# Patient Record
Sex: Female | Born: 1955
Health system: Southern US, Community
[De-identification: ages and names within clinical notes are randomized; demographics above are authoritative.]

## PROBLEM LIST (undated history)

## (undated) DIAGNOSIS — I10 Essential (primary) hypertension: Secondary | ICD-10-CM

## (undated) DIAGNOSIS — E7800 Pure hypercholesterolemia, unspecified: Secondary | ICD-10-CM

## (undated) DIAGNOSIS — K5792 Diverticulitis of intestine, part unspecified, without perforation or abscess without bleeding: Secondary | ICD-10-CM

## (undated) DIAGNOSIS — K219 Gastro-esophageal reflux disease without esophagitis: Secondary | ICD-10-CM

## (undated) DIAGNOSIS — K922 Gastrointestinal hemorrhage, unspecified: Secondary | ICD-10-CM

## (undated) HISTORY — PX: COLOSTOMY REVERSAL: SHX5782

## (undated) HISTORY — DX: Pure hypercholesterolemia, unspecified: E78.00

## (undated) HISTORY — PX: COLOSTOMY: SHX63

## (undated) HISTORY — PX: APPENDECTOMY: SHX54

## (undated) HISTORY — PX: OTHER SURGICAL HISTORY: SHX169

## (undated) HISTORY — PX: CHOLECYSTECTOMY: SHX55

## (undated) HISTORY — DX: Diverticulitis of intestine, part unspecified, without perforation or abscess without bleeding: K57.92

---

## 1999-12-20 ENCOUNTER — Encounter: Payer: Self-pay | Admitting: Emergency Medicine

## 1999-12-20 ENCOUNTER — Emergency Department (HOSPITAL_COMMUNITY): Admission: EM | Admit: 1999-12-20 | Discharge: 1999-12-20 | Payer: Self-pay

## 2002-12-14 ENCOUNTER — Other Ambulatory Visit: Admission: RE | Admit: 2002-12-14 | Discharge: 2002-12-14 | Payer: Self-pay | Admitting: Family Medicine

## 2002-12-27 ENCOUNTER — Encounter: Admission: RE | Admit: 2002-12-27 | Discharge: 2002-12-27 | Payer: Self-pay | Admitting: Family Medicine

## 2003-01-02 ENCOUNTER — Encounter
Admission: RE | Admit: 2003-01-02 | Discharge: 2003-04-02 | Payer: Self-pay | Admitting: Physical Medicine and Rehabilitation

## 2003-04-03 ENCOUNTER — Encounter
Admission: RE | Admit: 2003-04-03 | Discharge: 2003-07-02 | Payer: Self-pay | Admitting: Physical Medicine and Rehabilitation

## 2003-07-03 ENCOUNTER — Encounter
Admission: RE | Admit: 2003-07-03 | Discharge: 2003-10-01 | Payer: Self-pay | Admitting: Physical Medicine and Rehabilitation

## 2003-10-08 ENCOUNTER — Encounter
Admission: RE | Admit: 2003-10-08 | Discharge: 2003-12-02 | Payer: Self-pay | Admitting: Physical Medicine and Rehabilitation

## 2003-10-09 ENCOUNTER — Ambulatory Visit: Payer: Self-pay | Admitting: Physical Medicine and Rehabilitation

## 2003-12-02 ENCOUNTER — Encounter
Admission: RE | Admit: 2003-12-02 | Discharge: 2004-03-01 | Payer: Self-pay | Admitting: Physical Medicine and Rehabilitation

## 2003-12-04 ENCOUNTER — Ambulatory Visit: Payer: Self-pay | Admitting: Physical Medicine and Rehabilitation

## 2004-02-07 ENCOUNTER — Ambulatory Visit: Payer: Self-pay | Admitting: Physical Medicine and Rehabilitation

## 2004-03-27 ENCOUNTER — Ambulatory Visit: Payer: Self-pay | Admitting: Internal Medicine

## 2004-03-29 ENCOUNTER — Inpatient Hospital Stay (HOSPITAL_COMMUNITY): Admission: EM | Admit: 2004-03-29 | Discharge: 2004-04-01 | Payer: Self-pay | Admitting: Emergency Medicine

## 2004-03-31 ENCOUNTER — Encounter (INDEPENDENT_AMBULATORY_CARE_PROVIDER_SITE_OTHER): Payer: Self-pay | Admitting: Specialist

## 2005-09-23 ENCOUNTER — Encounter: Admission: RE | Admit: 2005-09-23 | Discharge: 2005-09-23 | Payer: Self-pay | Admitting: Family Medicine

## 2005-12-08 ENCOUNTER — Ambulatory Visit: Payer: Self-pay

## 2007-11-15 ENCOUNTER — Encounter: Admission: RE | Admit: 2007-11-15 | Discharge: 2007-11-15 | Payer: Self-pay | Admitting: Family Medicine

## 2008-04-25 ENCOUNTER — Encounter: Admission: RE | Admit: 2008-04-25 | Discharge: 2008-04-25 | Payer: Self-pay | Admitting: Family Medicine

## 2010-06-05 NOTE — Assessment & Plan Note (Signed)
[name] is a 55 year old, married, white female who is being followed in  our pain and rehabilitative clinic today for chronic leg pain, wrist pain,  and intermittent back pain.  She has been quite stable taking 5 mg of  methadone every day as well as Neurontin.  She stays very functional.  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  She is sometimes on  her feet for five hours at a time.  She is independent with all of her ADLs.  She is independent with her mobility.  She is able to drive.  Overall, her  sleep is good.  Denies any suicidal ideation.   Only recent change in her medical history is increased cholesterol which her  primary care physician is watching and may start her on some medication.   She was given some Elavil at the last visit.  She has had it filled but has  not taken it, has some concerns regarding it, has some questions.  I  answered them today in clinic.   PHYSICAL EXAMINATION:  VITAL SIGNS:  She has a blood pressure of 110/57,  pulse is 64, respirations 14, 99% saturated on room air.  AFFECT:  Alert, oriented.  She was dressed well.  She is cooperative.  MUSCULOSKELETAL:  She is able to get out of her chair without difficulty.  Her gait in the room is unchanged from the last two visits.  She has just a  slight foot drop on the left when she walks.  Seated, her reflexes are 2+ at  the knees and ankles.  Toes are downgoing.  No clonus is noted.  She has  diminished sensation in the posterior aspect of her right leg as well as the  left leg, however, the right leg is worse than the left.  She has diminished  dorsiflexion on the left as well.  She is able to get it just almost to  neutral, not quite with dorsiflexion.  The rest of her motor strength is 5/5  with the exception of both right and left EHL is about 4- to 4 over 5.   IMPRESSION:  Status post fall, December 2000, with multiple orthopedic  injuries and neurologic sequelae including  neuropathic lower extremity  symptoms bilaterally, mild left foot drop, no assistive device is needed,  decreased right wrist mobility and shoulder function.   PLAN:  1.  We will refill her methadone 5 mg one p.o. every day.  2.  She is almost out of her Neurontin.  We will go ahead and refill that      for her 300 mg one p.o. b.i.d., #60.  3.  She does not need any refill on the Elavil today.  She has not taken it.      She had some concerns about constipation and I asked her to consider      Dulcolax two tablets at night a couple times a week.  She is going to      give this a try apparently.  4.  We will see her back then in one month.       DMK/MedQ  D:  12/04/2003 12:20:15  T:  12/04/2003 14:55:33  Job #:  [id number]

## 2010-06-05 NOTE — Assessment & Plan Note (Signed)
MEDICAL RECORD NUMBER:  [id number]   DATE OF BIRTH:  1955/11/24   DATE OF SERVICE:  August 07, 2003   Brandy Haley is a 55 year old married white female who is being seen in our  Pain and Rehabilitation Clinic for pain management of multiple orthopedic  injuries sustained after a 35 foot fall back in December of 2001.  She was  last seen July 05, 2003.   She is here for a refill of her methadone.   She has no new pain complaints.  She reports that her average pain is about  a 4 on a scale of 10 and this has been well controlled with methadone.   Her functional status is quite good.  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  Overall stays quite active.  She is currently happy with her  current pain management program of methadone 5 mg 1 p.o. b.i.d.   PHYSICAL EXAMINATION:  GENERAL:  The patient is alert and oriented, pleasant  and cooperative, and in no apparent distress.  VITAL SIGNS:  Her blood pressure is 115/70, pulse of 85, respirations 14,  99% saturated on room air.   She is able to get off the exam table easily.  She walks in the room, has a  mild left foot drop which is unchanged from several previous exams.  No new  sensory or motor deficits.  Neurologic exam is stable.   IMPRESSION:  1. Status post fall December, 2001 with multiple orthopedic injuries and     neurologic sequelae.  2. Decreased right wrist mobility and decreased right shoulder function.  3. Lower extremity neuropathic symptoms.  4. Left foot drop, mild.   MEDICATIONS:  Continue Neurontin 600 mg q.h.s. and 300 q.a.m.  Methadone 5  mg 1 p.o. daily.  Lidoderm p.r.n.   PLAN:  Prescription for methadone 5 mg 1 p.o. daily #30 written for her  today.  Will see her back in 1 month.  This patient is currently stable on  this medication.      Brantley Stage, M.D.   DMK/MedQ  D:  08/07/2003 17:16:31  T:   08/07/2003 19:08:17  Job #:  [id number]

## 2010-06-05 NOTE — H&P (Signed)
Brandy Haley, Brandy Haley                ACCOUNT NO.:  [id number]   MEDICAL RECORD NO.:  [id number]          PATIENT TYPE:  INP   LOCATION:  5504                         FACILITY:  [facility]   PHYSICIAN:  Kela Millin, M.D.DATE OF BIRTH:  06/20/1955   DATE OF ADMISSION:  03/28/2004  DATE OF DISCHARGE:                                HISTORY & PHYSICAL   PRIMARY CARE PHYSICIAN:  Unassigned.   CHIEF COMPLAINT:  Epigastric/upper abdominal pain.   HISTORY OF PRESENT ILLNESS:  The patient is a 55 year old white female with  a history of hypertension and chronic pain who presents with the above  complaints. The patient states that the pain felt like pressure in her  epigastric area going up to her ribs. She tried taking Gas-X and Phazyme  without any relief. She states that the pain lasted from about 10 a.m. to  about 7 p.m. and then subsided on its own. She admits to nausea and vomiting  times four to five episodes and also about four episodes of watery  stool/diarrhea. She denies melena, hematochezia. The patient also denies  cough, fever, or dysuria.   The patient was seen in the ER and abdominal ultrasound showed  cholelithiasis and gallbladder wall thickening suspicious for acute  cholecystitis, no biliary dilatation. The ER physician consulted surgery as  well as gastroenterology and wanted the patient to be admitted to the [facility] service for further evaluation and management. In the ER the  patient declined any narcotic medication stating that she had just gotten  off methadone and did not want to be given narcotics at this time. When I  saw her in the ER she appeared comfortable and again stated that she did not  want any narcotic medications at this time. She is admitted to the [facility] service for further evaluation and management.   PAST MEDICAL HISTORY:  As stated above.   MEDICATIONS:  1.  Toprol ? 25 mg p.o. daily.  2.  Lotrel 40 mg p.o. daily.  3.   Neurontin 300 mg p.o. b.i.d.--patient also states that she was weaning      herself off the Neurontin.   ALLERGIES:  NKDA.   SOCIAL HISTORY:  She denies alcohol and also denies tobacco.   FAMILY HISTORY:  Her mother had cancer, heart disease, and diabetes  mellitus, now deceased.   REVIEW OF SYSTEMS:  As per HPI. Other comprehensive review of systems  negative.   PHYSICAL EXAMINATION:  GENERAL: The patient is a middle-aged white female,  sleepy, easily aroused, in no acute distress.  Appears comfortable.  VITAL SIGNS: Temperature 99.2, blood pressure 113/69, pulse 101, respiratory  rate 20, O2 saturation 98%.  HEENT: PERRL. EOMI. Slightly dry mucous membranes.  NECK: Supple. No adenopathy. No thyromegaly.  LUNGS: Clear to auscultation bilaterally. No crackles or wheezes.  CARDIOVASCULAR: Regular rate and rhythm. Normal S1 and S2.  ABDOMEN: Soft, right upper quadrant tenderness. No rebound tenderness. Bowel  sounds present, nondistended, and no masses palpable.  EXTREMITIES: No cyanosis and no edema.  NEUROLOGIC: Sleepy, but easily aroused, oriented, times three,  nonfocal  exam.   LABORATORY DATA:  Abdominal ultrasound--cholelithiasis and gallbladder wall  thickening suspicious for acute cholecystitis, no biliary dilatation. Her  AST is 488, ALT 1076. Creatinine 0.6, BUN 11, glucose 129, CO2 25, chloride  105, potassium 3.9, sodium 137, total protein 7.4, albumin 3.9, and alkaline  phosphatase 82, total bilirubin 2.1. Serum amylase 105, lipase 34.  Urinalysis reveals specific gravity  1.016, pH 6.5, small leukocyte  esterase, nitrites negative, urine wbc's 3-6, many bacteria.   ASSESSMENT/PLAN:  1.  Cholelithiasis/cholecystitis--a 55 year old white female presenting with      epigastric pain, physical exam with right upper quadrant tenderness,      patient also with associated nausea and vomiting, abdominal ultrasound      as stated above. Patient seen already by surgery. GI  states will see      patient in the a.m. Agree with antibiotics--Zosyn. Will start Protonix,      antiemetics, and keep the patient NPO. Will also recheck CMET in the      a.m. Patient comfortable at this time and as discussed above does not      want narcotic medications at this time as she states she just got off      narcotics about two weeks ago.  2.  Transaminitis/elevated liver function tests--agree secondary to #1,      recheck CMET in a.m.  3.  Diarrhea--check stool studies and follow.  4.  Hypertension--Vasotec IV p.r.n. while patient NPO. Follow and resume all      meds except Lopid.  5.  Chronic pain--as discussed above, patient does not want narcotics at      this time. She was on Neurontin as well. Will follow and restart when      patient able to take p.o.      ACV/MEDQ  D:  03/29/2004  T:  03/29/2004  Job:  [id number]

## 2010-06-05 NOTE — Op Note (Signed)
NAMECRISS, Brandy Haley                ACCOUNT NO.:  [id number]   MEDICAL RECORD NO.:  [id number]          PATIENT TYPE:  INP   LOCATION:  5502                         FACILITY:  [facility]   PHYSICIAN:  Velora Heckler, MD      DATE OF BIRTH:  Sep 04, 1955   DATE OF PROCEDURE:  03/31/2004  DATE OF DISCHARGE:                                 OPERATIVE REPORT   PREOPERATIVE DIAGNOSIS:  Cholelithiasis, chronic cholecystitis, rule out  choledocholithiasis.   POSTOPERATIVE DIAGNOSIS:  Cholelithiasis, chronic cholecystitis.   PROCEDURE:  Laparoscopic cholecystectomy with intraoperative  cholangiography.   SURGEON:  Velora Heckler, MD   ANESTHESIA:  General.   ESTIMATED BLOOD LOSS:  Minimal.   PREPARATION:  Betadine.   COMPLICATIONS:  None.   INDICATIONS:  The patient is a 55 year old white female admitted on the  medical service March12 with abdominal pain, nausea and vomiting. Ultrasound  demonstrated gallstones with thickened gallbladder wall. There was a dilated  common bile duct. Laboratory studies showed elevated liver function tests.  The patient was admitted on the medical service and seen in consultation by  surgery. She was seen by gastroenterology. Over the ensuing 48 hours, her  liver function test improved. It was felt that she had likely passed a  common bile duct stone. Therefore, she is now prepared and brought to the  operating room for cholecystectomy.   BODY OF REPORT.:  The procedure was done in operating room #17 at the [facility].  The patient was brought to the operating room,  placed in supine position on the operating room table. Following  administration of general anesthesia, the patient was prepped and draped in  usual strict aseptic fashion. After ascertaining that an adequate level of  anesthesia been obtained, an infraumbilical incision was reopened with a #15  blade. Dissection was carried down to the fascia. Fascia was incised in the  midline. The peritoneal cavity was entered cautiously.  0 Vicryl pursestring  suture was placed in the fascia.  A Hasson cannula was introduced and  secured with a pursestring suture. Abdomen was insufflated with carbon  dioxide. Laparoscope was introduced and the abdomen explored. Operative  ports were placed along the right costal margin, the midline, midclavicular  line, the anterior axillary line. Fundus of the gallbladder was grasped and  retracted cephalad. There are omental adhesions to the undersurface of the  gallbladder. These were gently taken down with blunt dissection and  hemostasis obtained with electrocautery. Dissection was carried down to the  neck of the gallbladder. Cystic duct was dissected out along its length. A  clip was placed at the neck of the gallbladder. Cystic duct was incised. A  yellow mucoid material was expressed from the cystic duct. A Cook  cholangiography catheter was introduced through a stab wound in the right  upper quadrant. It was inserted into the cystic duct and secured with a  ligature clip. Using C-arm fluoroscopy, real time cholangiography was  performed. There is rapid filling of a mildly dilated common bile duct.  There does not appear to be  any filling defects. There was free flow of  contrast distally into the duodenum. There was reflux of contrast into the  right and left hepatic ductal systems. Clip was removed and the [facility]  catheter was withdrawn from the peritoneal cavity. Cystic duct was clipped  with four Ligaclips and divided. Cystic artery was dissected out, doubly  clipped, and divided. Gallbladder was then excised from the gallbladder bed  using the hook electrocautery for hemostasis. Gallbladder was extracted  through the umbilical port without difficulty. On palpation it contains tiny  gallstones measuring only a few millimeters. Good hemostasis was noted in  the right upper quadrant. Abdomen was irrigated with warm saline which  was  evacuated. Ports were removed under direct vision.  0 Vicryl pursestring  suture was tied at the umbilicus. Pneumoperitoneum was released. Port sites  were anesthetized with local anesthetic. All wounds were closed with  interrupted 4-0 Vicryl subcuticular sutures. Wounds washed and dried.  Benzoin, Steri-Strips were applied. Sterile dressings were applied. The  patient was awakened from anesthesia and brought to the recovery room in  stable condition. The patient tolerated the procedure well.      TMG/MEDQ  D:  03/31/2004  T:  03/31/2004  Job:  [id number]

## 2010-06-05 NOTE — Assessment & Plan Note (Signed)
REASON FOR EVALUATION:  This is a recheck.  She is a 55 year old woman who  has a history of a 65- to 35-foot fall dating to December 2001.  She had  multiple orthopedic injuries.  She was seen at the last visit on February 06, 2003 for the following:  #1 - She has a mild foot drop on the left; #2 - she  has decreased right wrist mobility; #3 - mild right neurogenic bowel  dysfunction; #4 - decreased left shoulder function; #5 - right lower  extremity radicular symptoms; #6 - achy foot pain, left greater than right.   Overall, Ms. Groner says she has been doing fairly well.  Her right lower  extremity radicular symptoms do not really bother her unless she has been  standing for more than 4 hours and then after that, she sits down and she  feels more comfortable again.  Her right wrist has not bothered her that  much.  She is set up to see Dr. Wendall Papa at [facility] for further  surgery, should she desire to improve its function and decrease pain,  however, she is currently not interested in pursuing any further surgical  interventions at this time.  She had a prescription for Naprosyn at the last  visit; she did not fill it; she did not feel that her pain warranted her  taking any more medication than she currently is.  She is taking the  Neurontin, currently 2 in the morning and 2 in the evening, 300 mg, and she  is tolerating this well.  She has noticed it made her somewhat more tired,  but she feels that it is helping her pain.  She still takes methadone 5 mg  daily and feels that this dose is adequate and feels that she is getting  adequate pain management from it.   PHYSICAL EXAMINATION:  VITAL SIGNS:  On exam, she has a blood pressure of  99/61, pulse of 74, respirations 16, 100% saturated on room air.  GENERAL:  She is pleasant and alert and well-groomed.  She is cooperative  and appropriate.  NEUROMUSCULAR:  She is able to get off the exam table and walk in the room.  Her gait is essentially normal with the exception of a mild left foot drop.  Seated, she has intact reflexes in the upper and lower extremities, 2+  throughout.  Toes are downgoing.  No clonus is noted.  She has about 90  degrees of shoulder abduction, 110 degrees of forward flexion, 90 degrees of  external rotation and 0 degrees of internal rotation, and this is the left  shoulder.  Her left wrist has approximately 5 degrees of extension and  between 5 and 10 degrees of flexion.  She appears to have full pronation and  supination.   Motor strength was evaluated; she has 3+ bilateral EHL, 4- dorsiflexors on  the left; the rest of her extremity exam including hip flexors, knee  extensors, dorsiflexors on the right and plantarflexors are all 5/5.  Sensory exam is unchanged from last exam.   IMPRESSION:  1. Status post 26- to 35-foot fall, December 19, 1999, with multiple     orthopedic injuries:     a. Mild left foot drop.     b. Decreased right wrist mobility.     c. Mild neurogenic bowel.  She reports no further problems with her        bowels.  She has full function and currently not  at all constipated        and feels that she has had a return of normal bowel function.     d. Decreased left shoulder function.  The patient has essentially no        internal rotation and is limited in abduction.     e. Right lower extremity radicular symptoms overall have improved and        pain is being adequately treated with Neurontin and methadone        currently.     f. Achy feeling in left and right foot, worse on left than right.  The        patient will be given some Lidoderm patches and explained their use.   PLAN:  The following prescriptions were written:  1. Methadone 5 mg 1 p.o. daily.  2. Lidoderm __________ units -- apply up to 3 patches to affected areas, 12     hours on, 12 hours off, #__________ units.  3. Neurontin 300 mg 2 p.o. b.i.d., #124 units.   We will see her back in 1  month.      Brantley Stage, M.D.   DMK/MedQ  D:  03/08/2003 17:32:38  T:  03/09/2003 04:57:27  Job #:  [id number]

## 2010-06-05 NOTE — Assessment & Plan Note (Signed)
Audio too short to transcribe (less than 5 seconds)      Brantley Stage, M.D.   DMK/MedQ  D:  03/08/2003 17:24:53  T:  03/08/2003 17:25:55  Job #:  [id number]

## 2010-06-05 NOTE — Assessment & Plan Note (Signed)
MEDICAL RECORD NUMBER:  [id number].   REFERRING PHYSICIAN:  Dr. Nathanial Rancher.   Brandy Haley is a 55 year old, married, white female who is being seen in our  pain and rehabilitative clinic today for a refill of her medications.   She has a history of multiple orthopedic injuries sustained after a 35-foot  fall back in December of 2001.  She was last seen in our clinic on August 06, 2003.  She is here for a refill of her methadone.   She reports no new complaints today.  She denies any new numbness, tingling,  weakness or bowel or bladder problems.  She is independent with all of her  self-care and mobility.  She is able to walk between 30 minutes and an hour  a day.  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  She  reports her pain varying from a 4 to a 7 on a scale of 10.   Overall sleep has been good.   Denies any suicidal ideation or depressive symptoms.   Methadone is currently being tolerated well.  Denies constipation.  Denies  oversedation.   On exam, the blood pressure is 100/66, pulse 70, respirations 18 and 99%  saturated on room air.  She is alert, bright, oriented x 3 and cooperative.   She is able to get out of the chair easily.  Gait in the room again  remarkable for foot drop on the left, which is mild.  She does not need an  AFO or any assistive device.  Seated reflexes are 2+ at the toes and ankles.  Toes are downgoing.  No clonus is noted.  Motor strength is unchanged from  last visit, essentially 5/5 throughout her lower extremities with the  exception of 4/5 at EHL and dorsiflexors on the left.   IMPRESSION:  1.  Status post fall in December of 2000 with multiple orthopedic injuries      and neurologic sequelae.  2.  Decreased right wrist mobility and decreased right shoulder function.  3.  Lower extremity neuropathic symptoms bilaterally.  4.  Left foot drop, which is mild.  No assistive device needed.   PLAN:  1.  Refill methadone 5 mg one p.o.  daily, #31.  2.  Will see her back in one month for recheck and refill of her      medications.  3.  She will call us if she has any problems in the interim.       DMK/MedQ  D:  10/09/2003 17:28:29  T:  10/10/2003 14:36:36  Job #:  [id number]
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
## 2010-06-05 NOTE — Discharge Summary (Signed)
NAMEMIRELLA, GUEYE                ACCOUNT NO.:  [id number]   MEDICAL RECORD NO.:  [id number]          PATIENT TYPE:  INP   LOCATION:  5502                         FACILITY:  [facility]   PHYSICIAN:  Hollice Espy, M.D.DATE OF BIRTH:  May 15, 1955   DATE OF ADMISSION:  03/29/2004  DATE OF DISCHARGE:  04/01/2004                                 DISCHARGE SUMMARY   CONSULTING PHYSICIANS:  1.  Velora Heckler, MD, [facility].  2.  Wilhemina Bonito. Marina Goodell, M.D.   DISCHARGE DIAGNOSES:  1.  Cholecystitis.  2.  Elevated transaminases secondary to choledocholithiasis.  3.  Hypertension.  4.  History of pelvic fracture.  5.  History of vertebral fracture.  6.  Chronic pain.   DISCHARGE MEDICATIONS:  The patient will resume all of her previous  medications:  1.  Toprol.  2.  Lotrel.  3.  Neurontin.  4.  Ultracet.  5.  Aspirin.  6.  She is also receiving a prescription for Vicodin (total #20) one to two      p.o. q.4h. p.r.n. pain.   HOSPITAL COURSE:  This is a 55 year old white female who presented with  epigastric pain, nausea, vomiting, and diarrhea x4-5 episodes prior to  coming in.  An abdominal ultrasound showed cholelithiasis, gallbladder wall  thickening and cholecystitis.  Surgery and GI were consulted.  The patient,  on admission, was known to have markedly elevated AST and ALT and 1488 and  1076, respectively, with a bilirubin of 2.1.  After evaluation by surgery  and GI, it was felt that patient likely had passed a stone through the  common bile duct and continued to show signs of improvement following  passing of the stone.  She was started on IV antibiotics.  She did well,  with pain well controlled.  By 03/30/04, her LFTs had trended down to 269 and  579, respectively, and she was planned for a laparoscopic cholecystectomy,  which was done on March 31, 2004.  The patient tolerated it well.  The  patient had the procedure done by Dr. Gerrit Friends, of [facility],  on March 31, 2004.  She had no complication.  Status post, she was doing  well.  She was tolerating p.o.  By April 01, 2004, she was had no pain,  tolerating normal solid foods.  Her repeat CMET had shown an even further  decrease in her transaminases with an AST of 79 and an ALT of 307, and  normal alkaline phosphatase.  At this point, it was felt the patient was  medically stable for  discharge.  The plan will be for the patient to be discharged to home.  Her  overall disposition is improved.  Activity will be as tolerated.  Her diet  is a regular, low-sodium diet.  She will follow up with Dr. Gerrit Friends, in his  office.  A number has been made for her.  She will also follow up with her  PCP in the next two to four weeks.      SKK/MEDQ  D:  04/01/2004  T:  04/01/2004  Job:  [id number]   cc:   Velora Heckler, MD  [address]  [place]  [place] [address]   Wilhemina Bonito. Marina Goodell, M.D. [facility]

## 2010-06-05 NOTE — Assessment & Plan Note (Signed)
REASON FOR EVALUATION:  She is back in for recheck today.  She is a 55-year-  old who fell 30 to 35 feet in December of 2001 and sustained multiple  orthopedic injuries.  Functional deficits include for her:  #1 - Mild left  foot drop with pain with prolonged ambulation, #2 - diminished right wrist  mobility, #3 - mild neurogenic bowel function, #4 - limited left shoulder  function.   The patient had recently stopped her Celebrex and has noted an increased  pain in the posterior aspect of her right leg with the discontinuation of  the Celebrex.  Otherwise, overall she has been doing well.  She continues to  tolerate the methadone and only takes 5 mg per day.  At this point, with her  new pain, she is reluctant to want to reduce her doses of the methadone at  this time.  She is also on Neurontin, which she takes only 300 mg q.p.m.  Other medications that she is currently taking include Bayer Aspirin 81 mg  daily,  Lotrel 5 to 10 mg daily, Toprol 25 mg daily as well as a  multivitamin.   She reports her average pain is about a 4 on a scale of 10; the worst it  gets is about an 8 and currently in the office it is about a 4 today.  Her  pain does not typically interfere significantly with any of her household  activities; she is able to all of her activities of daily living; she cooks  and cleans and attends church and helps out in the family.  She stays quite  busy.   PHYSICAL EXAMINATION:  VITAL SIGNS:  On exam today, her blood pressure is  122/73, pulse 72, 100% saturation on room air.  GENERAL:  She appears comfortable and is appropriate and oriented x3 as she  talks to me today.  NEUROMUSCULAR:  She is able to get off the exam table easily.  Her gait in  the room is essentially normal with the exception of a mild foot drop on her  left side and seated, her reflexes are symmetric and intact, upper and lower  extremities.  Toes are downgoing.  No clonus is noted.  She has no sensory  deficits.  Motor strength is in the 5/5 range with the exception of the left  dorsiflexors and EHL.   IMPRESSION:  Overall, Ms. Ell is doing quite well.  She does have some  mild radicular-type symptoms into the right leg today.  No obvious changes  on exam are noted.  She noticed the pain after the Celebrex was  discontinued.  We will start her on Naprosyn 500 mg p.o. b.i.d.  We will  increase her Neurontin to 300 mg t.i.d.  We are considering decreasing her  methadone, but at this point, we will just leave it at 5 mg daily.  Her  methadone was not refilled; she has about 3 weeks left.  She will call us  several days before she runs out of her methadone and we will write a  prescription and she can come pick it up then here at the clinic.  She was  written a prescription for Naprosyn 500 mg p.o. b.i.d., #60, today.  We will  see her back in approximately 1 month and reevaluate her at that time.  If  her radicular pain worsens or is becoming a problem for her, may consider  doing further evaluations of her lumbar area, x-rays and either  CT or MRI,  depending on what the x-rays show.      Brantley Stage, M.D.   DMK/MedQ  D:  02/06/2003 17:56:23  T:  02/07/2003 05:12:51  Job #:  [id number]

## 2010-06-05 NOTE — Consult Note (Signed)
Brandy Haley, Brandy Haley                ACCOUNT NO.:  [id number]   MEDICAL RECORD NO.:  [id number]          PATIENT TYPE:  INP   LOCATION:  5502                         FACILITY:  [facility]   PHYSICIAN:  Ollen Gross. Vernell Morgans, M.D. DATE OF BIRTH:  September 05, 1955   DATE OF CONSULTATION:  DATE OF DISCHARGE:                                   CONSULTATION   Brandy Haley is a 55 year old white female who presents to the emergency  department tonight with epigastric pain that has been going on for the last  two weeks.  This has been accompanied with nausea and vomiting.  She has  felt like she has run some low grade fevers at home.  She denies any chest  pain, shortness of breath, diarrhea, dysuria.  Rest of her review of systems  is unremarkable.   PAST MEDICAL HISTORY:  1.  Hypertension.  2.  Previous fall from 30 feet.   PAST SURGICAL HISTORY:  Abdominal exploration secondary to her fall.   MEDICATIONS:  1.  Toprol.  2.  Lotrel.   ALLERGIES:  No known drug allergies.   SOCIAL HISTORY:  She has no use of alcohol and tobacco products.   FAMILY HISTORY:  Noncontributory.   PHYSICAL EXAMINATION:  VITAL SIGNS:  Temperature 99.2, blood pressure  113/69, pulse 100.  GENERAL:  She is a well-developed, well-nourished white female in no acute  distress.  SKIN:  Warm and dry with no real jaundice.  HEENT:  Eyes:  Extraocular movements are intact.  Pupils are equal, round,  and reactive to light.  Sclerae nonicteric.  LUNGS:  Clear bilaterally with no use of accessory respiratory muscles.  HEART:  Regular rate and rhythm with no pulse in the left chest.  ABDOMEN:  Soft and nontender with no palpable mass or hepatosplenomegaly.  EXTREMITIES:  No clubbing, cyanosis, edema.  PSYCHOLOGIC:  Alert and oriented x3 with no state of anxiety or depression.   On review of her laboratory work her white count was noted to be normal at  6.2.  Her liver functions were elevated.  Total bilirubin was 2.1, alkaline  phosphatase 82, SGOT 1488, SGPT 1076.  Her ultrasound was reviewed with the  radiologist and showed some thickening of the gallbladder wall, gallstones,  and a dilated common bile duct.   ASSESSMENT/PLAN:  This is a 55 year old white female with probable  cholecystitis or cholelithiasis and a possible obstructing common duct stone  with elevated liver functions.  She may require ERCP to clear her common  bile duct once her liver function tests are improved then she may benefit  from cholecystectomy during this admission.  Given her previous surgery, she  may or may not be a candidate for laparoscopic cholecystectomy but we  will follow closely with you.  We would agree with covering her with broad-  spectrum antibiotics for the cholecystitis and urinary tract infection and  again we will follow closely with you.  Appreciate the opportunity to help  in care for this patient.      PST/MEDQ  D:  03/29/2004  T:  03/30/2004  Job:  [id number]

## 2010-06-05 NOTE — Assessment & Plan Note (Signed)
MEDICAL RECORD NUMBER:  [id number].   Brandy Haley  is a 55 year old white female who is being seen in our pain and  rehabilitative clinic today primarily for bilateral lower extremity  neuropathic pain as well as right rib pain. She is status post a fall  December 2000. Has had a history of multiple orthopedic injuries and  neurologic sequelae including mild left foot drop and right wrist deformity.   Her average pain is about a 4 on a scale of 10. Currently is about a 2.  Sleep is overall good. Her relief from medications is fair. Pain is worse  with walking and standing and sitting. Improves with rest and medications.  Pain is described as sharp, burning, dull, stabbing, aching variably.   The patient is up on her feet most of the day. She is able to climb stairs  to drive. 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 She has had  some recent problems with diarrhea. She had decreased her methadone. She has  skipped one of her doses and developed some overt diarrhea and a runny nose.  It sounds like she had some mild withdraw symptoms. After taking her  methadone again, she had improvement of her symptoms.   She has been plagued intermittently by constipation. Has tried Dulcolax. Two  tablets might be too strong for her. She has tried adding more fiber into  her diet and at this point is not really requesting any sort of advice  regarding her bowel program and will let me know next month how she is  doing.   PHYSICAL EXAMINATION:  On exam today, blood pressure 97/60, pulse 76,  respirations 20, 99% saturated on room air. She is alert and oriented, well-  dressed, cooperative female oriented x3. Affect is overall bright and alert.  Gait:  Does have diminished dorsi flexion on the left with her gait. No  problem with overt toe catching. She has reasonable range of motion in her  lumbar spine without pain today. Cervical range of motion is certainly quite  functional as well. She  has full shoulder range of motion. Seated reflexes  are 2+ at the knees and ankles. Toes are downgoing. No abnormal tone or  clonus is noted. Again, decreased strength of the left dorsi flexor. Right  wrist also shows obvious deformity and diminished motion. She is able to get  to about neutral with extension, not beyond that though.   IMPRESSION:  Status post fall December 2000 with multiple orthopedic  injuries. Neurologic sequelae including neuropathic lower extremity symptoms  bilaterally, mild left foot drop, decreased right wrist mobility.   PLAN:  Will refill methadone 5 mg _________________ . She may try to halve  some of these and take 2.5 if she can tolerate it. Does not need any other  refills today. We will see her back in one month. She has been quite stable  on this dose for several months now.       DMK/MedQ  D:  02/07/2004 12:55:59  T:  02/07/2004 14:21:47  Job #:  [id number]

## 2010-06-05 NOTE — Assessment & Plan Note (Signed)
BRIEF HISTORY:  This is a recheck.  She is a 55 year old woman with a  history of 66- to 35-foot fall dating to December 2001 and history of  multiple orthopedic injuries.  She was last seen March 08, 2003.  She has  the following orthopedic problems:  (1) Mild right foot drop on the left,  (2) decreased right wrist mobility, (3) history of mild neurogenic bowel  dysfunction, (4) decreased left shoulder function, (5) lower extremity  radicular symptoms, (6) left achy foot pain greater on left than right.   Ms. Burdett tells me her function has been good.  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  She  denies any new problems with bowel or bladder.  She denies any new injuries  or falls.  She denies any new numbness, tingling, or weakness in her lower  extremities.  She has been taking methadone 5 mg once daily, this has been  helpful in keeping her functional.  She has learned to do some pacing and  does take breaks throughout the day when her feet begin to hurt.  She has  been taking Neurontin 300 mg two tablets p.o. b.i.d. and denies any problems  with balance but does report some increased sleepiness but it has not gotten  in the way of her function significantly.  I wrote her a prescription for  Naprosyn, she reports she did not fill it, she has not felt the need to.   PHYSICAL EXAMINATION:  On exam today she is a well-dressed cooperative  optimistic woman.  She does not appear in any distress during our interview.  She is able to get out of her chair easily.  She walks in the room and she  has decreased dorsiflexion on left and has a very mild left foot drop.  Her  sensory exam is unchanged.  Her motor strength is unchanged from the last  exam.   IMPRESSION:  1. Mild left foot drop.  2. Decreased right wrist mobility.  3. Decreased left shoulder function.  4. Right lower extremity radicular symptoms.   PLAN:  We will  continue methadone 5 mg one p.o. once daily.  We will  continue the Lidoderm patch and she does not need anymore at this time.  She  will also continue the Neurontin which is 600 mg twice a day.  Being on the  Neurontin may have her family doctor check her CBC as one of the side  effects which is rare would be leukopenia, may consider this at the next  visit if she has not had one done recently.  We also talked about having her  split the methadone 2.5 in the morning and 2.5 in the evening to give her a  little more coverage during the evening, she will try this, if this does not  work well for her may continue her on the 5 mg in the morning and possibly  add some Ultram for the evening.  We will see her back then in 1 month.      Brantley Stage, M.D.   DMK/MedQ  D:  04/05/2003 18:36:56  T:  04/06/2003 15:03:28  Job #:  [id number]   cc:   Sharman Crate L. Hamrick, M.D.  [facility]

## 2010-06-05 NOTE — Assessment & Plan Note (Signed)
Brandy Haley is a 55 year old woman with a history of a 30-35-foot fall in  December of 2001, who has a history of multiple orthopedic injuries.  She is  back in our pain and rehabilitative clinic for a recheck today.  She  continues to function at a very high level.  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  Her function is  limited mainly by her right shoulder, which is only able to abduct to  approximately 90 degrees and her right wrist, which has approximately 10  degrees of dorsiflexion and palmar flexion.   She has recently attempted to decrease her methadone.  Over the last two  weeks, she has taken it every other day.  She has taken the Ultracet in the  evening to help with pain.  She finds it to be beneficial.  Her average pain  is about a 4/5 on a scale of 10.   MEDICATIONS:  Medications at this time include:  1. Neurontin 600 mg twice a day.  2. Lotrel daily.  3. Toprol XL 25 mg daily.  4. An aspirin a day.  5. One vitamin daily.  6. Ultracet two tablets p.o. q.h.s.  7. Methadone 5 mg daily.   PHYSICAL EXAMINATION:  VITAL SIGNS:  Her blood pressure is 120/69, pulse 80,  respirations 16, 99% saturated on room air.  GENERAL APPEARANCE:  She is alert.  She is oriented.  She is cooperative,  well dressed, and appropriate.  HEART:  In regular rhythm.  LUNGS:  Clear.  ABDOMEN:  Benign.  NEUROLOGIC:  Gait reveals a mild left foot drop, not as prominent as at last  visit.  She reports she is not as tired.  She has 90 degrees of abduction in  the right shoulder and 100 degrees of forward flexion.  She has limited  wrist flexion and extension, approximately 10 degrees of flexion and 10  degrees of extension.  Reflexes 0 at the right brachial radial, 2 at the  biceps on the right, and 2 at the triceps on the right.  The left biceps,  triceps, and brachial  radialis are all 2.  Patellar tendons are 2  bilaterally.  The Achilles' tendon is 2 bilaterally.  The toes are  downgoing.  No clonus is noted.  Motor strength is 5/5 in the lower  extremities with the exception of the left dorsiflexion.   IMPRESSION:  1. Status post 30-35-foot fall in December of 2001.  2. Decreased right wrist mobility.  3. Decreased right shoulder function.  4. Lower extremity neuropathic symptoms.  5. Mild left foot drop.   PLAN:  1. Continue methadone 5 mg p.o. every other day.  2. Ultracet two p.o. t.i.d. p.r.n.  3. Lidoderm p.r.n.  4. Neurontin 600 mg p.o. b.i.d.   Will her follow up with orthopedist for right wrist at her discretion.   Will consider decreasing Neurontin possibly in the early fall.  Will see her  back in one month.  She will call us if she has any problems.      Brantley Stage, M.D.   DMK/MedQ  D:  06/14/2003 13:05:06  T:  06/14/2003 13:39:31  Job #:  [id number]   cc:   Donny Pique  [facility]

## 2010-06-05 NOTE — Group Therapy Note (Signed)
REASON FOR CONSULTATION:  Pain control.   CHIEF COMPLAINT:  Patient reports pain in right wrist and pain in feet,  especially left foot.   HISTORY OF PRESENT ILLNESS:  Brandy Haley is a 55 year old very pleasant  white female who was referred to our clinic for pain management.  She had an  accident in December 2001 when she fell 30 to 35 feet from her home as she  was putting up Christmas decorations.  She sustained two pelvic fractures,  three lumbar disks were involved, also one in her neck, she had a fractured  sacrum.  She had a right shoulder fracture and is status post surgery and  pin placement.  She had a broken right wrist as well as three ribs.  After  hospitalization she was also noted to have a missed proximal humeral  fracture.  She had reconstructive surgery using bone marrow from her hip as  well as a bone stimulator.  Apparently the right humeral fracture was  finally healed as of June 2004.  She has had chronic problems with her right  wrist as well with decreased range of motion as well as pain.  Dr. Franklyn Lor is  her orthopedist who has told her he plans to do some operative procedure, I  am not sure if he is going to fuse it or do another type of procedure to  help her with the mobility and the pain.   Brandy Haley main complaints of pain include the right wrist which she  says bothers her, especially after activities such as peeling potatoes, it  can go up to a 5-6 on a scale of 10, typically it is around a 4 otherwise,  and anytime she uses the wrist quite a bit it starts to bother her a lot.   Her biggest complaint however, is her bilateral leg pain especially into the  left foot which she describes as a shooting or sharp-type pain which occurs  especially after prolonged activity such as walking and sometimes cold  weather.  She also continually gets an achiness into the feet as well.   She denies any new numbness or tingling.  She does have areas of numbness  along the posterior aspect of both legs.  She has not had any new weakness  over the last year, she does notice that when she has been walking for a  while her left toe tends to get caught on the ground and she can trip but  this is only after she has been up walking for a while.   Otherwise no new gait problems.  Again exacerbating factors typically are  increased activity, alleviating factors are rest and she also takes  methadone which she thinks is helping somewhat as well.  She denies having  any recent studies of her spine since the initial injuries.  She is  independent.   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  Her mobility is not significantly restricted.  She  can walk at least 2 hours when she is shopping.  Again the main problem is  her left dorsiflexors get weak after she has been up for a long time, denies  any problems with sleeping, notes that she does have a little bit of problem  with neurogenic bowels since the pelvic injury, mood is good, coping skills  appear to be quite adequate, and she is able to self-manage her pain from  this interview adequately.   ALLERGIES:  She denies any drug allergies.   MEDICATIONS:  Her current medications include Neurontin 300 mg twice a day,  Celebrex 200 mg once a day, methadone 5 mg once a day, Bayer Aspirin one  p.o. once daily, Lotrel 40 mg one once daily, Toprol once a day and on a  woman's vitamin once a day.   SURGERIES:  As noted in history.   REVIEW OF SYSTEMS:  Negative for any problems with fevers, chills, diabetes,  ulcers, cancer, kidney problems, bleeding disorders, allergies, asthma,  thyroid problems.  She does report she has high cholesterol.  She denies  harm to self or others, denies depression or anxiety, or use of illicit  drugs.   FAMILY HISTORY:  Patient reports heart disease, cancer, and diabetes in the  family.   SOCIAL HISTORY:  Patient is  married, has 28, 54, 52, and 39 year-old  children, the older three are in college, she has a 57 year old who lives at  home.  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  She  denies alcohol use, denies smoking use, denies illicit drug use.  She does  drink about a cup of caffeine a day.   PHYSICAL EXAMINATION:  Blood pressure was 110/66, pulse 69, O2 saturation  98% on room air.  She was a well-developed, well-nourished woman in no  apparent distress during our interview.  She was well groomed, appropriate,  cooperative, and cheerful.   Skin was remarkable for multiple well-healed surgical scars over her right  shoulder and right upper extremity.   Her gait in the room revealed a short stride length with no heel strike on  the left.  Her gait was steady, however, and she was able to walk on her  toes.  She was not able to rock back on her left heel.   She had forward flexion to about 70 degrees, extension was approximately 20  degrees in the lumbar spine and now lateral flexion was approximately 20  degrees bilaterally in the lumbar spine.  She had mild limitations in  cervical range of motion with rotation right and left.   Shoulder range of motion was limited to 90 degrees of abduction on the  right.  She also had limited internal rotation on the right.  The left upper  extremity was within normal limits.  Right wrist range of motion was also  quite limited.  Strength in the upper extremities was in the 5/5 range.  Wrist extensors were not tested due to limitations in motion.  Grip strength  was slightly decreased on the right compared to the left as well.  Motor  strength in the lower extremities was in the 5/5 range with the exception of  the right EHL which was about 3/5 and the left dorsiflexors were 4 to 4+/5.  Sensation was diminished over the left L5 dermatome otherwise intact  throughout both upper and lower extremities.  Peroneal sensation was not  tested at this visit.   Reflexes were tested in the upper extremity and were 2 to 3+ bilaterally at  the biceps, brachioradialis and triceps and they were about 2 to 3+ in the  lower extremities and symmetric at the patella tendons and Achilles tendons  bilaterally.  Toes are downgoing.  No clonus was appreciated today.   IMPRESSION:  This is a 55 year old who sustained a significant amount of  trauma approximately 2 years ago  after a fall which included pelvic  fractures, lumbar and cervical injuries as well as a right shoulder fracture  and right wrist involvement.   Functional deficits are in the areas of ambulation.  (1) She has a mild left  foot drop and pain with prolonged ambulation.  (2) Diminished right wrist  mobility.  (3) What appears to be a mildly neurogenic bowel function.  (4)  Limited left shoulder function.  Overall the patient remains functional in  her home and overall is doing what she wants to be doing.  Her pain control  is currently adequate with methadone 5 mg once daily.  She is wondering if  she can decrease that at some point in the future.  She is a little  uncertain whether over the holidays is the time to diminish her pain  medication.  I agree.  We will keep her on the 5 mg of methadone one p.o.  once daily (#31) units were written for.   PLAN:  We will have patient follow up in 4 weeks.  Methadone 5 mg one p.o.  once daily (#31) units were written with no refills.  We will consider  decreasing her methadone next month after assessing her pain control.     Brantley Stage, M.D.   DMK/MedQ  D:  01/04/2003 14:36:09  T:  01/04/2003 16:25:41  Job #:  [id number]   cc:   Dr. Susie Cassette, [place]   Dr. Franklyn Lor, [place]

## 2010-06-05 NOTE — Assessment & Plan Note (Signed)
REFERRAL SOURCE:  Mara Hamrick.   Brandy Haley is a 55 year old woman with a history of a 30-35 foot fall in  December of 2001 and a history of multiple orthopedic injuries.  She  presents to our pain and rehabilitative clinic for a recheck today.  She has  had the following orthopedic problems:  1.  Mild foot drop on the left.  She  reports that she does fairly well throughout the day, especially when she is  wearing a high heel.  Towards the end of the day, she notices that there is  a heaviness in the left foot and she does tend to have more a foot drop  towards the end of the day as she fatigues.  2.  Decreased right wrist  mobility.  She has not been real anxious to have this taken care of  surgically.  She recently has felt that she had seen enough doctors for a  while.  However, today she verbalizes that since she has been trying to be  more active, she had been doing some digging, she may consider having it  taken care of.  Will bring this up again in the next few visits as well.  3.  She has a history of mild neurogenic bowel dysfunction as she is not having  any major problems with this at this point.  She has an issue with her left  foot.  She gets a deep achiness in it.  She has some neuropathic pain in  both lower extremities, worse on the left than on the right.  The Neurontin  seems to be helping with this so far.   She continues to take methadone 5 mg q.a.m.  She did attempt to break them  in half over the last month without much success.  She is wondering if there  is something she can add in the evening to help her with her pain  management.  She does note that the pain that she has experienced in the  lower extremities has not been as sharp and shooting as it has been in the  last year.   She continues to use the Lidoderm.  This seems to be helpful.   MEDICATIONS:  Medications at this time include:  1. Neurontin 600 mg twice a day.  2. Lotrel.  3. Toprol.  4.  Aspirin.  5. A vitamin  6. Methadone 5 mg q.a.m.   PHYSICAL EXAMINATION:  On exam, she is a well-developed, well-nourished  woman in no apparent distress.  She is appropriate, cooperative, and  optimistic.   Her blood pressure is 107/59, pulse of 68, respirations 14, and 98%  saturated on room air.  She is able to get out of the chair easily.  She  walks in the room with a stable gait.  Her left foot does still have a  slight foot drop during her gait cycle.  She has good forward flexion,  extension, and lateral flexion with her lumbar spine.  Her right wrist is  limited in its motion.  She has about 10 degrees of flexion and 10 degrees  of extension.  She has otherwise normal tone and no tremors noted in any of  her extremities.  No mottling or color changes in either foot or her hands.  No edema is noted.  Mild tenderness in the right wrist is noted.  No joint  instability.  At this point, she has intact reflexes, 2+ at the biceps,  triceps, and brachial  radialis bilaterally, and 2+ at the knees and ankles  bilaterally.  Toes are downgoing.  No clonus is noted.  Sensory exam is  unchanged.  Motor strength is 5/5 in the upper and lower extremities with  slightly weaker 4/5 dorsiflexors on the left foot.   IMPRESSION:  1. Status post 30-35 foot fall in December of 2001 with persistent mild left     foot drop.  2. Decreased right wrist mobility.  3. Decreased left shoulder function.  4. Lower extremity neuropathic pain symptoms.   PLAN:  Will continue methadone 5 mg one p.o. daily.  Will start Ultracet two  p.o. q.h.s.  May consider increasing the Ultracet if she feels either  beneficial and discontinue the methadone at the next visit.  Will continue  the Lidoderm patch as needed.  Also discussed the Neurontin.  Possibly at  some point in the future may start to decrease her dose and see how she does  on it as we decrease the dose.  At this point, would not make too many  changes  in her medications.  Will also bring up possibly having her  reevaluated by the orthopedist for her right wrist.  Will see her back in  one month.      Brantley Stage, M.D.   DMK/MedQ  D:  05/08/2003 17:06:55  T:  05/08/2003 18:37:30  Job #:  [id number]   cc:   Mara Hamrick

## 2011-03-17 DIAGNOSIS — G8921 Chronic pain due to trauma: Secondary | ICD-10-CM | POA: Diagnosis not present

## 2011-03-17 DIAGNOSIS — I10 Essential (primary) hypertension: Secondary | ICD-10-CM | POA: Diagnosis not present

## 2011-03-17 DIAGNOSIS — E7800 Pure hypercholesterolemia, unspecified: Secondary | ICD-10-CM | POA: Diagnosis not present

## 2011-03-17 DIAGNOSIS — Z79899 Other long term (current) drug therapy: Secondary | ICD-10-CM | POA: Diagnosis not present

## 2011-09-15 DIAGNOSIS — E7800 Pure hypercholesterolemia, unspecified: Secondary | ICD-10-CM | POA: Diagnosis not present

## 2011-09-15 DIAGNOSIS — I10 Essential (primary) hypertension: Secondary | ICD-10-CM | POA: Diagnosis not present

## 2011-09-15 DIAGNOSIS — G8921 Chronic pain due to trauma: Secondary | ICD-10-CM | POA: Diagnosis not present

## 2011-10-05 ENCOUNTER — Inpatient Hospital Stay: Payer: Self-pay | Admitting: Internal Medicine

## 2011-10-05 DIAGNOSIS — D649 Anemia, unspecified: Secondary | ICD-10-CM | POA: Diagnosis not present

## 2011-10-05 DIAGNOSIS — Z8781 Personal history of (healed) traumatic fracture: Secondary | ICD-10-CM | POA: Diagnosis not present

## 2011-10-05 DIAGNOSIS — I10 Essential (primary) hypertension: Secondary | ICD-10-CM | POA: Diagnosis not present

## 2011-10-05 DIAGNOSIS — Z79899 Other long term (current) drug therapy: Secondary | ICD-10-CM | POA: Diagnosis not present

## 2011-10-05 DIAGNOSIS — R112 Nausea with vomiting, unspecified: Secondary | ICD-10-CM | POA: Diagnosis not present

## 2011-10-05 DIAGNOSIS — Z9181 History of falling: Secondary | ICD-10-CM | POA: Diagnosis not present

## 2011-10-05 DIAGNOSIS — K5732 Diverticulitis of large intestine without perforation or abscess without bleeding: Secondary | ICD-10-CM | POA: Diagnosis not present

## 2011-10-05 DIAGNOSIS — Z885 Allergy status to narcotic agent status: Secondary | ICD-10-CM | POA: Diagnosis not present

## 2011-10-05 DIAGNOSIS — E860 Dehydration: Secondary | ICD-10-CM | POA: Diagnosis not present

## 2011-10-05 DIAGNOSIS — R109 Unspecified abdominal pain: Secondary | ICD-10-CM | POA: Diagnosis not present

## 2011-10-05 LAB — CBC WITH DIFFERENTIAL/PLATELET
Basophil #: 0 10*3/uL (ref 0.0–0.1)
Basophil %: 0.2 %
Eosinophil #: 0 10*3/uL (ref 0.0–0.7)
Eosinophil %: 0 %
HCT: 39.3 % (ref 35.0–47.0)
HGB: 13.7 g/dL (ref 12.0–16.0)
Lymphocyte #: 0.4 10*3/uL — ABNORMAL LOW (ref 1.0–3.6)
Lymphocyte %: 3 %
MCH: 34 pg (ref 26.0–34.0)
MCHC: 34.7 g/dL (ref 32.0–36.0)
MCV: 98 fL (ref 80–100)
Monocyte #: 1 x10 3/mm — ABNORMAL HIGH (ref 0.2–0.9)
Monocyte %: 8.3 %
Neutrophil #: 11.2 10*3/uL — ABNORMAL HIGH (ref 1.4–6.5)
Neutrophil %: 88.5 %
Platelet: 202 10*3/uL (ref 150–440)
RBC: 4.02 10*6/uL (ref 3.80–5.20)
RDW: 12.5 % (ref 11.5–14.5)
WBC: 12.6 10*3/uL — ABNORMAL HIGH (ref 3.6–11.0)

## 2011-10-05 LAB — BASIC METABOLIC PANEL
Anion Gap: 5 — ABNORMAL LOW (ref 7–16)
BUN: 18 mg/dL (ref 7–18)
Calcium, Total: 9.3 mg/dL (ref 8.5–10.1)
Chloride: 106 mmol/L (ref 98–107)
Co2: 25 mmol/L (ref 21–32)
Creatinine: 0.58 mg/dL — ABNORMAL LOW (ref 0.60–1.30)
EGFR (African American): >60
EGFR (Non-African Amer.): >60
Glucose: 126 mg/dL — ABNORMAL HIGH (ref 65–99)
Osmolality: 275 (ref 275–301)
Potassium: 4.3 mmol/L (ref 3.5–5.1)
Sodium: 136 mmol/L (ref 136–145)

## 2011-10-05 LAB — URINALYSIS, COMPLETE
Bilirubin,UR: NEGATIVE
Blood: NEGATIVE
Glucose,UR: NEGATIVE mg/dL (ref 0–75)
Nitrite: POSITIVE
Ph: 5 (ref 4.5–8.0)
Protein: NEGATIVE
RBC,UR: 1 /HPF (ref 0–5)
Specific Gravity: 1.027 (ref 1.003–1.030)
Squamous Epithelial: 1
WBC UR: 2 /HPF (ref 0–5)

## 2011-10-06 LAB — CBC WITH DIFFERENTIAL/PLATELET
Basophil #: 0 10*3/uL (ref 0.0–0.1)
Basophil %: 0.2 %
Eosinophil #: 0 10*3/uL (ref 0.0–0.7)
Eosinophil %: 0.1 %
HCT: 33.3 % — ABNORMAL LOW (ref 35.0–47.0)
HGB: 11.8 g/dL — ABNORMAL LOW (ref 12.0–16.0)
Lymphocyte #: 0.7 10*3/uL — ABNORMAL LOW (ref 1.0–3.6)
Lymphocyte %: 6.9 %
MCH: 35 pg — ABNORMAL HIGH (ref 26.0–34.0)
MCHC: 35.4 g/dL (ref 32.0–36.0)
MCV: 99 fL (ref 80–100)
Monocyte #: 0.8 x10 3/mm (ref 0.2–0.9)
Monocyte %: 8.6 %
Neutrophil #: 8.2 10*3/uL — ABNORMAL HIGH (ref 1.4–6.5)
Neutrophil %: 84.2 %
Platelet: 146 10*3/uL — ABNORMAL LOW (ref 150–440)
RBC: 3.37 10*6/uL — ABNORMAL LOW (ref 3.80–5.20)
RDW: 12.5 % (ref 11.5–14.5)
WBC: 9.7 10*3/uL (ref 3.6–11.0)

## 2011-10-11 ENCOUNTER — Inpatient Hospital Stay: Payer: Self-pay | Admitting: Surgery

## 2011-10-11 ENCOUNTER — Ambulatory Visit: Payer: Self-pay | Admitting: Family Medicine

## 2011-10-11 DIAGNOSIS — J9819 Other pulmonary collapse: Secondary | ICD-10-CM | POA: Diagnosis not present

## 2011-10-11 DIAGNOSIS — K612 Anorectal abscess: Secondary | ICD-10-CM | POA: Diagnosis not present

## 2011-10-11 DIAGNOSIS — Z79899 Other long term (current) drug therapy: Secondary | ICD-10-CM | POA: Diagnosis not present

## 2011-10-11 DIAGNOSIS — R52 Pain, unspecified: Secondary | ICD-10-CM | POA: Diagnosis not present

## 2011-10-11 DIAGNOSIS — E876 Hypokalemia: Secondary | ICD-10-CM | POA: Diagnosis not present

## 2011-10-11 DIAGNOSIS — Z8781 Personal history of (healed) traumatic fracture: Secondary | ICD-10-CM | POA: Diagnosis not present

## 2011-10-11 DIAGNOSIS — R109 Unspecified abdominal pain: Secondary | ICD-10-CM | POA: Diagnosis not present

## 2011-10-11 DIAGNOSIS — I10 Essential (primary) hypertension: Secondary | ICD-10-CM | POA: Diagnosis not present

## 2011-10-11 DIAGNOSIS — R945 Abnormal results of liver function studies: Secondary | ICD-10-CM | POA: Diagnosis present

## 2011-10-11 DIAGNOSIS — K5732 Diverticulitis of large intestine without perforation or abscess without bleeding: Secondary | ICD-10-CM | POA: Diagnosis not present

## 2011-10-11 DIAGNOSIS — Z885 Allergy status to narcotic agent status: Secondary | ICD-10-CM | POA: Diagnosis not present

## 2011-10-11 DIAGNOSIS — R42 Dizziness and giddiness: Secondary | ICD-10-CM | POA: Diagnosis not present

## 2011-10-11 DIAGNOSIS — Z809 Family history of malignant neoplasm, unspecified: Secondary | ICD-10-CM | POA: Diagnosis not present

## 2011-10-11 DIAGNOSIS — R7989 Other specified abnormal findings of blood chemistry: Secondary | ICD-10-CM | POA: Diagnosis not present

## 2011-10-11 DIAGNOSIS — R748 Abnormal levels of other serum enzymes: Secondary | ICD-10-CM | POA: Diagnosis not present

## 2011-10-11 LAB — CBC WITH DIFFERENTIAL/PLATELET
Basophil #: 0.1 10*3/uL (ref 0.0–0.1)
Basophil %: 1.1 %
Eosinophil #: 0.1 10*3/uL (ref 0.0–0.7)
Eosinophil %: 1.5 %
HCT: 39.6 % (ref 35.0–47.0)
HGB: 13.7 g/dL (ref 12.0–16.0)
Lymphocyte #: 0.8 10*3/uL — ABNORMAL LOW (ref 1.0–3.6)
Lymphocyte %: 9.3 %
MCH: 34 pg (ref 26.0–34.0)
MCHC: 34.6 g/dL (ref 32.0–36.0)
MCV: 98 fL (ref 80–100)
Monocyte #: 1.1 x10 3/mm — ABNORMAL HIGH (ref 0.2–0.9)
Monocyte %: 13.5 %
Neutrophil #: 6.4 10*3/uL (ref 1.4–6.5)
Neutrophil %: 74.6 %
Platelet: 314 10*3/uL (ref 150–440)
RBC: 4.03 10*6/uL (ref 3.80–5.20)
RDW: 12.5 % (ref 11.5–14.5)
WBC: 8.5 10*3/uL (ref 3.6–11.0)

## 2011-10-11 LAB — COMPREHENSIVE METABOLIC PANEL
Albumin: 3.1 g/dL — ABNORMAL LOW (ref 3.4–5.0)
Alkaline Phosphatase: 394 U/L — ABNORMAL HIGH (ref 50–136)
Anion Gap: 12 (ref 7–16)
BUN: 6 mg/dL — ABNORMAL LOW (ref 7–18)
Bilirubin,Total: 0.6 mg/dL (ref 0.2–1.0)
Calcium, Total: 9.1 mg/dL (ref 8.5–10.1)
Chloride: 98 mmol/L (ref 98–107)
Co2: 27 mmol/L (ref 21–32)
Creatinine: 0.55 mg/dL — ABNORMAL LOW (ref 0.60–1.30)
EGFR (African American): >60
EGFR (Non-African Amer.): >60
Glucose: 87 mg/dL (ref 65–99)
Osmolality: 271 (ref 275–301)
Potassium: 3.1 mmol/L — ABNORMAL LOW (ref 3.5–5.1)
SGOT(AST): 137 U/L — ABNORMAL HIGH (ref 15–37)
SGPT (ALT): 115 U/L — ABNORMAL HIGH (ref 12–78)
Sodium: 137 mmol/L (ref 136–145)
Total Protein: 7.8 g/dL (ref 6.4–8.2)

## 2011-10-11 LAB — URINALYSIS, COMPLETE
Bacteria: NONE SEEN
Bilirubin,UR: NEGATIVE
Glucose,UR: NEGATIVE mg/dL (ref 0–75)
Leukocyte Esterase: NEGATIVE
Nitrite: NEGATIVE
Ph: 7 (ref 4.5–8.0)
Protein: NEGATIVE
RBC,UR: <1 /HPF (ref 0–5)
Specific Gravity: 1.019 (ref 1.003–1.030)
Squamous Epithelial: <1
WBC UR: 1 /HPF (ref 0–5)

## 2011-10-11 LAB — APTT: Activated PTT: 31.8 secs (ref 23.6–35.9)

## 2011-10-11 LAB — PROTIME-INR
INR: 1
Prothrombin Time: 13.2 secs (ref 11.5–14.7)

## 2011-10-12 LAB — CBC WITH DIFFERENTIAL/PLATELET
Basophil #: 0 10*3/uL (ref 0.0–0.1)
Basophil %: 0.2 %
Eosinophil #: 0.2 10*3/uL (ref 0.0–0.7)
Eosinophil %: 2.9 %
HCT: 33.7 % — ABNORMAL LOW (ref 35.0–47.0)
HGB: 11.9 g/dL — ABNORMAL LOW (ref 12.0–16.0)
Lymphocyte #: 0.9 10*3/uL — ABNORMAL LOW (ref 1.0–3.6)
Lymphocyte %: 11.3 %
MCH: 34.2 pg — ABNORMAL HIGH (ref 26.0–34.0)
MCHC: 35.2 g/dL (ref 32.0–36.0)
MCV: 97 fL (ref 80–100)
Monocyte #: 1.3 x10 3/mm — ABNORMAL HIGH (ref 0.2–0.9)
Monocyte %: 16.9 %
Neutrophil #: 5.2 10*3/uL (ref 1.4–6.5)
Neutrophil %: 68.7 %
Platelet: 303 10*3/uL (ref 150–440)
RBC: 3.47 10*6/uL — ABNORMAL LOW (ref 3.80–5.20)
RDW: 12.6 % (ref 11.5–14.5)
WBC: 7.6 10*3/uL (ref 3.6–11.0)

## 2011-10-12 LAB — COMPREHENSIVE METABOLIC PANEL
Albumin: 2.5 g/dL — ABNORMAL LOW (ref 3.4–5.0)
Alkaline Phosphatase: 348 U/L — ABNORMAL HIGH (ref 50–136)
Anion Gap: 14 (ref 7–16)
BUN: 6 mg/dL — ABNORMAL LOW (ref 7–18)
Bilirubin,Total: 0.5 mg/dL (ref 0.2–1.0)
Calcium, Total: 8.4 mg/dL — ABNORMAL LOW (ref 8.5–10.1)
Chloride: 101 mmol/L (ref 98–107)
Co2: 25 mmol/L (ref 21–32)
Creatinine: 0.66 mg/dL (ref 0.60–1.30)
EGFR (African American): >60
EGFR (Non-African Amer.): >60
Glucose: 73 mg/dL (ref 65–99)
Osmolality: 276 (ref 275–301)
Potassium: 2.9 mmol/L — ABNORMAL LOW (ref 3.5–5.1)
SGOT(AST): 102 U/L — ABNORMAL HIGH (ref 15–37)
SGPT (ALT): 90 U/L — ABNORMAL HIGH (ref 12–78)
Sodium: 140 mmol/L (ref 136–145)
Total Protein: 6.1 g/dL — ABNORMAL LOW (ref 6.4–8.2)

## 2011-10-12 LAB — CLOSTRIDIUM DIFFICILE BY PCR

## 2011-10-12 LAB — MAGNESIUM: Magnesium: 1.8 mg/dL

## 2011-10-13 LAB — COMPREHENSIVE METABOLIC PANEL
Albumin: 2.6 g/dL — ABNORMAL LOW (ref 3.4–5.0)
Anion Gap: 12 (ref 7–16)
Bilirubin,Total: 0.5 mg/dL (ref 0.2–1.0)
Chloride: 105 mmol/L (ref 98–107)
EGFR (African American): >60
Glucose: 72 mg/dL (ref 65–99)
Potassium: 3.8 mmol/L (ref 3.5–5.1)
Sodium: 140 mmol/L (ref 136–145)
Total Protein: 6.6 g/dL (ref 6.4–8.2)

## 2011-10-13 LAB — CBC WITH DIFFERENTIAL/PLATELET
Basophil #: 0 10*3/uL (ref 0.0–0.1)
Lymphocyte #: 1 10*3/uL (ref 1.0–3.6)
Lymphocyte %: 10.3 %
MCH: 33.5 pg (ref 26.0–34.0)
MCHC: 34.2 g/dL (ref 32.0–36.0)
Monocyte #: 1.4 x10 3/mm — ABNORMAL HIGH (ref 0.2–0.9)
Neutrophil #: 7.3 10*3/uL — ABNORMAL HIGH (ref 1.4–6.5)
Neutrophil %: 73.1 %
Platelet: 379 10*3/uL (ref 150–440)
RBC: 3.63 10*6/uL — ABNORMAL LOW (ref 3.80–5.20)

## 2011-10-13 LAB — MAGNESIUM: Magnesium: 1.9 mg/dL

## 2011-10-13 LAB — URINE CULTURE

## 2011-10-14 LAB — CBC WITH DIFFERENTIAL/PLATELET
Basophil #: 0.1 10*3/uL (ref 0.0–0.1)
Eosinophil #: 0.2 10*3/uL (ref 0.0–0.7)
HCT: 38.3 % (ref 35.0–47.0)
Lymphocyte #: 1.3 10*3/uL (ref 1.0–3.6)
Lymphocyte %: 12.7 %
MCHC: 34.7 g/dL (ref 32.0–36.0)
Neutrophil #: 7.8 10*3/uL — ABNORMAL HIGH (ref 1.4–6.5)
Neutrophil %: 75.2 %
RDW: 13.1 % (ref 11.5–14.5)
WBC: 10.4 10*3/uL (ref 3.6–11.0)

## 2011-10-14 LAB — COMPREHENSIVE METABOLIC PANEL
Albumin: 2.8 g/dL — ABNORMAL LOW (ref 3.4–5.0)
Anion Gap: 12 (ref 7–16)
BUN: 3 mg/dL — ABNORMAL LOW (ref 7–18)
Chloride: 103 mmol/L (ref 98–107)
EGFR (African American): >60
Osmolality: 271 (ref 275–301)
Potassium: 4.1 mmol/L (ref 3.5–5.1)
SGOT(AST): 32 U/L (ref 15–37)
Sodium: 138 mmol/L (ref 136–145)
Total Protein: 7.3 g/dL (ref 6.4–8.2)

## 2011-10-15 LAB — COMPREHENSIVE METABOLIC PANEL
Albumin: 2.8 g/dL — ABNORMAL LOW (ref 3.4–5.0)
BUN: 4 mg/dL — ABNORMAL LOW (ref 7–18)
EGFR (Non-African Amer.): >60
Potassium: 4 mmol/L (ref 3.5–5.1)
SGOT(AST): 23 U/L (ref 15–37)
SGPT (ALT): 50 U/L (ref 12–78)
Total Protein: 7.2 g/dL (ref 6.4–8.2)

## 2011-10-17 LAB — CULTURE, BLOOD (SINGLE)

## 2011-10-25 ENCOUNTER — Inpatient Hospital Stay: Payer: Self-pay | Admitting: Surgery

## 2011-10-25 DIAGNOSIS — K5732 Diverticulitis of large intestine without perforation or abscess without bleeding: Secondary | ICD-10-CM | POA: Diagnosis not present

## 2011-10-25 DIAGNOSIS — Z79899 Other long term (current) drug therapy: Secondary | ICD-10-CM | POA: Diagnosis not present

## 2011-10-25 DIAGNOSIS — K5730 Diverticulosis of large intestine without perforation or abscess without bleeding: Secondary | ICD-10-CM | POA: Diagnosis not present

## 2011-10-25 DIAGNOSIS — Z885 Allergy status to narcotic agent status: Secondary | ICD-10-CM | POA: Diagnosis not present

## 2011-10-25 DIAGNOSIS — I10 Essential (primary) hypertension: Secondary | ICD-10-CM | POA: Diagnosis not present

## 2011-10-25 DIAGNOSIS — R197 Diarrhea, unspecified: Secondary | ICD-10-CM | POA: Diagnosis not present

## 2011-10-25 DIAGNOSIS — K560 Paralytic ileus: Secondary | ICD-10-CM | POA: Diagnosis not present

## 2011-10-25 DIAGNOSIS — K929 Disease of digestive system, unspecified: Secondary | ICD-10-CM | POA: Diagnosis not present

## 2011-10-25 DIAGNOSIS — R5381 Other malaise: Secondary | ICD-10-CM | POA: Diagnosis not present

## 2011-10-25 LAB — COMPREHENSIVE METABOLIC PANEL
Albumin: 3.3 g/dL — ABNORMAL LOW (ref 3.4–5.0)
Alkaline Phosphatase: 346 U/L — ABNORMAL HIGH (ref 50–136)
BUN: 10 mg/dL (ref 7–18)
Bilirubin,Total: 0.6 mg/dL (ref 0.2–1.0)
Chloride: 99 mmol/L (ref 98–107)
Co2: 26 mmol/L (ref 21–32)
EGFR (African American): >60
Osmolality: 269 (ref 275–301)
Potassium: 3.9 mmol/L (ref 3.5–5.1)

## 2011-10-25 LAB — CBC
MCH: 33.2 pg (ref 26.0–34.0)
MCHC: 34.3 g/dL (ref 32.0–36.0)
MCV: 97 fL (ref 80–100)
Platelet: 506 10*3/uL — ABNORMAL HIGH (ref 150–440)
RDW: 12.2 % (ref 11.5–14.5)

## 2011-10-25 LAB — URINALYSIS, COMPLETE
Glucose,UR: NEGATIVE mg/dL (ref 0–75)
Ketone: NEGATIVE
Nitrite: NEGATIVE
Protein: NEGATIVE
RBC,UR: 1 /HPF (ref 0–5)
Squamous Epithelial: NONE SEEN
WBC UR: 1 /HPF (ref 0–5)

## 2011-10-25 LAB — MAGNESIUM: Magnesium: 1.8 mg/dL

## 2011-10-25 LAB — PROTIME-INR: Prothrombin Time: 14.3 secs (ref 11.5–14.7)

## 2011-10-25 LAB — LIPASE, BLOOD: Lipase: 263 U/L (ref 73–393)

## 2011-10-26 LAB — CLOSTRIDIUM DIFFICILE BY PCR

## 2011-10-26 LAB — BASIC METABOLIC PANEL
Anion Gap: 9 (ref 7–16)
BUN: 5 mg/dL — ABNORMAL LOW (ref 7–18)
Co2: 27 mmol/L (ref 21–32)
Creatinine: 0.62 mg/dL (ref 0.60–1.30)
EGFR (African American): >60
Osmolality: 278 (ref 275–301)

## 2011-10-26 LAB — CBC WITH DIFFERENTIAL/PLATELET
Basophil #: 0.1 10*3/uL (ref 0.0–0.1)
Eosinophil %: 1.1 %
HCT: 32.2 % — ABNORMAL LOW (ref 35.0–47.0)
HGB: 11.2 g/dL — ABNORMAL LOW (ref 12.0–16.0)
Lymphocyte #: 1 10*3/uL (ref 1.0–3.6)
Lymphocyte %: 15 %
MCH: 33.7 pg (ref 26.0–34.0)
Monocyte %: 13 %
Neutrophil #: 4.5 10*3/uL (ref 1.4–6.5)
RBC: 3.33 10*6/uL — ABNORMAL LOW (ref 3.80–5.20)
RDW: 11.9 % (ref 11.5–14.5)
WBC: 6.4 10*3/uL (ref 3.6–11.0)

## 2011-10-27 LAB — CBC WITH DIFFERENTIAL/PLATELET
Eosinophil %: 3.2 %
HCT: 32.8 % — ABNORMAL LOW (ref 35.0–47.0)
HGB: 11.8 g/dL — ABNORMAL LOW (ref 12.0–16.0)
Lymphocyte #: 1.1 10*3/uL (ref 1.0–3.6)
Lymphocyte %: 21.1 %
MCHC: 36.1 g/dL — ABNORMAL HIGH (ref 32.0–36.0)
Monocyte %: 14.1 %
Neutrophil #: 3.2 10*3/uL (ref 1.4–6.5)
Neutrophil %: 60.4 %
RBC: 3.38 10*6/uL — ABNORMAL LOW (ref 3.80–5.20)
WBC: 5.4 10*3/uL (ref 3.6–11.0)

## 2011-10-28 LAB — CBC WITH DIFFERENTIAL/PLATELET
Basophil %: 1.4 %
Eosinophil #: 0.2 10*3/uL (ref 0.0–0.7)
Eosinophil %: 3.2 %
HCT: 33 % — ABNORMAL LOW (ref 35.0–47.0)
HGB: 11.5 g/dL — ABNORMAL LOW (ref 12.0–16.0)
Lymphocyte %: 27.4 %
MCV: 97 fL (ref 80–100)
Monocyte #: 0.7 x10 3/mm (ref 0.2–0.9)
Monocyte %: 13.9 %
Neutrophil #: 2.6 10*3/uL (ref 1.4–6.5)
RDW: 12.2 % (ref 11.5–14.5)
WBC: 4.9 10*3/uL (ref 3.6–11.0)

## 2011-10-28 LAB — BASIC METABOLIC PANEL
Anion Gap: 9 (ref 7–16)
BUN: 6 mg/dL — ABNORMAL LOW (ref 7–18)
Co2: 27 mmol/L (ref 21–32)
Creatinine: 0.67 mg/dL (ref 0.60–1.30)
EGFR (African American): >60
EGFR (Non-African Amer.): >60
Glucose: 92 mg/dL (ref 65–99)
Osmolality: 282 (ref 275–301)
Sodium: 143 mmol/L (ref 136–145)

## 2011-10-29 LAB — BASIC METABOLIC PANEL
Anion Gap: 12 (ref 7–16)
BUN: 5 mg/dL — ABNORMAL LOW (ref 7–18)
Calcium, Total: 9.1 mg/dL (ref 8.5–10.1)
Co2: 23 mmol/L (ref 21–32)
Creatinine: 0.62 mg/dL (ref 0.60–1.30)
EGFR (African American): >60
Osmolality: 282 (ref 275–301)
Potassium: 3.9 mmol/L (ref 3.5–5.1)

## 2011-10-29 LAB — CBC WITH DIFFERENTIAL/PLATELET
Basophil %: 0.8 %
Eosinophil %: 2.3 %
HCT: 35.6 % (ref 35.0–47.0)
Lymphocyte #: 1.3 10*3/uL (ref 1.0–3.6)
MCH: 34.1 pg — ABNORMAL HIGH (ref 26.0–34.0)
MCHC: 35 g/dL (ref 32.0–36.0)
MCV: 97 fL (ref 80–100)
Monocyte #: 0.6 x10 3/mm (ref 0.2–0.9)
Monocyte %: 10 %
Neutrophil #: 3.6 10*3/uL (ref 1.4–6.5)
Neutrophil %: 64.4 %
Platelet: 420 10*3/uL (ref 150–440)
WBC: 5.6 10*3/uL (ref 3.6–11.0)

## 2011-10-30 LAB — BASIC METABOLIC PANEL
Chloride: 103 mmol/L (ref 98–107)
Creatinine: 0.66 mg/dL (ref 0.60–1.30)
EGFR (African American): >60
EGFR (Non-African Amer.): >60
Glucose: 143 mg/dL — ABNORMAL HIGH (ref 65–99)
Osmolality: 276 (ref 275–301)
Potassium: 4 mmol/L (ref 3.5–5.1)
Sodium: 138 mmol/L (ref 136–145)

## 2011-10-30 LAB — CBC WITH DIFFERENTIAL/PLATELET
Basophil %: 0.2 %
Eosinophil #: 0 10*3/uL (ref 0.0–0.7)
Eosinophil %: 0.4 %
HCT: 31.9 % — ABNORMAL LOW (ref 35.0–47.0)
HGB: 10.9 g/dL — ABNORMAL LOW (ref 12.0–16.0)
Lymphocyte %: 10.7 %
MCHC: 34.2 g/dL (ref 32.0–36.0)
Monocyte #: 1.3 x10 3/mm — ABNORMAL HIGH (ref 0.2–0.9)
Monocyte %: 14.3 %
Neutrophil %: 74.4 %
RBC: 3.3 10*6/uL — ABNORMAL LOW (ref 3.80–5.20)
WBC: 9.3 10*3/uL (ref 3.6–11.0)

## 2011-10-31 LAB — CBC WITH DIFFERENTIAL/PLATELET
Basophil #: 0.1 10*3/uL (ref 0.0–0.1)
Basophil %: 0.6 %
Eosinophil #: 0.3 10*3/uL (ref 0.0–0.7)
Eosinophil %: 2.9 %
HCT: 29.7 % — ABNORMAL LOW (ref 35.0–47.0)
HGB: 10 g/dL — ABNORMAL LOW (ref 12.0–16.0)
Lymphocyte %: 11.1 %
Monocyte #: 1.4 x10 3/mm — ABNORMAL HIGH (ref 0.2–0.9)
Monocyte %: 13.9 %
RBC: 3.05 10*6/uL — ABNORMAL LOW (ref 3.80–5.20)
RDW: 12 % (ref 11.5–14.5)
WBC: 9.8 10*3/uL (ref 3.6–11.0)

## 2011-10-31 LAB — BASIC METABOLIC PANEL
Anion Gap: 5 — ABNORMAL LOW (ref 7–16)
BUN: 2 mg/dL — ABNORMAL LOW (ref 7–18)
Calcium, Total: 8.3 mg/dL — ABNORMAL LOW (ref 8.5–10.1)
Chloride: 101 mmol/L (ref 98–107)
EGFR (African American): >60
EGFR (Non-African Amer.): >60
Glucose: 111 mg/dL — ABNORMAL HIGH (ref 65–99)
Osmolality: 269 (ref 275–301)

## 2011-10-31 LAB — CULTURE, BLOOD (SINGLE)

## 2011-11-03 LAB — PATHOLOGY REPORT

## 2011-11-05 LAB — CREATININE, SERUM
Creatinine: 0.65 mg/dL (ref 0.60–1.30)
EGFR (African American): >60
EGFR (Non-African Amer.): >60

## 2011-11-05 LAB — BASIC METABOLIC PANEL
Anion Gap: 8 (ref 7–16)
BUN: 4 mg/dL — ABNORMAL LOW (ref 7–18)
Co2: 27 mmol/L (ref 21–32)
Glucose: 98 mg/dL (ref 65–99)
Osmolality: 278 (ref 275–301)
Sodium: 141 mmol/L (ref 136–145)

## 2011-11-08 DIAGNOSIS — I10 Essential (primary) hypertension: Secondary | ICD-10-CM | POA: Diagnosis not present

## 2011-11-08 DIAGNOSIS — Z433 Encounter for attention to colostomy: Secondary | ICD-10-CM | POA: Diagnosis not present

## 2011-11-08 DIAGNOSIS — K5733 Diverticulitis of large intestine without perforation or abscess with bleeding: Secondary | ICD-10-CM | POA: Diagnosis not present

## 2011-11-10 DIAGNOSIS — K5733 Diverticulitis of large intestine without perforation or abscess with bleeding: Secondary | ICD-10-CM | POA: Diagnosis not present

## 2011-11-10 DIAGNOSIS — Z433 Encounter for attention to colostomy: Secondary | ICD-10-CM | POA: Diagnosis not present

## 2011-11-10 DIAGNOSIS — I10 Essential (primary) hypertension: Secondary | ICD-10-CM | POA: Diagnosis not present

## 2011-11-12 DIAGNOSIS — K5733 Diverticulitis of large intestine without perforation or abscess with bleeding: Secondary | ICD-10-CM | POA: Diagnosis not present

## 2011-11-12 DIAGNOSIS — Z433 Encounter for attention to colostomy: Secondary | ICD-10-CM | POA: Diagnosis not present

## 2011-11-12 DIAGNOSIS — I10 Essential (primary) hypertension: Secondary | ICD-10-CM | POA: Diagnosis not present

## 2011-11-16 DIAGNOSIS — I10 Essential (primary) hypertension: Secondary | ICD-10-CM | POA: Diagnosis not present

## 2011-11-16 DIAGNOSIS — K5733 Diverticulitis of large intestine without perforation or abscess with bleeding: Secondary | ICD-10-CM | POA: Diagnosis not present

## 2011-11-16 DIAGNOSIS — Z433 Encounter for attention to colostomy: Secondary | ICD-10-CM | POA: Diagnosis not present

## 2011-11-23 DIAGNOSIS — Z433 Encounter for attention to colostomy: Secondary | ICD-10-CM | POA: Diagnosis not present

## 2011-11-23 DIAGNOSIS — K5733 Diverticulitis of large intestine without perforation or abscess with bleeding: Secondary | ICD-10-CM | POA: Diagnosis not present

## 2011-11-23 DIAGNOSIS — I10 Essential (primary) hypertension: Secondary | ICD-10-CM | POA: Diagnosis not present

## 2011-12-13 DIAGNOSIS — K630 Abscess of intestine: Secondary | ICD-10-CM | POA: Diagnosis not present

## 2011-12-13 DIAGNOSIS — K5732 Diverticulitis of large intestine without perforation or abscess without bleeding: Secondary | ICD-10-CM | POA: Diagnosis not present

## 2012-01-05 DIAGNOSIS — Z433 Encounter for attention to colostomy: Secondary | ICD-10-CM | POA: Diagnosis not present

## 2012-01-05 DIAGNOSIS — K5733 Diverticulitis of large intestine without perforation or abscess with bleeding: Secondary | ICD-10-CM | POA: Diagnosis not present

## 2012-01-05 DIAGNOSIS — I10 Essential (primary) hypertension: Secondary | ICD-10-CM | POA: Diagnosis not present

## 2012-02-28 DIAGNOSIS — K5732 Diverticulitis of large intestine without perforation or abscess without bleeding: Secondary | ICD-10-CM | POA: Diagnosis not present

## 2012-02-28 DIAGNOSIS — I10 Essential (primary) hypertension: Secondary | ICD-10-CM | POA: Diagnosis not present

## 2012-03-17 ENCOUNTER — Other Ambulatory Visit: Payer: Self-pay | Admitting: Family Medicine

## 2012-03-17 DIAGNOSIS — I10 Essential (primary) hypertension: Secondary | ICD-10-CM | POA: Diagnosis not present

## 2012-03-17 DIAGNOSIS — G8921 Chronic pain due to trauma: Secondary | ICD-10-CM | POA: Diagnosis not present

## 2012-03-17 DIAGNOSIS — E7800 Pure hypercholesterolemia, unspecified: Secondary | ICD-10-CM | POA: Diagnosis not present

## 2012-03-17 DIAGNOSIS — IMO0002 Reserved for concepts with insufficient information to code with codable children: Secondary | ICD-10-CM | POA: Diagnosis not present

## 2012-03-17 DIAGNOSIS — Z1231 Encounter for screening mammogram for malignant neoplasm of breast: Secondary | ICD-10-CM

## 2012-03-17 DIAGNOSIS — E2839 Other primary ovarian failure: Secondary | ICD-10-CM

## 2012-04-11 ENCOUNTER — Ambulatory Visit: Payer: Self-pay | Admitting: Surgery

## 2012-04-11 DIAGNOSIS — Z01812 Encounter for preprocedural laboratory examination: Secondary | ICD-10-CM | POA: Diagnosis not present

## 2012-04-11 DIAGNOSIS — I119 Hypertensive heart disease without heart failure: Secondary | ICD-10-CM | POA: Diagnosis not present

## 2012-04-11 DIAGNOSIS — I10 Essential (primary) hypertension: Secondary | ICD-10-CM | POA: Diagnosis not present

## 2012-04-11 DIAGNOSIS — K5732 Diverticulitis of large intestine without perforation or abscess without bleeding: Secondary | ICD-10-CM | POA: Diagnosis not present

## 2012-04-11 DIAGNOSIS — Z01810 Encounter for preprocedural cardiovascular examination: Secondary | ICD-10-CM | POA: Diagnosis not present

## 2012-04-11 LAB — CBC WITH DIFFERENTIAL/PLATELET
Basophil %: 0.8 %
Eosinophil #: 0.1 10*3/uL (ref 0.0–0.7)
Eosinophil %: 1.5 %
Lymphocyte #: 1.1 10*3/uL (ref 1.0–3.6)
MCH: 33.7 pg (ref 26.0–34.0)
MCHC: 33.9 g/dL (ref 32.0–36.0)
MCV: 99 fL (ref 80–100)
Monocyte #: 0.5 x10 3/mm (ref 0.2–0.9)
Monocyte %: 13.3 %
Platelet: 237 10*3/uL (ref 150–440)
RBC: 3.91 10*6/uL (ref 3.80–5.20)
RDW: 12.7 % (ref 11.5–14.5)

## 2012-04-11 LAB — BASIC METABOLIC PANEL
Anion Gap: 4 — ABNORMAL LOW (ref 7–16)
BUN: 15 mg/dL (ref 7–18)
Calcium, Total: 8.6 mg/dL (ref 8.5–10.1)
Creatinine: 0.81 mg/dL (ref 0.60–1.30)
EGFR (African American): >60
EGFR (Non-African Amer.): >60
Glucose: 78 mg/dL (ref 65–99)
Osmolality: 277 (ref 275–301)
Sodium: 139 mmol/L (ref 136–145)

## 2012-04-13 ENCOUNTER — Ambulatory Visit
Admission: RE | Admit: 2012-04-13 | Discharge: 2012-04-13 | Disposition: A | Discharge status: Home / Self Care | Payer: Medicare Other | Source: Ambulatory Visit | Attending: Family Medicine | Admitting: Family Medicine

## 2012-04-13 DIAGNOSIS — M899 Disorder of bone, unspecified: Secondary | ICD-10-CM | POA: Diagnosis not present

## 2012-04-13 DIAGNOSIS — M949 Disorder of cartilage, unspecified: Secondary | ICD-10-CM | POA: Diagnosis not present

## 2012-04-13 DIAGNOSIS — Z1231 Encounter for screening mammogram for malignant neoplasm of breast: Secondary | ICD-10-CM | POA: Diagnosis not present

## 2012-04-13 DIAGNOSIS — E2839 Other primary ovarian failure: Secondary | ICD-10-CM

## 2012-04-18 ENCOUNTER — Inpatient Hospital Stay: Payer: Self-pay | Admitting: Surgery

## 2012-04-18 DIAGNOSIS — Z885 Allergy status to narcotic agent status: Secondary | ICD-10-CM | POA: Diagnosis not present

## 2012-04-18 DIAGNOSIS — N736 Female pelvic peritoneal adhesions (postinfective): Secondary | ICD-10-CM | POA: Diagnosis present

## 2012-04-18 DIAGNOSIS — Z79899 Other long term (current) drug therapy: Secondary | ICD-10-CM | POA: Diagnosis not present

## 2012-04-18 DIAGNOSIS — Z433 Encounter for attention to colostomy: Secondary | ICD-10-CM | POA: Diagnosis not present

## 2012-04-18 DIAGNOSIS — K560 Paralytic ileus: Secondary | ICD-10-CM | POA: Diagnosis not present

## 2012-04-18 DIAGNOSIS — K929 Disease of digestive system, unspecified: Secondary | ICD-10-CM | POA: Diagnosis not present

## 2012-04-18 DIAGNOSIS — K5732 Diverticulitis of large intestine without perforation or abscess without bleeding: Secondary | ICD-10-CM | POA: Diagnosis not present

## 2012-04-18 DIAGNOSIS — Z8719 Personal history of other diseases of the digestive system: Secondary | ICD-10-CM | POA: Diagnosis not present

## 2012-04-18 DIAGNOSIS — I10 Essential (primary) hypertension: Secondary | ICD-10-CM | POA: Diagnosis present

## 2012-04-19 LAB — CBC WITH DIFFERENTIAL/PLATELET
Basophil #: 0 10*3/uL (ref 0.0–0.1)
Basophil %: 0 %
Eosinophil %: 0 %
HCT: 38.4 % (ref 35.0–47.0)
Lymphocyte #: 0.7 10*3/uL — ABNORMAL LOW (ref 1.0–3.6)
Lymphocyte %: 7.1 %
MCH: 33.4 pg (ref 26.0–34.0)
MCHC: 33.8 g/dL (ref 32.0–36.0)
MCV: 99 fL (ref 80–100)
Monocyte %: 9.6 %
Neutrophil #: 8.5 10*3/uL — ABNORMAL HIGH (ref 1.4–6.5)
Neutrophil %: 83.3 %
RDW: 12.7 % (ref 11.5–14.5)
WBC: 10.3 10*3/uL (ref 3.6–11.0)

## 2012-04-19 LAB — BASIC METABOLIC PANEL
BUN: 9 mg/dL (ref 7–18)
Chloride: 104 mmol/L (ref 98–107)
Co2: 30 mmol/L (ref 21–32)
Creatinine: 0.64 mg/dL (ref 0.60–1.30)
Osmolality: 276 (ref 275–301)
Sodium: 138 mmol/L (ref 136–145)

## 2012-04-19 LAB — PATHOLOGY REPORT

## 2012-04-20 LAB — CBC WITH DIFFERENTIAL/PLATELET
Basophil %: 0.2 %
Eosinophil %: 0 %
HCT: 39.5 % (ref 35.0–47.0)
HGB: 13.6 g/dL (ref 12.0–16.0)
Lymphocyte #: 0.5 10*3/uL — ABNORMAL LOW (ref 1.0–3.6)
MCH: 33.9 pg (ref 26.0–34.0)
MCV: 98 fL (ref 80–100)
Monocyte %: 6.3 %
Neutrophil %: 88.8 %
RDW: 12.5 % (ref 11.5–14.5)
WBC: 10.4 10*3/uL (ref 3.6–11.0)

## 2012-04-21 LAB — CBC WITH DIFFERENTIAL/PLATELET
Eosinophil #: 0 10*3/uL (ref 0.0–0.7)
Eosinophil %: 0.1 %
HCT: 37.4 % (ref 35.0–47.0)
HGB: 12.8 g/dL (ref 12.0–16.0)
Lymphocyte #: 0.5 10*3/uL — ABNORMAL LOW (ref 1.0–3.6)
MCHC: 34.1 g/dL (ref 32.0–36.0)
MCV: 98 fL (ref 80–100)
Monocyte %: 8.3 %
Neutrophil #: 7.5 10*3/uL — ABNORMAL HIGH (ref 1.4–6.5)
Neutrophil %: 85.3 %
Platelet: 229 10*3/uL (ref 150–440)
RDW: 12.5 % (ref 11.5–14.5)

## 2012-04-21 LAB — BASIC METABOLIC PANEL
Anion Gap: 6 — ABNORMAL LOW (ref 7–16)
Calcium, Total: 8.1 mg/dL — ABNORMAL LOW (ref 8.5–10.1)
Creatinine: 0.47 mg/dL — ABNORMAL LOW (ref 0.60–1.30)
EGFR (Non-African Amer.): >60
Glucose: 98 mg/dL (ref 65–99)
Potassium: 4 mmol/L (ref 3.5–5.1)

## 2012-04-22 LAB — BASIC METABOLIC PANEL
BUN: 12 mg/dL (ref 7–18)
Calcium, Total: 8 mg/dL — ABNORMAL LOW (ref 8.5–10.1)
Chloride: 101 mmol/L (ref 98–107)
Co2: 29 mmol/L (ref 21–32)
EGFR (African American): >60
EGFR (Non-African Amer.): >60
Osmolality: 275 (ref 275–301)
Sodium: 138 mmol/L (ref 136–145)

## 2012-04-22 LAB — CBC WITH DIFFERENTIAL/PLATELET
Basophil %: 0.5 %
HCT: 36.5 % (ref 35.0–47.0)
Lymphocyte #: 1.1 10*3/uL (ref 1.0–3.6)
Lymphocyte %: 13.5 %
MCH: 33.2 pg (ref 26.0–34.0)
MCHC: 34.1 g/dL (ref 32.0–36.0)
MCV: 98 fL (ref 80–100)
Neutrophil #: 6.1 10*3/uL (ref 1.4–6.5)
Neutrophil %: 71.7 %
RBC: 3.74 10*6/uL — ABNORMAL LOW (ref 3.80–5.20)
RDW: 12.1 % (ref 11.5–14.5)

## 2012-04-23 LAB — CBC WITH DIFFERENTIAL/PLATELET
Basophil %: 0.5 %
Eosinophil #: 0.3 10*3/uL (ref 0.0–0.7)
Eosinophil %: 3.5 %
HGB: 12.9 g/dL (ref 12.0–16.0)
Lymphocyte %: 7.1 %
MCH: 34.5 pg — ABNORMAL HIGH (ref 26.0–34.0)
MCHC: 35.6 g/dL (ref 32.0–36.0)
MCV: 97 fL (ref 80–100)
Monocyte #: 1.2 x10 3/mm — ABNORMAL HIGH (ref 0.2–0.9)
Monocyte %: 13 %
Neutrophil #: 7.2 10*3/uL — ABNORMAL HIGH (ref 1.4–6.5)
RBC: 3.73 10*6/uL — ABNORMAL LOW (ref 3.80–5.20)
WBC: 9.4 10*3/uL (ref 3.6–11.0)

## 2012-04-23 LAB — BASIC METABOLIC PANEL
Anion Gap: 6 — ABNORMAL LOW (ref 7–16)
BUN: 4 mg/dL — ABNORMAL LOW (ref 7–18)
Calcium, Total: 7.9 mg/dL — ABNORMAL LOW (ref 8.5–10.1)
Co2: 30 mmol/L (ref 21–32)
Creatinine: 0.48 mg/dL — ABNORMAL LOW (ref 0.60–1.30)
EGFR (African American): >60
EGFR (Non-African Amer.): >60
Glucose: 124 mg/dL — ABNORMAL HIGH (ref 65–99)
Osmolality: 272 (ref 275–301)
Potassium: 3 mmol/L — ABNORMAL LOW (ref 3.5–5.1)

## 2012-04-24 LAB — BASIC METABOLIC PANEL
Anion Gap: 4 — ABNORMAL LOW (ref 7–16)
Calcium, Total: 8.2 mg/dL — ABNORMAL LOW (ref 8.5–10.1)
Chloride: 104 mmol/L (ref 98–107)
Glucose: 100 mg/dL — ABNORMAL HIGH (ref 65–99)

## 2012-09-15 DIAGNOSIS — I10 Essential (primary) hypertension: Secondary | ICD-10-CM | POA: Diagnosis not present

## 2012-09-15 DIAGNOSIS — Z23 Encounter for immunization: Secondary | ICD-10-CM | POA: Diagnosis not present

## 2012-09-15 DIAGNOSIS — E7800 Pure hypercholesterolemia, unspecified: Secondary | ICD-10-CM | POA: Diagnosis not present

## 2012-09-15 DIAGNOSIS — G8921 Chronic pain due to trauma: Secondary | ICD-10-CM | POA: Diagnosis not present

## 2013-03-21 ENCOUNTER — Other Ambulatory Visit: Payer: Self-pay | Admitting: Family Medicine

## 2013-03-21 DIAGNOSIS — E7800 Pure hypercholesterolemia, unspecified: Secondary | ICD-10-CM | POA: Diagnosis not present

## 2013-03-21 DIAGNOSIS — Z1231 Encounter for screening mammogram for malignant neoplasm of breast: Secondary | ICD-10-CM

## 2013-03-21 DIAGNOSIS — Z1331 Encounter for screening for depression: Secondary | ICD-10-CM | POA: Diagnosis not present

## 2013-03-21 DIAGNOSIS — Z79899 Other long term (current) drug therapy: Secondary | ICD-10-CM | POA: Diagnosis not present

## 2013-03-21 DIAGNOSIS — I10 Essential (primary) hypertension: Secondary | ICD-10-CM | POA: Diagnosis not present

## 2013-03-21 DIAGNOSIS — IMO0002 Reserved for concepts with insufficient information to code with codable children: Secondary | ICD-10-CM | POA: Diagnosis not present

## 2013-03-21 DIAGNOSIS — Z124 Encounter for screening for malignant neoplasm of cervix: Secondary | ICD-10-CM | POA: Diagnosis not present

## 2013-03-21 DIAGNOSIS — Z0000 Encounter for general adult medical examination without abnormal findings: Secondary | ICD-10-CM | POA: Diagnosis not present

## 2013-04-16 ENCOUNTER — Ambulatory Visit
Admission: RE | Admit: 2013-04-16 | Discharge: 2013-04-16 | Disposition: A | Discharge status: Home / Self Care | Payer: Medicare Other | Source: Ambulatory Visit | Attending: Family Medicine | Admitting: Family Medicine

## 2013-04-16 DIAGNOSIS — Z1231 Encounter for screening mammogram for malignant neoplasm of breast: Secondary | ICD-10-CM | POA: Diagnosis not present

## 2013-07-02 DIAGNOSIS — H9209 Otalgia, unspecified ear: Secondary | ICD-10-CM | POA: Diagnosis not present

## 2013-09-27 DIAGNOSIS — R7401 Elevation of levels of liver transaminase levels: Secondary | ICD-10-CM | POA: Diagnosis not present

## 2013-09-27 DIAGNOSIS — E7800 Pure hypercholesterolemia, unspecified: Secondary | ICD-10-CM | POA: Diagnosis not present

## 2013-09-27 DIAGNOSIS — I10 Essential (primary) hypertension: Secondary | ICD-10-CM | POA: Diagnosis not present

## 2013-09-27 DIAGNOSIS — R7402 Elevation of levels of lactic acid dehydrogenase (LDH): Secondary | ICD-10-CM | POA: Diagnosis not present

## 2013-09-27 DIAGNOSIS — G8921 Chronic pain due to trauma: Secondary | ICD-10-CM | POA: Diagnosis not present

## 2014-04-01 ENCOUNTER — Other Ambulatory Visit: Payer: Self-pay | Admitting: Family Medicine

## 2014-04-01 DIAGNOSIS — M8580 Other specified disorders of bone density and structure, unspecified site: Secondary | ICD-10-CM

## 2014-04-01 DIAGNOSIS — E780 Pure hypercholesterolemia: Secondary | ICD-10-CM | POA: Diagnosis not present

## 2014-04-01 DIAGNOSIS — I10 Essential (primary) hypertension: Secondary | ICD-10-CM | POA: Diagnosis not present

## 2014-04-01 DIAGNOSIS — Z79899 Other long term (current) drug therapy: Secondary | ICD-10-CM | POA: Diagnosis not present

## 2014-04-01 DIAGNOSIS — Z1231 Encounter for screening mammogram for malignant neoplasm of breast: Secondary | ICD-10-CM | POA: Diagnosis not present

## 2014-04-01 DIAGNOSIS — G8921 Chronic pain due to trauma: Secondary | ICD-10-CM | POA: Diagnosis not present

## 2014-04-01 DIAGNOSIS — Z789 Other specified health status: Secondary | ICD-10-CM | POA: Diagnosis not present

## 2014-04-04 DIAGNOSIS — N390 Urinary tract infection, site not specified: Secondary | ICD-10-CM | POA: Diagnosis not present

## 2014-04-22 ENCOUNTER — Ambulatory Visit
Admission: RE | Admit: 2014-04-22 | Discharge: 2014-04-22 | Disposition: A | Discharge status: Home / Self Care | Payer: Medicare Other | Source: Ambulatory Visit | Attending: Family Medicine | Admitting: Family Medicine

## 2014-04-22 DIAGNOSIS — Z1231 Encounter for screening mammogram for malignant neoplasm of breast: Secondary | ICD-10-CM

## 2014-04-22 DIAGNOSIS — M85852 Other specified disorders of bone density and structure, left thigh: Secondary | ICD-10-CM | POA: Diagnosis not present

## 2014-04-22 DIAGNOSIS — M8580 Other specified disorders of bone density and structure, unspecified site: Secondary | ICD-10-CM

## 2014-04-22 DIAGNOSIS — M8588 Other specified disorders of bone density and structure, other site: Secondary | ICD-10-CM | POA: Diagnosis not present

## 2014-05-07 NOTE — Consult Note (Signed)
PATIENT NAME:  Brandy Haley, Brandy Haley MR#:  [id number] DATE OF BIRTH:  12-Sep-1955  DATE OF CONSULTATION:  10/12/2011  CONSULTING PHYSICIAN:  Manya Silvas, MD  REASON FOR CONSULTATION: The patient is a 59 year old white female who was in the hospital for three or four days, 09/17 to 09/20, for treatment for sigmoid diverticulitis shown on CAT scan. She was treated with Cipro and Flagyl, discharged home. She continued to have low-grade fever and abdominal discomfort, and then she started having dry heaves, worsening abdominal pain and nausea. She saw her primary care doctor, who ordered a repeat CAT scan of the abdomen that showed worsening diverticulitis with a small abscess; therefore, she was readmitted to the hospital. I was asked to see her in consultation. A CAT scan of the abdomen showed a 3 cm abscess near the rectum. I called Dr. Burt Knack. who looked at the CAT scan and said this was not amenable to needle drainage.   PAST MEDICAL HISTORY: Hypertension.   ALLERGIES: Hydrocodone.   HABITS: She never smoked, never drank. No illicit drug use. Both her parents were heavy alcohol users.   HOME MEDICATIONS:   1. Benazepril 5 mg a day.  2. Cipro 500 b.i.d.  3. Metronidazole 500 mg t.i.d.  4. Tramadol 50 mg t.i.d.    PAST SURGICAL HISTORY:  1. Gallbladder removal.  2. Multiple trauma from fall from 30 feet height. She broke 16 bones in her pelvis in four areas, vertebral fractures, multiple ribs, humerus. She also had laparoscopic surgery at that time to see if there was any internal damage. This was in 2001.   The patient was noted to have elevated liver functions on admission. I was also consulted for this matter. She says she has been on "lots of statins," and she has had various aches and pains of muscles and joints and did not tolerate them well.    FAMILY HISTORY: Negative for colon cancer, negative for colon polyps, a sister with Lyme disease.   The patient's foreign travel includes [place]  in 1994.   OB/GYN: The patient had four regular pregnancies without related problems. No eclampsia or preeclampsia.   REVIEW OF SYSTEMS: She had chills and fever yesterday, fatigue and weakness. No chest pains. No shortness of breath. No asthma, wheezing, or emphysema, chronic cough or shortness of breath. She does have abdominal pain in the left lower and right mid lower abdomen. Some nausea. No dysuria or hematuria. No kidney stones. No ankle edema. No anxiety or depression.   PHYSICAL EXAMINATION:  GENERAL: A white female in no acute distress.   VITAL SIGNS: Temperature 98.4, pulse 86, blood pressure 143/82, respirations 18, pulse oximetry 95% on room air.   HEENT: Sclerae are anicteric. Conjunctivae negative.   HEAD: Atraumatic. Tongue is negative.   NECK: No thyromegaly. Trachea is in the midline.   CHEST: Clear.   BREASTS: Exam not done.   ABDOMEN: Bowel sounds present. No hepatosplenomegaly. No masses. No bruits. There is tenderness in the left lower and right lower abdomen.   EXTREMITIES: No edema.   SKIN: Warm and dry.   PSYCHIATRIC: Mood and affect are appropriate and alert.   RECTAL: Exam not done.  LABORATORY, DIAGNOSTIC AND RADIOLOGICAL DATA: Glucose 73, BUN 6, creatinine 0.66, sodium 140, potassium 2.9, chloride 101, CO2 25, calcium 8.4, magnesium 1.8, total protein 6.1, albumin 2.5, total bilirubin 0.5, alkaline phosphatase 348, SGOT 102, SGPT 90. White count 7.6, hemoglobin 11.9, hematocrit 33.7, platelet count 303.  PT 13.2, PTT 31.8. A  blood culture showed no growth. Stool for C. difficile is negative. Urinalysis shows 2+ ketones. Chest x-ray shows minimal left lung base atelectasis ,recommends follow-up PA and lateral. CAT scan today shows worsening inflammatory changes in the pelvis consistent with sigmoid diverticulitis with a small perirectal abscess. No free air is apparent. Changes of previous cholecystectomy. Mild prominence of the common bile duct, bilateral  small pleural effusions.   ASSESSMENT: Diverticulitis with small abscess formation in an area not amenable to needle drainage. This 3 cm cyst may be treatable with IV antibiotics. It is possible that given the fact that she is in good health otherwise and relatively young consideration should be made for surgical resection of this. At any rate, currently she needs IV antibiotics to get over the diverticulitis portion. We will leave up to the surgeon the question of surgery or not for the 3 cm abscess.   Her elevated liver functions are interesting. This possibly could be related to inflammation via the blood stream to the liver from the diverticulitis and inflammation from microscopic bacterial irritation.   It is possible that she has occult hepatitis C related to her transfusions when she had her fall in 2001. Given her elevated alkaline phosphatase, it is possible that she could also have a primary biliary cirrhosis. We will order hepatitis A, B, and C panel, get an antimitochondrial antibody, check ANA and anti-smooth muscle antibodies and move from there. If she indeed does have exploratory lap and colon resection, a liver biopsy may be very helpful.   I will follow with you.  ____________________________ Manya Silvas, MD rte:cbb D: 10/12/2011 17:50:15 ET T: 10/12/2011 18:34:29 ET JOB#: [id number]  cc: Manya Silvas, MD, <Dictator> Maura L. Hamrick, MD Manya Silvas MD ELECTRONICALLY SIGNED 10/30/2011 11:27

## 2014-05-07 NOTE — Consult Note (Signed)
CC: diverticulitis with small abcess on CT.  Pt feels better today, minimal abd tenderness, definitely less than yesterday. Had liquid bowel movements today.  Abd exam with normal bowel sounds, less tender on palpation and light percussion.  Difficult to know best course to plot, given improvement I would keep her on antibiotics and repeat CT in a few days, may still need surgery but given improvement I think it is worth while to continue antibiotics for now.  Electronic Signatures: Manya Silvas (MD)  (Signed on 25-Sep-13 15:29)  Authored  Last Updated: 25-Sep-13 15:29 by Manya Silvas (MD)

## 2014-05-07 NOTE — Discharge Summary (Signed)
PATIENT NAME:  Brandy Haley, Brandy Haley MR#:  [id number] DATE OF BIRTH:  06/08/1955  DATE OF ADMISSION:  10/05/2011 DATE OF DISCHARGE:  10/08/2011  PRESENTING COMPLAINT: Abdominal pain.   DISCHARGE DIAGNOSES:  1. Sigmoid diverticulitis.  2. Hypertension.   CONDITION ON DISCHARGE: Fair.   MEDICATIONS:  1. Benazepril 5 mg daily.  2. Tramadol 50 mg daily as needed for pain.  3. Norco 5/325 one p.o. t.i.d. p.r.n.  4. Cipro 500 mg b.i.d.  5. Flagyl 500 mg t.i.d. for a total of six days.   DIET: Regular.  FOLLOW-UP: Follow-up with your primary care physician at [facility] in 1 to 2 weeks.   LABORATORY, DIAGNOSTIC, AND RADIOLOGICAL DATA: White count 9.7. Basic metabolic panel within normal limits. Urinalysis negative for urinary tract infection.   CT of the abdomen consistent with diverticulitis in the sigmoid colon.   BRIEF SUMMARY OF HOSPITAL COURSE: [name] is a pleasant 59 year old Caucasian female with history of hypertension who came in with abdominal pain. She was admitted with:  1. Acute sigmoid diverticulitis as noted clinically and radiologically on CT of the abdomen. She was started on IV Cipro and Flagyl which was changed to p.o. Cipro and Flagyl once patient was tolerating regular diet. P.o. pain meds were added.  2. Hypertension. Continued benazepril.  3. History of traumatic fall resulting in multiple broken bones many years ago. The patient takes tramadol as needed for pain.  4. Normocytic anemia.   Hospital stay otherwise remained stable.   CODE STATUS: The patient remained a FULL CODE.   TIME SPENT: 40 minutes.   ____________________________ Hart Rochester Posey Pronto, MD sap:drc D: 10/10/2011 07:30:32 ET T: 10/11/2011 16:40:57 ET JOB#: [id number]  cc: Clayton Bosserman A. Posey Pronto, MD, <Dictator> [facility]  Ilda Basset MD ELECTRONICALLY SIGNED 10/12/2011 16:10

## 2014-05-07 NOTE — H&P (Signed)
PATIENT NAME:  Brandy Haley, Brandy Haley MR#:  [id number] DATE OF BIRTH:  January 09, 1956  DATE OF ADMISSION:  10/05/2011  PRIMARY CARE PHYSICIAN: Dr. Daiva Eves  CHIEF COMPLAINT: Abdominal pain, nausea, and vomiting.   HISTORY OF PRESENT ILLNESS: This is a 59 year old female who presents to the hospital complaining of acute onset of abdominal pain that began around 4:00 this morning. Patient describes the pain as being sharp and episodic in nature located in the lower part of her abdomen below her umbilicus. Patient says the pain also radiated at time to her back. It is associated with multiple episodes of nausea and vomiting which has been nonbloody and bilious in nature. She also developed some chills and a headache and her symptoms were not improving and therefore she came to the ER for further evaluation. Patient underwent a CT of abdomen and pelvis which was suggestive of acute sigmoid diverticulitis. Hospitalist services were contacted for further treatment and evaluation.   REVIEW OF SYSTEMS: CONSTITUTIONAL: No documented fever. No weight gain, no weight loss. EYES: No blurry or double vision. ENT: No tinnitus. No postnasal drip. No redness of the oropharynx. RESPIRATORY: No cough, no wheeze, no hemoptysis. CARDIOVASCULAR: No chest pain, no orthopnea, no palpitations, no syncope. GASTROINTESTINAL: Positive nausea. Positive vomiting. Positive abdominal pain. No melena, no hematochezia, no diarrhea. GENITOURINARY: No dysuria, no hematuria. ENDOCRINE: No polyuria or nocturia. No heat or cold intolerance. HEME: No anemia, no bruising, no bleeding. INTEGUMENTARY: No rashes. No lesions. MUSCULOSKELETAL: No arthritis, no swelling, no gout. NEUROLOGIC: No numbness, no tingling, no ataxia. No seizure-type activity. PSYCH: No anxiety, no insomnia, no ADD.   PAST MEDICAL HISTORY:  1. Hypertension.  2. History of a traumatic fall many years ago resulting in multiple broken bones.   ALLERGIES: No known drug allergies.    SOCIAL HISTORY: No smoking. No alcohol abuse. No illicit drug abuse. Lives at home by herself.   FAMILY HISTORY: Patient's mother died at the age 48 due to a cancer of unknown type. Patient's father died from multiorgan failure but he has a history of heavy alcohol abuse and also was hypertensive.   CURRENT MEDICATIONS:  1. Benazepril 5 mg daily.  2. Tramadol 50 mg q.6 hours as needed.   PHYSICAL EXAMINATION ON ADMISSION: VITAL SIGNS: Patient's vital signs are noted to be: Temperature 98.8, pulse 95, respirations 20, blood pressure 120/80, sats 99% on room air.   GENERAL: She is a pleasant appearing female but in no apparent distress.   HEENT: She is atraumatic, normocephalic. Her extraocular muscles are intact. Her pupils are equal, reactive to light. Her sclerae are anicteric. No conjunctival injection. No oropharyngeal erythema.   NECK: Supple. There is no jugular venous distention. No bruits, no lymphadenopathy, no thyromegaly.   HEART: Regular rate and rhythm. No murmurs, no rubs, no clicks.   LUNGS: Clear to auscultation bilaterally. No rales, no rhonchi, no wheezes.   ABDOMEN: Soft, flat, tender in the hypogastric area but no rebound, no rigidity. Hypoactive bowel sounds. No hepatosplenomegaly appreciated.   EXTREMITIES: No evidence of any cyanosis, clubbing, or peripheral edema. Has +2 pedal and radial pulses bilaterally.   NEUROLOGICAL: Patient is alert, awake, oriented x3 with no focal motor or sensory deficits appreciated bilaterally.   SKIN: Moist, warm with no rash appreciated.   LYMPHATIC: There is no cervical or axillary lymphadenopathy.   LABORATORY, DIAGNOSTIC, AND RADIOLOGICAL DATA: Serum glucose 126, BUN 18, creatinine 0.5, sodium 136, potassium 4.3, chloride 106, bicarbonate 25, white cell count 12.6, hemoglobin 13.7,  hematocrit 39.3, platelet count 202. Urinalysis is within normal limits.   Patient did have a CT scan of the abdomen and pelvis done with  contrast suggestive of acute sigmoid diverticulitis but no drainable loculated fluid collection or pneumoperitoneum.   ASSESSMENT AND PLAN: This is a 59 year old female with a history of hypertension, history of traumatic fall many years ago resulting in multiple broken bones presents to the hospital with abdominal pain, nausea, vomiting and noted to have acute sigmoid diverticulitis.  1. Acute sigmoid diverticulitis. Continue supportive care with IV fluids, antiemetics and pain control. Will start the patient on IV ciprofloxacin and Flagyl. CT scan does not show any evidence of pneumoperitoneum or fluid collection. Keep her n.p.o. for now except for ice and medications and follow her clinically.  2. Hypertension. Continue benazepril.  3. History of a traumatic fall resulting in multiple broken bones. Patient takes tramadol as needed at home for pain. Currently she is going to be on morphine therefore I will hold her tramadol for now.  4. CODE STATUS: Patient is a FULL CODE.    TIME SPENT: 45 minutes.   ____________________________ Belia Heman. Verdell Carmine, MD vjs:cms D: 10/05/2011 20:14:05 ET T: 10/06/2011 06:30:38 ET JOB#: [id number]  cc: Belia Heman. Verdell Carmine, MD, <Dictator> Maura L. Hamrick, MD  Henreitta Leber MD ELECTRONICALLY SIGNED 10/15/2011 12:16

## 2014-05-07 NOTE — Consult Note (Signed)
PATIENT NAME:  Brandy Haley, Brandy Haley MR#:  [id number] DATE OF BIRTH:  [birth year]  DATE OF CONSULTATION:  10/11/2011  REFERRING PHYSICIAN:   CONSULTING PHYSICIAN:  Kennesha Brewbaker A. Brad Lieurance, MD  CHIEF COMPLAINT: Unresolving left lower quadrant and suprapubic abdominal pain x6 days, being treated for diverticulitis.   HISTORY OF PRESENT ILLNESS: Brandy Haley is a pleasant 59 year old female who had acute onset of left lower quadrant suprapubic abdominal pain and nausea, vomiting. On October 05, 2011 she had a CT scan which showed diverticulitis. She also had leukocytosis at this time. She was admitted, begun on IV Cipro and Flagyl. Over the next few days her pain resolved, and she began tolerating p.o. She was transitioned to p.o. antibiotics and then was discharged. She said that the day of discharge she did have a fever of 102. She felt better, but then since then has continued to have worsening left lower quadrant pain and poor PO intake. She says she initially had some nausea and vomiting but has not thrown up in about a week. She did have dry heaving. Her last bowel movement was today after her IV contrast. Does report subjective chills and subjective chest pain and shortness of breath, difficulty with urinating. No recent fevers, cough, nausea, vomiting, diarrhea, hematuria, hematemesis, melena.   PAST MEDICAL HISTORY 1.) Diverticulitis, diverticulosis 2.) Hypertension 3.) Status post cholecystectomy 8 years ago. 4.) History of a tubal ligation 25 years ago. 5.) History of a fall from 16 feet with multiple broken fractures and requiring what looks like a laparoscopy in 2001 for "internal bleeding)  ALLERGIES: No known drug allergies.   SOCIAL HISTORY: Has never smoked and never drinks. No drug use. Lives at home by herself.  Has a son who is getting married on Oct 5.  FAMILY HISTORY: Mother died at age 74 from mouth cancer which sounds like it was widely metastatic.  Father had a history of heavy  alcohol use, was hypertensive, and had a history of stroke, coronary artery disease, EtOH abuse.   CURRENT MEDICINES:  Ciprofloxacin Flagyl Tramadol p.r.n. pain Benazepril.   PHYSICAL EXAMINATION: VITAL SIGNS: Temperature 99.1, pulse 84, blood pressure 170/90, respirations 18, 98% on room air.   GENERAL: No acute distress, alert and oriented x3.   HEAD: Normocephalic, atraumatic.   EYES: No jaundice. No scleral icterus.   FACE: Normal external ears, normal external nose.   NECK: No obvious masses visualized.   CHEST: Lungs clear to auscultation, moving air well.   HEART: Regular rate and rhythm. No murmurs, rubs or gallops.   ABDOMEN: Soft, nondistended.  Tender in suprapubic area left>right. Has midline scars under umbilicus.   EXTREMITIES: Moves all extremities well. Strength five out of five all 4 extremities  LABORATORY DATA: Labs are significant for a white cell count of 8.5 with 75% neutrophils; hemoglobin and hematocrit and platelets are unremarkable. Chemistry is significant for a potassium of 3.1. Also of note, alkaline phosphatase is 394, ALT is 115, AST is 137.   CT: I personally reviewed her CT.  It does show pericolonic stranding and contained air and an approximately 3 x 3 cm rectal abscess.   ASSESSMENT AND PLAN: Brandy Haley is a 59 year old female with what sounds like recurrent diverticulitis. She is nontoxic. If she fails to resolve she may require sigmoid colectomy with Hartmann's pouch. She would like to try nonoperative management at this point.  I would recommend this, and we will continue to follow with her.   ____________________________ Glena Norfolk. Dathan Attia, MD cal:vtd  D: 10/11/2011 21:15:40 ET T: 10/12/2011 09:50:03 ET  JOB#: [id number]  cc: Harrell Gave A. Lajuanda Penick, MD, <Dictator> Floyde Parkins MD ELECTRONICALLY SIGNED 10/12/2011 20:02

## 2014-05-07 NOTE — H&P (Signed)
PATIENT NAME:  Brandy Haley, Brandy Haley MR#:  [id number] DATE OF BIRTH:  Feb 18, 1955  DATE OF ADMISSION:  10/25/2011  CHIEF COMPLAINT: Abdominal pain.   HISTORY OF PRESENT ILLNESS: This is a patient with recurrent diverticulitis. I was asked to see in the Emergency Room for recurrent diverticulitis. She has been admitted to the hospital multiple times and this will be her third admission in the last few weeks for diverticulitis with microperforation. It appears that when she goes on oral antibiotics and is discharged doing well she returns promptly with worsening pain and today it was a minimal pain but fevers to 101.5 that brought her back to the Emergency Room. A CT scan was performed showing no significant changes and I was asked to see the patient for admission and IV antibiotics. The patient describes minimal pain but fevers. No melena or hematochezia. No nausea or vomiting. She points to the left lower quadrant and suprapubic area for maximum pain.   PAST MEDICAL HISTORY:  1. Hypertension.  2. Diverticulitis.   PAST SURGICAL HISTORY: None.   ALLERGIES: Hydrocodone causes chest tightness.   MEDICATIONS:  1. Benazepril. 2. Cipro. 3. Flagyl. 4. Tramadol.   SOCIAL HISTORY: She does not smoke or drink and lives at home.  REVIEW OF SYSTEMS: 10 system was reviewed in the Emergency Room and negative with the exception of that mentioned in the history of present illness.     PHYSICAL EXAMINATION:   GENERAL: Healthy, comfortable-appearing Caucasian female patient.  VITAL SIGNS: Temperature 98.6, pulse 98, respirations 20, blood pressure 85/51, pain scale 5, 97% room air sat.   HEENT: No scleral icterus.   NECK: No palpable neck nodes.   CHEST: Clear to auscultation.   CARDIAC: Regular rate and rhythm.   ABDOMEN: Soft, minimally tender in the left lower quadrant. No peritoneal signs. No rebound or percussion tenderness.   EXTREMITIES: Without edema.   NEUROLOGIC: Grossly intact.    INTEGUMENTARY: No jaundice.   LABORATORY, DIAGNOSTIC, AND RADIOLOGICAL DATA: White blood cell count 12.7, hemoglobin and hematocrit 12.4 and 36.2, platelet count 506. Electrolytes are within normal limits, however, her AST and ALT are elevated as is her alkaline phosphatase, sodium 135.   CT scan is personally reviewed demonstrating diverticulitis. No drainable abscess and phlegmon present.   ASSESSMENT AND PLAN: This is a patient with recurrent diverticulitis. She seems to fail oral trials when discharged and has been readmitted now. This will be her third hospitalization in a short period of time. I discussed with her the need for IV antibiotics at this point and if this does not promptly resolve she may require surgery because of her multiple episodes.    While we would try to prep her and perform a primary anastomosis, if she has significant phlegmon or she does not respond promptly, then a colostomy may be in order. She will be admitted to the hospital and attempted trial at IV antibiotics will be performed.   ____________________________ Jerrol Banana. Burt Knack, MD rec:drc D: 10/25/2011 17:22:39 ET T: 10/25/2011 17:44:59 ET JOB#: [id number]  cc: Jerrol Banana. Burt Knack, MD, <Dictator> Florene Glen MD ELECTRONICALLY SIGNED 10/26/2011 16:12

## 2014-05-07 NOTE — Op Note (Signed)
PATIENT NAME:  Brandy Haley, Brandy Haley MR#:  [id number] DATE OF BIRTH:  02-Sep-1955  DATE OF PROCEDURE:  10/29/2011  PREOPERATIVE DIAGNOSIS: Recurrent diverticulitis.   POSTOPERATIVE DIAGNOSIS: Recurrent diverticulitis.  PROCEDURE PERFORMED: Hartmann's procedure with end colostomy.   SURGEON: Phoebe Perch, MD  ASSISTANT: Leron Croak, PA-S  ANESTHESIA: General with endotracheal tube.   INDICATIONS: This is a patient with multiple recurrent bouts of severe diverticulitis with microperforation and abscess. She is here after being hospitalized, this her third time in several weeks, with recurrent episodes of diverticulitis and perforation. She has been on antibiotics for several days and this is going to be an attempt at resection with primary closure avoiding a colostomy, however see below. We discussed the risks of bleeding, infection, colostomy or ileostomy, recurrent disease, anastomotic leak, and the considerable risk of either colostomy or an anastomotic leak in this patient with multiple episodes being performed during the acute phase for a patient who has failed multiple episodes of IV and oral antibiotics.   FINDINGS: Three separate areas of inflammatory process. The perforation on the sigmoid colon was well down in the pelvis, adherent to the rectum in two separate areas and to the cervix and that amount of inflammation was so intense that closure with anastomosis would have been very risky and the actual serosal or anterior side of the rectum was not identifiable. Therefore, a Hartmann's procedure with end colostomy was performed.   DESCRIPTION OF PROCEDURE: The patient was induced to general anesthesia. She was on IV antibiotics and re-dosed. VTE prophylaxis was in place. She was then placed into a well-padded, low lithotomy position and a Foley catheter was placed.  The patient was prepped and draped in a sterile fashion. A midline incision was utilized in the infraumbilical area and extended to  the umbilicus and around the left side of the umbilicus to open and explore the abdominal cavity. Minimal adhesions were noted, however, one area of deserosalization due to adhesion was identified during the dissection and this was reinforced with 3-0 silk. There was no sign of luminal entry in the small bowel and repair was adequate.   Further dissection identified the left colon and sigmoid colon being fairly normal. However down into the pelvis the sigmoid colon was looped and fairly redundant behind the uterus. Finger fracture dissection was performed to elevate the sigmoid colon out of the pelvis from behind the uterus. Ultimately three separate sections of severe inflammatory response was identified on the sigmoid colon where the likely primary disease was present and two areas down in the distal rectum, low in the pelvis and adherent to the cervix was identified. The anterior surface of the rectum could not even be identified due to the severe inflammatory process. With these findings it was decided to perform a Hartmann's procedure rather than a primary anastomosis.   Hartmann's procedure was performed by dividing proximal to the sigmoid inflamed area with a GIA. Mesentery was taken down and the left ureter was identified and protected at all times. The mesentery was taken down between clamps and ligated with 0 silk ligatures. Proximal to the two areas in the rectum, at the rectosigmoid junction, a TA stapler was fired to remove the remainder of the sigmoid colon, which was sent off for examination.   Hemostasis was maintained at this point. Irrigation with copious amounts of normal saline was performed. Further inspection confirmed the presence of severe inflammatory tissue, on the rectum from the rectosigmoid junction down well into the pelvis at the cervix.  The colostomy site was chosen, a cruciate incision was made in the fascia, the peritoneum was opened posterior to the rectus fascia and the  left colon was brought up into the colostomy wound.   Once assuring that hemostasis was correct and the sponge, lap and needle counts were correct, the omentum was placed down anterior to the rectum behind the uterus and prior to this 2-0 Prolene was utilized to tag the rectal stump for further marking in the future.  Seprafilm was placed and then the wound was closed with running #1 PDS, irrigated with copious amounts of normal saline, and then skin staples were placed.   Maturation of the colostomy was then performed in a standard fashion utilizing 3-0 Vicryl.   Digital exam demonstrated a patent colostomy without twist.   Sterile dressings were placed and a colostomy bag was placed.   The patient tolerated this procedure well, there were no complications, and she was taken to the recovery room in stable condition to be admitted for continued care. Sponge, lap and needle counts were correct. Estimated blood loss 100 mL. ____________________________ Jerrol Banana. Burt Knack, MD rec:slb D: 10/29/2011 13:23:23 ET T: 10/29/2011 13:38:52 ET JOB#: [id number]  cc: Jerrol Banana. Burt Knack, MD, <Dictator> Florene Glen MD ELECTRONICALLY SIGNED 10/29/2011 17:56

## 2014-05-07 NOTE — Discharge Summary (Signed)
PATIENT NAME:  Brandy Haley, Brandy Haley MR#:  [id number] DATE OF BIRTH:  11-04-1955  DATE OF ADMISSION:  10/11/2011 DATE OF DISCHARGE:  10/16/2011  BRIEF HISTORY: Brandy Haley is a 59 year old woman with persistent diverticulitis readmitted to the hospital on the 23rd after outpatient management failure. The patient was originally admitted on 20th and was home for 24 hours and at  that time her symptoms recurred. Repeat CT scan revealed a small pelvic abscess. She was placed back on IV antibiotics and operative options were discussed with her in detail. She did not want to consider surgical intervention at this time since it meant the creation of a temporary colostomy. She was maintained on p.o. antibiotics and pain medications and then moved to IV antibiotics as an inpatient. She was  treated with Cleocin and Zosyn. Her symptoms improved very slowly. She remained febrile for the first 24 hours, and then her temperature resolved and white blood cell count came down to normal. Abdominal exam improved. She is tolerating a regular diet. She has been 24 hours now on p.o. antibiotics and is discharged home to be followed with me in the office in 7 to 10 days' time. Bathing, activity, and driving instructions were given to the patient.   DISCHARGE MEDICATIONS:  1. Cephalexin 500 mg p.o. every 8 hours.  2. Clindamycin 300 mg p.o. every 8 hours. 3. Tramadol 100 mg p.o. every 6 hours p.r.n.   DISCHARGE DIAGNOSIS: Diverticulitis with microperforation and abscess formation.  ____________________________ Rodena Goldmann III, MD rle:cbb D: 10/16/2011 14:49:16 ET T: 10/17/2011 16:22:05 ET JOB#: [id number]  cc: Micheline Maze, MD, <Dictator> Manya Silvas, MD Rodena Goldmann MD ELECTRONICALLY SIGNED 10/20/2011 15:10

## 2014-05-07 NOTE — Consult Note (Signed)
CC: diverticulitis with abcess.  Pt has no pain with coughing, some tenderness with palpation in LLQ.  Pain pump used only once this afternoon.  Her liver tests do not indicate any intrinsic liver disease.  Maybe from seeding from the diverticulitis.  Changed to oral meds.  Dr. Gustavo Lah is on call for me this weekend but will not see unless you call with a problem,  I will return Monday.  Electronic Signatures: Manya Silvas (MD)  (Signed on 27-Sep-13 18:40)  Authored  Last Updated: 27-Sep-13 18:40 by Manya Silvas (MD)

## 2014-05-07 NOTE — Consult Note (Signed)
CC: diverticulitis with abcess.  Pt seems to be making slow gradual improvement from where she was when she came in.  LFT's down , now alk phos is only one up (300).  ANA neg, Hep A,B,C are neg.  WBC down to 10.  Abd still mildly tender on palpation.  Minimal discomfort with coughing.  VSS afebrile.  She will need a repeat CT scan of her abd/pelvis to see if any improvement.  Difficult question as to optimal plans at this time.  Discussed case with Dr. Pat Patrick.  Question ultimately is surgery or not and to answer that we need to see how she does on a course of iv antibiotics and how willing she is to have the semi-elective surgery which may likely include a temporary colostomy.   Electronic Signatures: Manya Silvas (MD)  (Signed on 26-Sep-13 16:24)  Authored  Last Updated: 26-Sep-13 16:24 by Manya Silvas (MD)

## 2014-05-07 NOTE — H&P (Signed)
PATIENT NAME:  Brandy Haley, Brandy Haley MR#:  [id number] DATE OF BIRTH:  [birth year]  DATE OF ADMISSION:  10/11/2011  PRIMARY CARE PHYSICIAN:  Dr. Cristela Blue Hamrick.   REQUESTING PHYSICIAN: Dr. Renard Hamper.  CHIEF COMPLAINT: Abdominal pain, nausea, and poor oral intake.   HISTORY OF PRESENT ILLNESS: The patient is a 59 year old female who was recently diagnosed with sigmoid diverticulitis who is being admitted for worsening pain and diverticulitis with possible small abscess.   The patient was recently in the hospital from 09/17 to 09/20 when she was treated with Cipro and Flagyl for sigmoid diverticulitis. She was discharged home on Friday evening. She was doing well on Saturday although she did report low-grade fever on Friday of 100.2 and the same continued over the weekend with a low-grade fever which she did not bother, but starting Sunday she started having dry heaves, could not eat much and started feeling sick again with worsening abdominal pain and nausea. It continued today and went to see her primary care physician who ordered CT scan of the abdomen which showed sigmoid diverticulitis with small abscess for which she is being admitted for further evaluation and management as she could not manage at home.   PAST MEDICAL HISTORY: Hypertension.   ALLERGIES: Hydrocodone.   SOCIAL HISTORY: No smoking. No alcohol use. No illicit drug use.   FAMILY HISTORY: Mother died at age of 40 due to cancer of unknown type. Father died of multiorgan failure.   MEDICATIONS AT HOME:  1. Benazepril 5 mg p.o. daily.  2. Ciprofloxacin 500 mg p.o. twice a day for six days.  3. Metronidazole 500 mg p.o. 3 times a day for six days.  4. Tramadol 50 mg p.o. 3 times a day as needed.   PAST SURGICAL HISTORY:  1. Cholecystectomy.  2. Vertebral fracture with surgical repair.   REVIEW OF SYSTEMS: CONSTITUTIONAL: Positive for low-grade fever, fatigue, and weakness. EYES: No blurred or double vision. ENT: No tinnitus or ear pain.  RESPIRATORY: No cough, wheezing, or hemoptysis. CARDIOVASCULAR: No chest pain, orthopnea, or edema. GASTROINTESTINAL: Positive for nausea and dry heaves. No diarrhea. Positive for abdominal pain. GENITOURINARY: No dysuria or hematuria. ENDOCRINE: No polyuria or nocturia. HEMATOLOGY: No anemia or easy bruising. SKIN: No rash or lesion. MUSCULOSKELETAL: No arthritis or muscle cramp. NEUROLOGIC: No tingling, numbness, or weakness. PSYCHIATRIC: No history of anxiety or depression.   PHYSICAL EXAMINATION:  VITAL SIGNS: Temperature 97.1, heart rate 83 per minute, respirations 20 per minute, blood pressure 152/88. She is saturating 96% on room air.   GENERAL: The patient is a 59 year old female lying in the bed comfortably without any acute distress.   EYES: Pupils equal, round, and reactive to light and accommodation. No scleral icterus. Extraocular muscles intact.   HEENT: Head atraumatic, normocephalic. Oropharynx and nasopharynx clear.   NECK: Supple. No jugular venous distention. No thyroid enlargement or tenderness.    LUNGS: Clear to auscultation bilaterally. No wheezing, rales, rhonchi, or crepitation.   ABDOMEN: Soft, Tenderness present in the left and right lower quadrant. No guarding or rigidity. No organomegaly appreciated.   NEUROLOGIC: Cranial nerves II through XII intact. Muscle strength 5/5 in all extremities. Sensation intact.   PSYCHIATRIC: The patient is oriented to time, place and person x3.   SKIN: No obvious rash, lesion, or ulcer.   EXTREMITIES: No pedal edema, cyanosis or clubbing.   LABORATORY, RADIOLOGICAL AND DIAGNOSTIC DATA: Normal BMP except potassium of 3.1. Normal CBC. LFTs showed alkaline phosphatase of 394, AST of 137, ALT 115. Normal  coagulation panel.   CT scan of abdomen and pelvis with contrast showed worsening inflammatory changes in the pelvis consistent with sigmoid diverticulitis with small pre-rectal abscess. No free air. Status post cholecystectomy. Mild  prominence of common bile duct and intrahepatic biliary system. Bilateral small pleural effusions with presumed compressive atelectasis in the lower lobe.  IMPRESSION AND PLAN:  1. Worsening sigmoid diverticulitis with small abscess. Will change antibiotic to Zosyn IV. Start her on IV morphine for better pain control and get a surgical consult (discussed with Dr. Rexene Edison who has seen the patient and agreed with the management. He does not feel any surgical need at this time). If no improvment, may need CT guided drainage of abscess. 2. Hypertension. Will continue benazepril. Monitor her blood pressure closely.  3. Hypokalemia. We will replete and recheck. Check magnesium level also.  4. Elevated liver function tests of unknown etiology. She has had a cholecystectomy done in the past. I do not find any LFTs checked in the past, so we will repeat in the morning. If still high, consider further work-up including possible gastroenterology consultation.  5. CODE STATUS: FULL CODE.   TOTAL TIME TAKING CARE OF THIS PATIENT: 45 minutes.   ____________________________ Lucina Mellow. Manuella Ghazi, MD vss:ap D: 10/11/2011 22:04:55 ET T: 10/12/2011 07:06:17 ET JOB#: [id number]  cc: Aliciana Ricciardi S. Manuella Ghazi, MD, <Dictator> Maura L. Hamrick, MD Remer Macho MD ELECTRONICALLY SIGNED 10/13/2011 22:54

## 2014-05-07 NOTE — Discharge Summary (Signed)
PATIENT NAME:  Brandy Haley, SUCH MR#:  [id number] DATE OF BIRTH:  10-26-1955  DATE OF ADMISSION:  10/25/2011 DATE OF DISCHARGE:  11/07/2011  DIAGNOSES:  1. Acute diverticulitis.  2. Hypertension.   PROCEDURES: Exploratory laparotomy and Hartman's procedure with end colostomy.   HISTORY OF PRESENT ILLNESS/HOSPITAL COURSE: This is a patient with multiple episodes of recurrent diverticulitis. She has been hospitalized multiple times and it seems that every time she switches to oral antibiotics after a short course of IV antibiotics she worsens and recurs and relapses and requires readmission to the hospital due to pain, fevers, leukocytosis, etc. On this admission she was readmitted to the hospital only several days after being discharged where she had increasing left lower quadrant pain and low-grade fevers. A CT scan showed phlegmon with no abscess but she has had signs of microperforations in the past. She was placed in the hospital, placed on IV antibiotics and bowel rest with the understanding that with her multiple recurrences at some point an attempt at resection would need to be performed.   We discussed the rationale for starting IV antibiotics and trying to calm down the acute inflammatory process and then performing a bowel prep with the potential for a primary anastomosis. This was done and she was taken to the Operating Room but at the time of surgery there was so much intense inflammation and signs of prior perforation deep in her pelvis that an anastomosis could not safely be performed even with a protecting ileostomy therefore a Hartman's procedure was performed with sigmoid colon resection and end colostomy.   Postoperatively she did well, had a somewhat prolonged ileus but was ultimately tolerating a regular diet with good bowel function and ostomy output. She is discharged in stable condition to follow up in my office in 10 days. She is given oral  analgesics. ____________________________ Jerrol Banana Burt Knack, MD rec:cms D: 11/10/2011 19:16:06 ET T: 11/11/2011 10:32:13 ET  JOB#: [id number] cc: Jerrol Banana. Burt Knack, MD, <Dictator>  Florene Glen MD ELECTRONICALLY SIGNED 11/11/2011 19:39

## 2014-05-08 DIAGNOSIS — Z6823 Body mass index (BMI) 23.0-23.9, adult: Secondary | ICD-10-CM | POA: Diagnosis not present

## 2014-05-08 DIAGNOSIS — M545 Low back pain: Secondary | ICD-10-CM | POA: Diagnosis not present

## 2014-05-10 NOTE — Discharge Summary (Signed)
PATIENT NAME:  Brandy Haley, Brandy Haley MR#:  [id number] DATE OF BIRTH:  1955/11/21  DATE OF ADMISSION:  04/18/2012 DATE OF DISCHARGE:  04/26/2012  DISCHARGE DIAGNOSES:  1.  History of perforated diverticulitis.  2.  Hypertension.   PROCEDURE: Colostomy closure and appendectomy.   HISTORY OF PRESENT ILLNESS AND HOSPITAL COURSE: This is a patient who was had a Hartmann's procedure for perforated diverticulitis with abscess several months ago. She is here for elective colostomy closure. She was taken to the Operating Room where colostomy closure was performed in a standard fashion.  Appendectomy was performed as well. She made an uncomplicated postoperative recovery with the exception of postoperative ileus that required short-term nasogastric intubation and suction. She ultimately was tolerating a regular diet and was discharged in stable condition on oral analgesics to follow up in my office in 10 days.  ____________________________ Jerrol Banana Burt Knack, MD rec:cb D: 05/03/2012 20:33:34 ET T: 05/03/2012 21:21:51 ET JOB#: [id number]  cc: Jerrol Banana. Burt Knack, MD, <Dictator> Florene Glen MD ELECTRONICALLY SIGNED 05/03/2012 22:38

## 2014-05-10 NOTE — H&P (Signed)
PATIENT NAME:  Brandy Haley, Brandy Haley MR#:  [id number] DATE OF BIRTH:  09-Dec-1955  DATE OF ADMISSION:  04/18/2012  CONSULTING PHYSICIAN: Jerrol Banana. Burt Knack, MD   CHIEF COMPLAINT: History of diverticulitis.   HISTORY OF PRESENT ILLNESS: This is a patient with a history of recurrent diverticulitis ultimately resulting in perforation with abscess, where a Hartmann procedure with end colostomy was performed. This was back in October 2013. She made a noncomplicated postoperative recovery and is currently ready for closure of her colostomy at her request. She has been seen in the office multiple times.   She has had no recent fevers, chills or abdominal pain.   PAST MEDICAL HISTORY: Hypertension and recurrent diverticulitis.   PAST SURGICAL HISTORY: Hartmann's procedure with end colostomy.   ALLERGIES: HYDROCODONE.   MEDICATIONS: Benazepril and tramadol.   SOCIAL HISTORY: The patient is a nonsmoker, nondrinker.   REVIEW OF SYSTEMS: A 10-system review has been performed and negative with the exception of that mentioned in the history of present illness and is documented in the office chart.   PHYSICAL EXAMINATION: GENERAL: Healthy-appearing female patient.  HEENT: No scleral icterus.  NECK: No palpable neck nodes.  CHEST: Clear to auscultation.  CARDIAC: Regular rate and rhythm.  ABDOMEN: Soft, nontender. There is a left lower quadrant colostomy which is functional and nonischemic appearing.  EXTREMITIES: Without edema.  NEUROLOGIC: Grossly intact.  INTEGUMENT: No jaundice.   LABORATORY DATA:  Most recent laboratory values are within normal limits with a hemoglobin and hematocrit of 13 and 39, platelet count 237. Electrolytes are within normal limits.   ASSESSMENT AND PLAN: This is a patient here for elective colostomy closure. The rationale for closure has been discussed with the patient and she has requested closure at this date. The risks of bleeding, infection, anastomotic leak, the need for  additional surgery including colostomy or ileostomy have been reviewed with her. She understood and agreed to proceed.     ____________________________ Jerrol Banana Burt Knack, MD rec:cc D: 04/17/2012 17:15:26 ET T: 04/17/2012 18:03:42 ET JOB#: [id number]  cc: Jerrol Banana. Burt Knack, MD, <Dictator> Florene Glen MD ELECTRONICALLY SIGNED 04/18/2012 11:12

## 2014-05-10 NOTE — Op Note (Signed)
PATIENT NAME:  Brandy Haley, Brandy Haley MR#:  [id number] DATE OF BIRTH:  1955-07-31  DATE OF OPERATION: 04/18/2012   PREOPERATIVE DIAGNOSIS:  Colostomy.   POSTOPERATIVE DIAGNOSIS: Colostomy.   PROCEDURES:  1.  Colostomy closure.  2.  Appendectomy.  3.  Extensive adhesiolysis.   SURGEON: Phoebe Perch, M.D.   ANESTHESIA: General, with endotracheal tube.   ASSISTANT: Addison Lank, P.A.-S.   INDICATIONS: This is a patient with a history of ruptured diverticulitis with an abscess who had underwent a Hartmann's procedure, is now here for closure of her colostomy.   Preoperatively, we discussed the rationale for surgery, the options of observation, risks of bleeding, infection, recurrence of disease, temporary or permanent colostomy or ileostomy, and additional surgery. This was all reviewed for she and her family in the preop holding area. They understood and agreed to proceed.   FINDINGS: Extensive soft adhesions. No active diverticulitis. No recurrent abscess. Normal appendix.   DESCRIPTION OF PROCEDURE: The patient was induced to general anesthesia. A Foley catheter was placed. VTE prophylaxis was in place and IV antibiotics were in place. She was then prepped and draped in a sterile fashion in a low lithotomy position which was well-padded. A midline incision was utilized to open and explore the abdominal cavity. Extensive adhesions were taken down, either with electrocautery or with sharp dissection with Metzenbaum scissors, and much of these adhesions were soft and filmy, and easily pulled with traction without injury. The appendix was first identified and removed by dividing the base of the appendix between clamps as well as the mesoappendix between clamps and tying both the base of the appendix and the mesoappendix with 2-0 Vicryl ties.   The specimen was passed off for examination.   Attention was turned to the pelvis where multiple adhesions were taken down in a similar fashion. The Prolene  sutures that had been placed on the rectal stump were easily identified. The rectal stump was elevated. Dissection off of the sacral promontory was performed to allow for good access to the rectal stump.   Attention was turned to the colostomy where a lenticular-shaped incision was made on the skin. Dissection down through the subcutaneous tissues to the fascia was performed. The colostomy was inverted back into the abdominal cavity and adhesions were taken down from small bowel to colon to allow for good length. There was adequate length, without tension.   Colostomy closure and anastomosis was then performed by removing the skin and distal portion of the colostomy off of the colon and then incising with EEA sizers that easily accepted a 33, and a 33 anvil was placed into the colon and tied in with a pursestring of 3-0 Prolenes.   From below, a 2-fingered digital rectal exam was performed and then EEA sizers were easily admitted up to the rectal stump, not necessarily all the way to the prior staple line, but anteriorly the EEA sizer was placed and the spike was brought out through the anterior rectal stump, hooked to the anvil, and ensuring that there was no twist and no extraneous material between the anvil and the stapler. The stapler was closed, then fired. Donuts were inspected and found to be intact.   The pelvis was filled with warm saline and insufflation from below was performed, and no bubbling to suggest a leak was identified.   A single silk suture was placed anteriorly on the anastomosis, but no others were needed. The area was checked for hemostasis and found to be adequate. The sponge,  lap and needle count was correct. The small bowel was placed back over the colon in the pelvis, and then closure of the prior colostomy site was performed with figure-of-eight 0 Prolenes from inside and from outside to close the colostomy site.   Marcaine was infiltrated in the skin and subcutaneous  tissues, and then Seprafilm was placed, and then the wound was closed with running #1 PDS. The area was irrigated and then skin staples were placed. A sterile dressing was placed.    The patient tolerated the procedure well. There were no complications. She was taken to the recovery room in stable condition to be admitted for continued care.   Sponge, lap and needle counts were correct.    ____________________________ Jerrol Banana. Burt Knack, MD rec:dm D: 04/18/2012 09:26:00 ET T: 04/18/2012 09:41:36 ET JOB#: [id number]  cc: Jerrol Banana. Burt Knack, MD, <Dictator> Florene Glen MD ELECTRONICALLY SIGNED 04/18/2012 12:22

## 2014-10-02 DIAGNOSIS — Z79899 Other long term (current) drug therapy: Secondary | ICD-10-CM | POA: Diagnosis not present

## 2014-10-02 DIAGNOSIS — Z6823 Body mass index (BMI) 23.0-23.9, adult: Secondary | ICD-10-CM | POA: Diagnosis not present

## 2014-10-02 DIAGNOSIS — I10 Essential (primary) hypertension: Secondary | ICD-10-CM | POA: Diagnosis not present

## 2014-10-02 DIAGNOSIS — Z1389 Encounter for screening for other disorder: Secondary | ICD-10-CM | POA: Diagnosis not present

## 2014-10-02 DIAGNOSIS — G8921 Chronic pain due to trauma: Secondary | ICD-10-CM | POA: Diagnosis not present

## 2014-10-02 DIAGNOSIS — E780 Pure hypercholesterolemia: Secondary | ICD-10-CM | POA: Diagnosis not present

## 2014-12-02 DIAGNOSIS — J029 Acute pharyngitis, unspecified: Secondary | ICD-10-CM | POA: Diagnosis not present

## 2014-12-02 DIAGNOSIS — R05 Cough: Secondary | ICD-10-CM | POA: Diagnosis not present

## 2014-12-02 DIAGNOSIS — Z6822 Body mass index (BMI) 22.0-22.9, adult: Secondary | ICD-10-CM | POA: Diagnosis not present

## 2014-12-02 DIAGNOSIS — J019 Acute sinusitis, unspecified: Secondary | ICD-10-CM | POA: Diagnosis not present

## 2014-12-02 DIAGNOSIS — R509 Fever, unspecified: Secondary | ICD-10-CM | POA: Diagnosis not present

## 2015-04-02 ENCOUNTER — Other Ambulatory Visit: Payer: Self-pay | Admitting: Family Medicine

## 2015-04-02 DIAGNOSIS — Z1231 Encounter for screening mammogram for malignant neoplasm of breast: Secondary | ICD-10-CM | POA: Diagnosis not present

## 2015-04-02 DIAGNOSIS — I10 Essential (primary) hypertension: Secondary | ICD-10-CM | POA: Diagnosis not present

## 2015-04-02 DIAGNOSIS — G8921 Chronic pain due to trauma: Secondary | ICD-10-CM | POA: Diagnosis not present

## 2015-04-02 DIAGNOSIS — Z6823 Body mass index (BMI) 23.0-23.9, adult: Secondary | ICD-10-CM | POA: Diagnosis not present

## 2015-04-02 DIAGNOSIS — E7800 Pure hypercholesterolemia, unspecified: Secondary | ICD-10-CM | POA: Diagnosis not present

## 2015-04-24 ENCOUNTER — Ambulatory Visit
Admission: RE | Admit: 2015-04-24 | Discharge: 2015-04-24 | Disposition: A | Discharge status: Home / Self Care | Payer: Medicare Other | Source: Ambulatory Visit | Attending: Family Medicine | Admitting: Family Medicine

## 2015-04-24 DIAGNOSIS — Z1231 Encounter for screening mammogram for malignant neoplasm of breast: Secondary | ICD-10-CM | POA: Diagnosis not present

## 2015-05-19 ENCOUNTER — Inpatient Hospital Stay
Admission: EM | Admit: 2015-05-19 | Discharge: 2015-05-22 | DRG: 392 | Disposition: A | Discharge status: Home / Self Care | Payer: Medicare Other | Attending: Internal Medicine | Admitting: Internal Medicine

## 2015-05-19 ENCOUNTER — Emergency Department: Payer: Medicare Other

## 2015-05-19 DIAGNOSIS — A09 Infectious gastroenteritis and colitis, unspecified: Secondary | ICD-10-CM | POA: Diagnosis present

## 2015-05-19 DIAGNOSIS — L270 Generalized skin eruption due to drugs and medicaments taken internally: Secondary | ICD-10-CM | POA: Diagnosis present

## 2015-05-19 DIAGNOSIS — R112 Nausea with vomiting, unspecified: Secondary | ICD-10-CM | POA: Diagnosis not present

## 2015-05-19 DIAGNOSIS — D62 Acute posthemorrhagic anemia: Secondary | ICD-10-CM | POA: Diagnosis present

## 2015-05-19 DIAGNOSIS — I10 Essential (primary) hypertension: Secondary | ICD-10-CM | POA: Diagnosis present

## 2015-05-19 DIAGNOSIS — R197 Diarrhea, unspecified: Secondary | ICD-10-CM

## 2015-05-19 DIAGNOSIS — Z9049 Acquired absence of other specified parts of digestive tract: Secondary | ICD-10-CM | POA: Diagnosis not present

## 2015-05-19 DIAGNOSIS — Z8249 Family history of ischemic heart disease and other diseases of the circulatory system: Secondary | ICD-10-CM | POA: Diagnosis not present

## 2015-05-19 DIAGNOSIS — Z79899 Other long term (current) drug therapy: Secondary | ICD-10-CM

## 2015-05-19 DIAGNOSIS — K529 Noninfective gastroenteritis and colitis, unspecified: Secondary | ICD-10-CM

## 2015-05-19 DIAGNOSIS — K625 Hemorrhage of anus and rectum: Secondary | ICD-10-CM | POA: Diagnosis present

## 2015-05-19 DIAGNOSIS — Z933 Colostomy status: Secondary | ICD-10-CM | POA: Diagnosis not present

## 2015-05-19 DIAGNOSIS — R739 Hyperglycemia, unspecified: Secondary | ICD-10-CM | POA: Diagnosis present

## 2015-05-19 DIAGNOSIS — K559 Vascular disorder of intestine, unspecified: Secondary | ICD-10-CM | POA: Diagnosis not present

## 2015-05-19 DIAGNOSIS — R109 Unspecified abdominal pain: Secondary | ICD-10-CM | POA: Diagnosis not present

## 2015-05-19 DIAGNOSIS — R1033 Periumbilical pain: Secondary | ICD-10-CM | POA: Diagnosis not present

## 2015-05-19 DIAGNOSIS — I959 Hypotension, unspecified: Secondary | ICD-10-CM | POA: Diagnosis present

## 2015-05-19 DIAGNOSIS — Z7982 Long term (current) use of aspirin: Secondary | ICD-10-CM

## 2015-05-19 DIAGNOSIS — K921 Melena: Secondary | ICD-10-CM | POA: Diagnosis not present

## 2015-05-19 HISTORY — DX: Essential (primary) hypertension: I10

## 2015-05-19 LAB — GASTROINTESTINAL PANEL BY PCR, STOOL (REPLACES STOOL CULTURE)
ASTROVIRUS: NOT DETECTED
Adenovirus F40/41: NOT DETECTED
CYCLOSPORA CAYETANENSIS: NOT DETECTED
Campylobacter species: NOT DETECTED
Cryptosporidium: NOT DETECTED
E. COLI O157: NOT DETECTED
ENTEROTOXIGENIC E COLI (ETEC): NOT DETECTED
Entamoeba histolytica: NOT DETECTED
Enteroaggregative E coli (EAEC): NOT DETECTED
Enteropathogenic E coli (EPEC): NOT DETECTED
Giardia lamblia: NOT DETECTED
NOROVIRUS GI/GII: NOT DETECTED
Plesimonas shigelloides: NOT DETECTED
ROTAVIRUS A: NOT DETECTED
SAPOVIRUS (I, II, IV, AND V): NOT DETECTED
SHIGA LIKE TOXIN PRODUCING E COLI (STEC): NOT DETECTED
Salmonella species: NOT DETECTED
Shigella/Enteroinvasive E coli (EIEC): NOT DETECTED
VIBRIO SPECIES: NOT DETECTED
Vibrio cholerae: NOT DETECTED
Yersinia enterocolitica: NOT DETECTED

## 2015-05-19 LAB — COMPREHENSIVE METABOLIC PANEL
ALBUMIN: 3.9 g/dL (ref 3.5–5.0)
ALK PHOS: 134 U/L — AB (ref 38–126)
ALT: 81 U/L — AB (ref 14–54)
ANION GAP: 10 (ref 5–15)
AST: 46 U/L — ABNORMAL HIGH (ref 15–41)
BUN: 17 mg/dL (ref 6–20)
CHLORIDE: 105 mmol/L (ref 101–111)
CO2: 24 mmol/L (ref 22–32)
CREATININE: 0.74 mg/dL (ref 0.44–1.00)
Calcium: 9.3 mg/dL (ref 8.9–10.3)
GFR calc non Af Amer: >60 mL/min (ref >60)
GLUCOSE: 125 mg/dL — AB (ref 65–99)
Potassium: 3.8 mmol/L (ref 3.5–5.1)
SODIUM: 139 mmol/L (ref 135–145)
Total Bilirubin: 0.6 mg/dL (ref 0.3–1.2)
Total Protein: 8.1 g/dL (ref 6.5–8.1)

## 2015-05-19 LAB — CBC
HEMATOCRIT: 39.8 % (ref 35.0–47.0)
HEMOGLOBIN: 13.9 g/dL (ref 12.0–16.0)
MCH: 33.1 pg (ref 26.0–34.0)
MCHC: 34.9 g/dL (ref 32.0–36.0)
MCV: 94.6 fL (ref 80.0–100.0)
Platelets: 207 10*3/uL (ref 150–440)
RBC: 4.21 MIL/uL (ref 3.80–5.20)
RDW: 12.6 % (ref 11.5–14.5)
WBC: 9.1 10*3/uL (ref 3.6–11.0)

## 2015-05-19 LAB — TYPE AND SCREEN
ABO/RH(D): O POS
ANTIBODY SCREEN: NEGATIVE

## 2015-05-19 LAB — C DIFFICILE QUICK SCREEN W PCR REFLEX
C DIFFICILE (CDIFF) TOXIN: NEGATIVE
C Diff antigen: NEGATIVE
C Diff interpretation: NEGATIVE

## 2015-05-19 LAB — ABO/RH: ABO/RH(D): O POS

## 2015-05-19 MED ORDER — SODIUM CHLORIDE 0.9 % IV SOLN
INTRAVENOUS | Status: DC
  Administered 2015-05-19: 18:00:00 via INTRAVENOUS

## 2015-05-19 MED ORDER — FENTANYL CITRATE (PF) 100 MCG/2ML IJ SOLN
50.0000 ug | Freq: Once | INTRAMUSCULAR | Status: AC
  Administered 2015-05-19: 50 ug via INTRAVENOUS
  Filled 2015-05-19: qty 2

## 2015-05-19 MED ORDER — ACETAMINOPHEN 325 MG PO TABS
650.0000 mg | ORAL_TABLET | Freq: Four times a day (QID) | ORAL | Status: DC | PRN
  Filled 2015-05-19: qty 2

## 2015-05-19 MED ORDER — CALCIUM CARBONATE-VITAMIN D 500-200 MG-UNIT PO TABS
1.0000 | ORAL_TABLET | Freq: Every day | ORAL | Status: DC
  Administered 2015-05-20 – 2015-05-22 (×2): 1 via ORAL
  Filled 2015-05-19 (×2): qty 1

## 2015-05-19 MED ORDER — MORPHINE SULFATE (PF) 2 MG/ML IV SOLN
1.0000 mg | INTRAVENOUS | Status: DC | PRN
  Administered 2015-05-19 – 2015-05-21 (×3): 1 mg via INTRAVENOUS
  Filled 2015-05-19 (×3): qty 1

## 2015-05-19 MED ORDER — ACETAMINOPHEN 650 MG RE SUPP: 650.0000 mg | Freq: Four times a day (QID) | RECTAL | Status: DC | PRN

## 2015-05-19 MED ORDER — METRONIDAZOLE IN NACL 5-0.79 MG/ML-% IV SOLN
500.0000 mg | Freq: Two times a day (BID) | INTRAVENOUS | Status: DC
  Administered 2015-05-19 – 2015-05-22 (×6): 500 mg via INTRAVENOUS
  Filled 2015-05-19 (×8): qty 100

## 2015-05-19 MED ORDER — ONDANSETRON HCL 4 MG/2ML IJ SOLN
4.0000 mg | Freq: Once | INTRAMUSCULAR | Status: AC
  Administered 2015-05-19: 4 mg via INTRAVENOUS
  Filled 2015-05-19: qty 2

## 2015-05-19 MED ORDER — BENAZEPRIL HCL 5 MG PO TABS
5.0000 mg | ORAL_TABLET | Freq: Every day | ORAL | Status: DC
  Administered 2015-05-19: 5 mg via ORAL
  Filled 2015-05-19 (×2): qty 1

## 2015-05-19 MED ORDER — CIPROFLOXACIN IN D5W 400 MG/200ML IV SOLN
400.0000 mg | Freq: Two times a day (BID) | INTRAVENOUS | Status: DC
  Administered 2015-05-19 – 2015-05-22 (×6): 400 mg via INTRAVENOUS
  Filled 2015-05-19 (×7): qty 200

## 2015-05-19 MED ORDER — CIPROFLOXACIN IN D5W 400 MG/200ML IV SOLN
400.0000 mg | Freq: Two times a day (BID) | INTRAVENOUS | Status: DC
  Filled 2015-05-19 (×2): qty 200

## 2015-05-19 MED ORDER — SODIUM CHLORIDE 0.9 % IV BOLUS (SEPSIS)
1000.0000 mL | Freq: Once | INTRAVENOUS | Status: AC
  Administered 2015-05-19: 1000 mL via INTRAVENOUS

## 2015-05-19 MED ORDER — ONDANSETRON HCL 4 MG/2ML IJ SOLN
4.0000 mg | Freq: Four times a day (QID) | INTRAMUSCULAR | Status: DC | PRN
  Administered 2015-05-19: 4 mg via INTRAVENOUS
  Filled 2015-05-19: qty 2

## 2015-05-19 MED ORDER — IOPAMIDOL (ISOVUE-300) INJECTION 61%
80.0000 mL | Freq: Once | INTRAVENOUS | Status: AC | PRN
  Administered 2015-05-19: 80 mL via INTRAVENOUS

## 2015-05-19 MED ORDER — OXYCODONE HCL 5 MG PO TABS
5.0000 mg | ORAL_TABLET | ORAL | Status: DC | PRN
  Administered 2015-05-21: 5 mg via ORAL
  Filled 2015-05-19: qty 1

## 2015-05-19 MED ORDER — DIATRIZOATE MEGLUMINE & SODIUM 66-10 % PO SOLN
15.0000 mL | Freq: Once | ORAL | Status: AC
  Administered 2015-05-19: 15 mL via ORAL
  Filled 2015-05-19: qty 30

## 2015-05-19 MED ORDER — PROCHLORPERAZINE EDISYLATE 5 MG/ML IJ SOLN
10.0000 mg | Freq: Three times a day (TID) | INTRAMUSCULAR | Status: DC | PRN
  Administered 2015-05-19: 10 mg via INTRAVENOUS
  Filled 2015-05-19 (×2): qty 2

## 2015-05-19 MED ORDER — LIDOCAINE-EPINEPHRINE (PF) 1 %-1:200000 IJ SOLN
INTRAMUSCULAR | Status: AC
  Filled 2015-05-19: qty 30

## 2015-05-19 MED ORDER — ONDANSETRON HCL 4 MG PO TABS: 4.0000 mg | ORAL_TABLET | Freq: Four times a day (QID) | ORAL | Status: DC | PRN

## 2015-05-19 MED ORDER — MORPHINE SULFATE (PF) 4 MG/ML IV SOLN
4.0000 mg | Freq: Once | INTRAVENOUS | Status: AC
  Administered 2015-05-19: 4 mg via INTRAVENOUS
  Filled 2015-05-19: qty 1

## 2015-05-19 NOTE — ED Notes (Signed)
Patient transported to CT 

## 2015-05-19 NOTE — Plan of Care (Signed)
Problem: Bowel/Gastric: Goal: Will show no signs and symptoms of gastrointestinal bleeding Outcome: Not Progressing Pt has had 2 bloody BM's so far.

## 2015-05-19 NOTE — H&P (Signed)
[facility] [place] NAME: Brandy Haley    MR#:  [id number]  DATE OF BIRTH:  09-07-1955  DATE OF ADMISSION:  05/19/2015  PRIMARY CARE PHYSICIAN: Dr Lisbeth Ply in [place] REQUESTING/REFERRING PHYSICIAN: Dr. Burlene Arnt  CHIEF COMPLAINT:  Bloody diarrhea since yesterday 2:00pm  HISTORY OF PRESENT ILLNESS:  Brandy Haley  is a 60 y.o. female with a known history of perforated diverticulitis status post colostomy and takedown in 2013/2014, hypertension comes to the emergency room with bloody diarrhea that started yesterday at 2 PM. Patient said she had had about 15 episodes of bloody diarrhea along with nausea and vomiting. She does have significant amount of cramping pain. In the emergency room CT scan of the abdomen done which showed colitis. Patient received IV Cipro and Flagyl. She is being admitted for further evaluation and management.  PAST MEDICAL HISTORY:   Past Medical History  Diagnosis Date  . Hypertension     PAST SURGICAL HISTOIRY:   Past Surgical History  Procedure Laterality Date  . Colostomy    . Appendectomy    . Cholecystectomy    . Colostomy reversal      SOCIAL HISTORY:   Social History  Substance Use Topics  . Smoking status: Never Smoker   . Smokeless tobacco: Not on file  . Alcohol Use: No    FAMILY HISTORY:  HTN  DRUG ALLERGIES:  No Known Allergies  REVIEW OF SYSTEMS:  Review of Systems  Constitutional: Negative for fever, chills and weight loss.  HENT: Negative for ear discharge, ear pain and nosebleeds.   Eyes: Negative for blurred vision, pain and discharge.  Respiratory: Negative for sputum production, shortness of breath, wheezing and stridor.   Cardiovascular: Negative for chest pain, palpitations, orthopnea and PND.  Gastrointestinal: Positive for nausea, vomiting, abdominal pain, diarrhea and blood in stool.  Genitourinary: Negative for urgency and frequency.  Musculoskeletal: Negative  for back pain and joint pain.  Neurological: Positive for weakness. Negative for sensory change, speech change and focal weakness.  Psychiatric/Behavioral: Negative for depression and hallucinations. The patient is not nervous/anxious.   All other systems reviewed and are negative.    MEDICATIONS AT HOME:   Prior to Admission medications   Medication Sig Start Date End Date Taking? Authorizing Provider  aspirin 81 MG chewable tablet Chew 81 mg by mouth daily.   Yes Historical Provider, MD  benazepril (LOTENSIN) 10 MG tablet Take 5 mg by mouth daily.    Yes Historical Provider, MD  Calcium Carbonate-Vitamin D (CALCIUM 600+D) 600-400 MG-UNIT tablet Take 1 tablet by mouth daily.   Yes Historical Provider, MD      VITAL SIGNS:  Blood pressure 149/80, pulse 76, temperature 98.4 F (36.9 C), temperature source Oral, resp. rate 18, height 5\' 2"  (1.575 m), weight 58.968 kg (130 lb), SpO2 97 %.  PHYSICAL EXAMINATION:  GENERAL:  60 y.o.-year-old patient lying in the bed with no acute distress.  EYES: Pupils equal, round, reactive to light and accommodation. No scleral icterus. Extraocular muscles intact.  HEENT: Head atraumatic, normocephalic. Oropharynx and nasopharynx clear.  NECK:  Supple, no jugular venous distention. No thyroid enlargement, no tenderness.  LUNGS: Normal breath sounds bilaterally, no wheezing, rales,rhonchi or crepitation. No use of accessory muscles of respiration.  CARDIOVASCULAR: S1, S2 normal. No murmurs, rubs, or gallops.  ABDOMEN: Soft, diffuse tenderness, nondistended. Bowel sounds present. No organomegaly or mass.  EXTREMITIES: No pedal edema, cyanosis, or clubbing.  NEUROLOGIC: Cranial nerves II through  XII are intact. Muscle strength 5/5 in all extremities. Sensation intact. Gait not checked.  PSYCHIATRIC:  patient is alert and oriented x 3.  SKIN: No obvious rash, lesion, or ulcer.   LABORATORY PANEL:   CBC  Recent Labs Lab 05/19/15 0757  WBC 9.1  HGB  13.9  HCT 39.8  PLT 207   ------------------------------------------------------------------------------------------------------------------  Chemistries   Recent Labs Lab 05/19/15 0757  NA 139  K 3.8  CL 105  CO2 24  GLUCOSE 125*  BUN 17  CREATININE 0.74  CALCIUM 9.3  AST 46*  ALT 81*  ALKPHOS 134*  BILITOT 0.6   ------------------------------------------------------------------------------------------------------------------  Cardiac Enzymes No results for input(s): TROPONINI in the last 168 hours. ------------------------------------------------------------------------------------------------------------------  RADIOLOGY:  Ct Abdomen Pelvis W Contrast  05/19/2015  CLINICAL DATA:  Mid lower abdominal pain, rectal bleeding for 2 days. EXAM: CT ABDOMEN AND PELVIS WITH CONTRAST TECHNIQUE: Multidetector CT imaging of the abdomen and pelvis was performed using the standard protocol following bolus administration of intravenous contrast. CONTRAST:  23mL ISOVUE-300 IOPAMIDOL (ISOVUE-300) INJECTION 61% COMPARISON:  Plain films 04/22/2012.  CT 10/25/2011. FINDINGS: Lower chest: Linear dependent atelectasis in the lung bases. Heart is normal size. No effusions. Hepatobiliary: Prior cholecystectomy. Common bile duct is mildly prominent, likely related to post cholecystectomy state. No focal hepatic abnormality. Pancreas: No focal abnormality or ductal dilatation. Spleen: No focal abnormality.  Normal size. Adrenals/Urinary Tract: No adrenal abnormality. No focal renal abnormality. No stones or hydronephrosis. Urinary bladder is unremarkable. Stomach/Bowel: Postoperative changes in the region of the rectosigmoid colon. There is wall thickening within the colon from the mid transverse colon to the distal descending colon compatible with colitis. No evidence of bowel obstruction. Stomach and small bowel grossly unremarkable. Vascular/Lymphatic: Scattered aortic calcifications.  No aneurysm.  Reproductive: Uterus and adnexa unremarkable.  No mass. Other: No free fluid or free air. Musculoskeletal: No acute bony abnormality or focal bone lesion. IMPRESSION: Circumferential wall thickening involving the colon from the mid transverse colon to the distal descending colon compatible with colitis, likely inflammatory or infectious. Electronically Signed   By: Rolm Baptise M.D.   On: 05/19/2015 12:44    EKG:    IMPRESSION AND PLAN:   Brandy Haley  is a 60 y.o. female with a known history of perforated diverticulitis status post colostomy and takedown in 2013/2014, hypertension comes to the emergency room with bloody diarrhea that started yesterday at 2 PM. Patient said she had had about 15 episodes of bloody diarrhea along with nausea and vomiting.   1. Acute colitis -Admit to medical floor -CT scan suggestive of colitis involving mid transverse to distal descending colon -IV Cipro and Flagyl -GI consultation if symptoms not improve -Get GI panel  2. Hypertension Continue benazepril  3. DVT prophylaxis SCD teds Avoid antiplatelet secondary to GI bleed    All the records are reviewed and case discussed with ED provider. Management plans discussed with the patient, family and they are in agreement.  CODE STATUS: Full  TOTAL TIME TAKING CARE OF THIS PATIENT: 45 minutes.    Brandy Haley M.D on 05/19/2015 at 2:22 PM  Between 7am to 6pm - Pager - [phone]  After 6pm go to [email or web address] - password [email or web address]  [facility]  Office  [phone]  CC: Primary care physician; No primary care provider on file.

## 2015-05-19 NOTE — Progress Notes (Signed)
Pt came to floor at [place]. VSS. BP was a little high, but pt had not had her BP meds all day. Pt was Oriented to room and safety plan. BP meds given.

## 2015-05-19 NOTE — ED Notes (Signed)
Pt reports abdominal pain yesterday after eating lunch; reports vomiting and diarrhea, reports bright red rectal bleeding. Pt reports hx of colostomy in 2013 and closure in 2014.

## 2015-05-19 NOTE — ED Notes (Signed)
Report given to Shannon, RN

## 2015-05-19 NOTE — ED Notes (Signed)
Pt attempted to produce stool sample but was unable to do so.

## 2015-05-19 NOTE — ED Provider Notes (Addendum)
[facility] Emergency Department Provider Note  ____________________________________________   I have reviewed the triage vital signs and the nursing notes.   HISTORY  Chief Complaint Rectal Bleeding and Abdominal Pain    HPI Brandy Haley is a 60 y.o. female history of diverticular disease in the past of abscess and partial colectomy with a colostomy and colostomy reversal,history also of appendectomy and cholecystectomy states he ate dinner last night at a restaurant and on the way home she began to have nausea vomiting and copious diarrhea. She states the diarrhea gradually became bloody and she's had multiple bloody episodes of diarrhea. She denies any fever. She has had no hematemesis or recent antibiotics. She has diffuse abdominal pain mostly however on the left. She describes it as a cramping discomfort. Does not radiate.     Past Medical History  Diagnosis Date  . Hypertension     There are no active problems to display for this patient.   Past Surgical History  Procedure Laterality Date  . Colostomy    . Appendectomy    . Cholecystectomy    . Colostomy reversal      No current outpatient prescriptions on file.  Allergies Review of patient's allergies indicates no known allergies.  No family history on file.  Social History Social History  Substance Use Topics  . Smoking status: Never Smoker   . Smokeless tobacco: None  . Alcohol Use: No    Review of Systems Constitutional: No fever/chills Eyes: No visual changes. ENT: No sore throat. No stiff neck no neck pain Cardiovascular: Denies chest pain. Respiratory: Denies shortness of breath. Gastrointestinal:   See history of present illness  Genitourinary: Negative for dysuria. Musculoskeletal: Negative lower extremity swelling Skin: Negative for rash. Neurological: Negative for headaches, focal weakness or numbness. 10-point ROS otherwise  negative.  ____________________________________________   PHYSICAL EXAM:  VITAL SIGNS: ED Triage Vitals  Enc Vitals Group     BP 05/19/15 0752 137/94 mmHg     Pulse Rate 05/19/15 0752 83     Resp 05/19/15 0752 16     Temp 05/19/15 0752 98.4 F (36.9 C)     Temp Source 05/19/15 0752 Oral     SpO2 05/19/15 0752 98 %     Weight 05/19/15 0752 130 lb (58.968 kg)     Height 05/19/15 0752 5\' 2"  (1.575 m)     Head Cir --      Peak Flow --      Pain Score 05/19/15 0753 5     Pain Loc --      Pain Edu? --      Excl. in [place]? --     Constitutional: Alert and oriented, Appears uncomfortable mildly pale Eyes: Conjunctivae are normal. PERRL. EOMI. Head: Atraumatic. Nose: No congestion/rhinnorhea. Mouth/Throat: Mucous membranes are moist.  Oropharynx non-erythematous. Neck: No stridor.   Nontender with no meningismus Cardiovascular: Normal rate, regular rhythm. Grossly normal heart sounds.  Good peripheral circulation. Respiratory: Normal respiratory effort.  No retractions. Lungs CTAB. Abdominal: Nonsurgical abdomen but there is tenderness to palpation of the left abdomen mostly left lower quadrant. No distention. No guarding no rebound Back:  There is no focal tenderness or step off there is no midline tenderness there are no lesions noted. there is no CVA tenderness Musculoskeletal: No lower extremity tenderness. No joint effusions, no DVT signs strong distal pulses no edema Neurologic:  Normal speech and language. No gross focal neurologic deficits are appreciated.  Skin:  Skin is  warm, dry and intact. No rash noted. Psychiatric: Mood and affect are normal. Speech and behavior are normal.  ____________________________________________   LABS (all labs ordered are listed, but only abnormal results are displayed)  Labs Reviewed  COMPREHENSIVE METABOLIC PANEL - Abnormal; Notable for the following:    Glucose, Bld 125 (*)    AST 46 (*)    ALT 81 (*)    Alkaline Phosphatase 134 (*)     All other components within normal limits  CBC  TYPE AND SCREEN  ABO/RH   ____________________________________________  EKG  I personally interpreted any EKGs ordered by me or triage  ____________________________________________  RADIOLOGY  I reviewed any imaging ordered by me or triage that were performed during my shift and, if possible, patient and/or family made aware of any abnormal findings. ____________________________________________   PROCEDURES  Procedure(s) performed: None  Critical Care performed: None  ____________________________________________   INITIAL IMPRESSION / ASSESSMENT AND PLAN / ED COURSE  Pertinent labs & imaging results that were available during my care of the patient were reviewed by me and considered in my medical decision making (see chart for details).  Patient with nausea vomiting diarrhea which is nonbloody diarrhea. Very, complicated past medical history including diverticulitis with abscess and colostomy in the past. We will of course obtain a CT scan. Blood work is reassuring thus far. Certainly could be EHEC or even viral however given her, complicated abdominal history we will obtain imaging and reassess. We'll give her pain medication and IV fluids and nausea medication as well.  ----------------------------------------- 1:25 PM on 05/19/2015 -----------------------------------------  CT scan is reassuring blood work is reassuring, however, patient did have an episode of bloody diarrhea which is quite unable to get to the bathroom for. I feel that if this is going to continue patient should be admitted. We will defer antibiotics based on our concerns about possible enteroinvasive Escherichia coli and I will discuss with the hospitalist. Patient has a nonsurgical abdomen is requesting pain medication for ongoing abdominal pain. ____________________________________________   FINAL CLINICAL IMPRESSION(S) / ED DIAGNOSES  Final  diagnoses:  None      This chart was dictated using voice recognition software.  Despite best efforts to proofread,  errors can occur which can change meaning.     Schuyler Amor, MD 05/19/15 [place], MD 05/19/15 1325

## 2015-05-20 LAB — HEMOGLOBIN: HEMOGLOBIN: 12.5 g/dL (ref 12.0–16.0)

## 2015-05-20 MED ORDER — POTASSIUM CHLORIDE IN NACL 20-0.9 MEQ/L-% IV SOLN
INTRAVENOUS | Status: DC
  Administered 2015-05-20 – 2015-05-21 (×3): via INTRAVENOUS
  Filled 2015-05-20 (×8): qty 1000

## 2015-05-20 MED ORDER — BENAZEPRIL HCL 5 MG PO TABS
5.0000 mg | ORAL_TABLET | Freq: Every day | ORAL | Status: DC
  Administered 2015-05-20 – 2015-05-22 (×2): 5 mg via ORAL
  Filled 2015-05-20 (×3): qty 1

## 2015-05-20 MED ORDER — SODIUM CHLORIDE 0.9 % IV BOLUS (SEPSIS)
500.0000 mL | Freq: Once | INTRAVENOUS | Status: AC
  Administered 2015-05-20: 500 mL via INTRAVENOUS

## 2015-05-20 NOTE — Plan of Care (Signed)
Problem: Bowel/Gastric: Goal: Will show no signs and symptoms of gastrointestinal bleeding Outcome: Progressing One episode of bright red blood. No other symptoms. Will monitor bowel movements for color and size

## 2015-05-20 NOTE — Consult Note (Signed)
Consultation  Referring Provider:  Dr Ether Griffins    Admit date: 05/19/15 Consult date 05/20/15        Reason for Consultation:     Rectal bleeding         HPI:   Brandy Haley is a 60 y.o. female with history of hypertension/complicated diverticulitis sp colostomy & takedown 2014, who was admitted yesterday with abdominal pain, NV and  bloody diarrhea, onset 05/18/15 in the afternoon after eating chicken tacos and wonton soup at [employer]. Had also been at the beach on Monday- Thursday the week prior.  States her symptoms started almost directly after eating at [employer]. Pain was in the suprapubic area of abdomen. Vomited foodstuffs, diarrhea was not bloody to begin with, but turned marroon later Sunday night - then she presented to the ED in the early morning hours with marroon diarrhea: states her stools have slowed- has had 4 small dark marroon pasty stools today, now has no pain unless her abdomen is touched, and NV has ceased. There has been no fever, constitutional symptoms, and she denies further GI complaints. States she is not taking any NSAIDs. Never has episode like this in past. Doesn't take any laxatives. Denies vascular issues/coagulation disorders. Stool studies negative, c-diff negative,cbc unremarkable Mild elevation to ast/alt/alp, gfr normal Ct with transverse colitis, post op changes in the rectosigmoid colon. Liver unremarkable- has had prior cholecystectomy with prominent CBD. Colitis was thougth to be inflammatory or infectious. Colonoscopy last in 10/2011 prior to her colostomy reversal- evidently there was only diverticulosis, but I cannot access full report, was done by Dr Leanora Cover. States there is no family history of IBD, colorectal cancer or colon polyps. States she has had some intermittent diarrhea spells since her colostomy reversal but has attributed this to her shortened colon.  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     Past Medical History  Diagnosis Date  .  Hypertension     Past Surgical History  Procedure Laterality Date  . Colostomy    . Appendectomy    . Cholecystectomy    . Colostomy reversal      No family history on file. Negative for colorectal cancer, colon polyps, IBD.  Social History  Substance Use Topics  . Smoking status: Never Smoker   . Smokeless tobacco: None  . Alcohol Use: No    Prior to Admission medications   Medication Sig Start Date End Date Taking? Authorizing Provider  aspirin 81 MG chewable tablet Chew 81 mg by mouth daily.   Yes Historical Provider, MD  benazepril (LOTENSIN) 10 MG tablet Take 5 mg by mouth daily.    Yes Historical Provider, MD  Calcium Carbonate-Vitamin D (CALCIUM 600+D) 600-400 MG-UNIT tablet Take 1 tablet by mouth daily.   Yes Historical Provider, MD    Current Facility-Administered Medications  Medication Dose Route Frequency Provider Last Rate Last Dose  . 0.9 % NaCl with KCl 20 mEq/ L  infusion   Intravenous Continuous Theodoro Grist, MD 125 mL/hr at 05/20/15 0950    . acetaminophen (TYLENOL) tablet 650 mg  650 mg Oral Q6H PRN Fritzi Mandes, MD       Or  . acetaminophen (TYLENOL) suppository 650 mg  650 mg Rectal Q6H PRN Fritzi Mandes, MD      . calcium-vitamin D (OSCAL WITH D) 500-200 MG-UNIT per tablet 1 tablet  1 tablet Oral Daily Fritzi Mandes, MD   1 tablet at 05/20/15 0950  . ciprofloxacin (CIPRO) IVPB 400 mg  400  mg Intravenous Q12H Lenis Noon, RPH   400 mg at 05/20/15 0530  . metroNIDAZOLE (FLAGYL) IVPB 500 mg  500 mg Intravenous Q12H Fritzi Mandes, MD 100 mL/hr at 05/20/15 0229 500 mg at 05/20/15 0229  . morphine 2 MG/ML injection 1 mg  1 mg Intravenous Q4H PRN Fritzi Mandes, MD   1 mg at 05/19/15 1910  . ondansetron (ZOFRAN) tablet 4 mg  4 mg Oral Q6H PRN Fritzi Mandes, MD       Or  . ondansetron (ZOFRAN) injection 4 mg  4 mg Intravenous Q6H PRN Fritzi Mandes, MD   4 mg at 05/19/15 1817  . oxyCODONE (Oxy IR/ROXICODONE) immediate release tablet 5 mg  5 mg Oral Q4H PRN Fritzi Mandes, MD      .  prochlorperazine (COMPAZINE) injection 10 mg  10 mg Intravenous Q8H PRN Fritzi Mandes, MD   10 mg at 05/19/15 1909    Allergies as of 05/19/2015  . (No Known Allergies)     Review of Systems:    All systems reviewed and negative except where noted in HPI.      Physical Exam:  Vital signs in last 24 hours: Temp:  [97.7 F (36.5 C)-98.6 F (37 C)] 98.1 F (36.7 C) (05/02 1227) Pulse Rate:  [63-84] 84 (05/02 1227) Resp:  [11-19] 18 (05/02 0406) BP: (95-169)/(56-95) 125/73 mmHg (05/02 1227) SpO2:  [96 %-99 %] 99 % (05/02 1227) Weight:  [58.65 kg (129 lb 4.8 oz)] 58.65 kg (129 lb 4.8 oz) (05/01 1704) Last BM Date: 05/20/15 General:   Pleasant woman in NAD Head:  Normocephalic and atraumatic. Eyes:   No icterus.   Conjunctiva pink. Ears:  Normal auditory acuity. Mouth: Mucosa pink moist, no lesions. Neck:  Supple; no masses felt Lungs:  Respirations even and unlabored. Lungs clear to auscultation bilaterally.   No wheezes, crackles, or rhonchi.  Heart:  S1S2, RRR, no MRG. No edema. Abdomen:   Flat, soft, nondistended, minimal periumbilic tenderness. Normal bowel sounds. No appreciable masses or hepatomegaly. No rebound signs or other peritoneal signs. Msk:  MAEW x4, No clubbing or cyanosis. Strength 5/5. Symmetrical without gross deformities. Neurologic:  Alert and  oriented x4;  Cranial nerves II-XII intact.  Skin:  Warm, dry, pink without significant lesions or rashes. Psych:  Alert and cooperative. Normal affect.  LAB RESULTS:  Recent Labs  05/19/15 0757  WBC 9.1  HGB 13.9  HCT 39.8  PLT 207   BMET  Recent Labs  05/19/15 0757  NA 139  K 3.8  CL 105  CO2 24  GLUCOSE 125*  BUN 17  CREATININE 0.74  CALCIUM 9.3   LFT  Recent Labs  05/19/15 0757  PROT 8.1  ALBUMIN 3.9  AST 46*  ALT 81*  ALKPHOS 134*  BILITOT 0.6   PT/INR No results for input(s): LABPROT, INR in the last 72 hours.  STUDIES: Ct Abdomen Pelvis W Contrast  05/19/2015  CLINICAL DATA:   Mid lower abdominal pain, rectal bleeding for 2 days. EXAM: CT ABDOMEN AND PELVIS WITH CONTRAST TECHNIQUE: Multidetector CT imaging of the abdomen and pelvis was performed using the standard protocol following bolus administration of intravenous contrast. CONTRAST:  90mL ISOVUE-300 IOPAMIDOL (ISOVUE-300) INJECTION 61% COMPARISON:  Plain films 04/22/2012.  CT 10/25/2011. FINDINGS: Lower chest: Linear dependent atelectasis in the lung bases. Heart is normal size. No effusions. Hepatobiliary: Prior cholecystectomy. Common bile duct is mildly prominent, likely related to post cholecystectomy state. No focal hepatic abnormality. Pancreas: No focal abnormality or  ductal dilatation. Spleen: No focal abnormality.  Normal size. Adrenals/Urinary Tract: No adrenal abnormality. No focal renal abnormality. No stones or hydronephrosis. Urinary bladder is unremarkable. Stomach/Bowel: Postoperative changes in the region of the rectosigmoid colon. There is wall thickening within the colon from the mid transverse colon to the distal descending colon compatible with colitis. No evidence of bowel obstruction. Stomach and small bowel grossly unremarkable. Vascular/Lymphatic: Scattered aortic calcifications.  No aneurysm. Reproductive: Uterus and adnexa unremarkable.  No mass. Other: No free fluid or free air. Musculoskeletal: No acute bony abnormality or focal bone lesion. IMPRESSION: Circumferential wall thickening involving the colon from the mid transverse colon to the distal descending colon compatible with colitis, likely inflammatory or infectious. Electronically Signed   By: Rolm Baptise M.D.   On: 05/19/2015 12:44       Impression / Plan:   Abdominal pain, NV/bloody diarrhea. Pain much better, no NV now. Bloody diarrhea improving, stool studies negative although this does sound infectious in source, however inflammatory is in ddx, as is ischemic. Agree with current CLIQ and antibiotics. Think she would benefit from  colonoscopy- will discuss with Dr Gustavo Lah as to timing- likely can do as outpatient.  Thank you very much for this consult. These services were provided by Stephens November, NP-C, in collaboration with Lollie Sails, MD, with whom I have discussed this patient in full.   Stephens November, NP-C

## 2015-05-20 NOTE — Progress Notes (Signed)
[facility] [place] NAME: Brandy Haley    MR#:  [id number]  DATE OF BIRTH:  10-10-1955  SUBJECTIVE:  CHIEF COMPLAINT:   Chief Complaint  Patient presents with  . Rectal Bleeding  . Abdominal Pain   patient is a 60 year old female with past medical history significant for history of perforated diverticulitis status post colostomy and takedown in 2013 through 2014, essential hypertension, who presents to the hospital with complaints of bloody diarrhea which started one day before admission. The patient complained of at least 15 episodes of bloody diarrhea along with nausea and vomiting, also cramping abdominal pain. In emergency room, patient had CT scan of abdomen and pelvis with 3 which revealed colitis in left upper quadrant. Patient was admitted off for IV Cipro and Flagyl. She had a few more episodes of bleeding overnight, but no significant diarrhea. Still complains of left-sided abdominal pain, cramping in nature, however, feels better today. Repeat hemoglobin level is pending. Patient is tolerating clear liquid diet. GI consultation is pending.   Review of Systems  Constitutional: Negative for fever, chills and weight loss.  HENT: Negative for congestion.   Eyes: Negative for blurred vision and double vision.  Respiratory: Negative for cough, sputum production, shortness of breath and wheezing.   Cardiovascular: Negative for chest pain, palpitations, orthopnea, leg swelling and PND.  Gastrointestinal: Positive for abdominal pain, diarrhea and blood in stool. Negative for nausea, vomiting and constipation.  Genitourinary: Negative for dysuria, urgency, frequency and hematuria.  Musculoskeletal: Negative for falls.  Neurological: Negative for dizziness, tremors, focal weakness and headaches.  Endo/Heme/Allergies: Does not bruise/bleed easily.  Psychiatric/Behavioral: Negative for depression. The patient does not have insomnia.      VITAL SIGNS: Blood pressure 125/73, pulse 84, temperature 98.1 F (36.7 C), temperature source Oral, resp. rate 18, height 5\' 2"  (1.575 m), weight 58.65 kg (129 lb 4.8 oz), SpO2 99 %.  PHYSICAL EXAMINATION:   GENERAL:  60 y.o.-year-old patient lying in the bed in mild distress due to mild abdominal pain .  EYES: Pupils equal, round, reactive to light and accommodation. No scleral icterus. Extraocular muscles intact.  HEENT: Head atraumatic, normocephalic. Oropharynx and nasopharynx clear.  NECK:  Supple, no jugular venous distention. No thyroid enlargement, no tenderness.  LUNGS: Normal breath sounds bilaterally, no wheezing, rales,rhonchi or crepitation. No use of accessory muscles of respiration.  CARDIOVASCULAR: S1, S2 normal. No murmurs, rubs, or gallops.  ABDOMEN: Soft, tender in left upper quadrant and left lower quadrant, but no rebound or guarding was noted, nondistended. Bowel sounds present. No organomegaly or mass.  EXTREMITIES: No pedal edema, cyanosis, or clubbing.  NEUROLOGIC: Cranial nerves II through XII are intact. Muscle strength 5/5 in all extremities. Sensation intact. Gait not checked.  PSYCHIATRIC: The patient is alert and oriented x 3.  SKIN: No obvious rash, lesion, or ulcer.   ORDERS/RESULTS REVIEWED:   CBC  Recent Labs Lab 05/19/15 0757  WBC 9.1  HGB 13.9  HCT 39.8  PLT 207  MCV 94.6  MCH 33.1  MCHC 34.9  RDW 12.6   ------------------------------------------------------------------------------------------------------------------  Chemistries   Recent Labs Lab 05/19/15 0757  NA 139  K 3.8  CL 105  CO2 24  GLUCOSE 125*  BUN 17  CREATININE 0.74  CALCIUM 9.3  AST 46*  ALT 81*  ALKPHOS 134*  BILITOT 0.6   ------------------------------------------------------------------------------------------------------------------ estimated creatinine clearance is 58.4 mL/min (by C-G formula based on Cr of  0.74). ------------------------------------------------------------------------------------------------------------------ No results  for input(s): TSH, T4TOTAL, T3FREE, THYROIDAB in the last 72 hours.  Invalid input(s): FREET3  Cardiac Enzymes No results for input(s): CKMB, TROPONINI, MYOGLOBIN in the last 168 hours.  Invalid input(s): CK ------------------------------------------------------------------------------------------------------------------ Invalid input(s): POCBNP ---------------------------------------------------------------------------------------------------------------  RADIOLOGY: Ct Abdomen Pelvis W Contrast  05/19/2015  CLINICAL DATA:  Mid lower abdominal pain, rectal bleeding for 2 days. EXAM: CT ABDOMEN AND PELVIS WITH CONTRAST TECHNIQUE: Multidetector CT imaging of the abdomen and pelvis was performed using the standard protocol following bolus administration of intravenous contrast. CONTRAST:  51mL ISOVUE-300 IOPAMIDOL (ISOVUE-300) INJECTION 61% COMPARISON:  Plain films 04/22/2012.  CT 10/25/2011. FINDINGS: Lower chest: Linear dependent atelectasis in the lung bases. Heart is normal size. No effusions. Hepatobiliary: Prior cholecystectomy. Common bile duct is mildly prominent, likely related to post cholecystectomy state. No focal hepatic abnormality. Pancreas: No focal abnormality or ductal dilatation. Spleen: No focal abnormality.  Normal size. Adrenals/Urinary Tract: No adrenal abnormality. No focal renal abnormality. No stones or hydronephrosis. Urinary bladder is unremarkable. Stomach/Bowel: Postoperative changes in the region of the rectosigmoid colon. There is wall thickening within the colon from the mid transverse colon to the distal descending colon compatible with colitis. No evidence of bowel obstruction. Stomach and small bowel grossly unremarkable. Vascular/Lymphatic: Scattered aortic calcifications.  No aneurysm. Reproductive: Uterus and adnexa unremarkable.  No  mass. Other: No free fluid or free air. Musculoskeletal: No acute bony abnormality or focal bone lesion. IMPRESSION: Circumferential wall thickening involving the colon from the mid transverse colon to the distal descending colon compatible with colitis, likely inflammatory or infectious. Electronically Signed   By: Rolm Baptise M.D.   On: 05/19/2015 12:44    EKG:  Orders placed or performed in visit on 04/11/12  . EKG 12-Lead    ASSESSMENT AND PLAN:  Active Problems:   Rectal bleed  #1. Acute likely ischemic colitis, continue IV fluids, Cipro and Flagyl intravenously, stool cultures are negative for bacterial infection, C. difficile testing was negative as well. Gastroenterology consultation is pending, likely colonoscopy as outpatient when colitis is healed, conservative therapy for now #2. Hypotension, advance IV fluids, hold blood pressure medications, suspect dehydration as patient reported diarrheal stool a week prior to bloody stools #3. Acute posthemorrhagic anemia, checking hemoglobin with rehydration, transfuse patient as needed, discussed risks as well as benefits, all questions were answered.  #4. Hyperglycemia, get a hemoglobin A1c to rule out diabetes  Management plans discussed with the patient, family and they are in agreement.   DRUG ALLERGIES: No Known Allergies  CODE STATUS:     Code Status Orders        Start     Ordered   05/19/15 1702  Full code   Continuous     05/19/15 1701    Code Status History    Date Active Date Inactive Code Status Order ID Comments User Context   This patient has a current code status but no historical code status.      TOTAL TIME TAKING CARE OF THIS PATIENT: 40 minutes.    Theodoro Grist M.D on 05/20/2015 at 2:17 PM  Between 7am to 6pm - Pager - [phone]  After 6pm go to [email or web address] - password [email or web address] [facility]  [facility]  Office  [phone]  CC: Primary care physician; No primary care provider on  file.

## 2015-05-20 NOTE — Consult Note (Signed)
Please see full GI consult by Ms Jacqulyn Liner.  Patient seen and examined, chart reviewed.  Patient admitted with acute onset diarrheal illness, with n/v diarrhea and bloody stool after eating at a [employer].  Last bm was about 11am this am (8 hr ago) and stools have progressively lessened in amount and in the bloody appearance.  Patient CT scan showing a transverse colitis.  Currently denies abdominal pain.. Of note, patient with h/o diverticulitis requiring emergent surgery and colostomy with reversal, however has never had a colonoscopy.    Recommend continuing current abx treatment to 7-10 day course.  No plans for luminal evaluation  At present, however recommending a colonoscopy in about 3-4 weeks as o/p.  Will need GI o/p fu in 2 weeks to arrange.   Advance diet slowly, from clears to cull liquids to low residue/soft for a week before going back to regular diet.    Will follow in the hospital.

## 2015-05-21 LAB — HEMOGLOBIN: Hemoglobin: 11.8 g/dL — ABNORMAL LOW (ref 12.0–16.0)

## 2015-05-21 LAB — HEMOGLOBIN A1C: HEMOGLOBIN A1C: 5.6 % (ref 4.0–6.0)

## 2015-05-21 MED ORDER — BOOST / RESOURCE BREEZE PO LIQD
1.0000 | Freq: Three times a day (TID) | ORAL | Status: DC
  Administered 2015-05-21 – 2015-05-22 (×4): 1 via ORAL

## 2015-05-21 MED ORDER — OXYCODONE HCL 5 MG PO TABS: 5.0000 mg | ORAL_TABLET | ORAL | Status: DC | PRN

## 2015-05-21 NOTE — Consult Note (Signed)
Subjective: Patient seen for abdominal pain and bloody diarrhea.  Patient main complaint is back pain.  Denies abdominalpain or nausea.  One bm last night with old looking bloody material, none today.   Objective: Vital signs in last 24 hours: Temp:  [98.7 F (37.1 C)-98.8 F (37.1 C)] 98.8 F (37.1 C) (05/03 1249) Pulse Rate:  [79-83] 83 (05/03 1249) Resp:  [16-20] 16 (05/03 1249) BP: (116-175)/(66-92) 135/71 mmHg (05/03 1249) SpO2:  [97 %-99 %] 99 % (05/03 1249) Blood pressure 135/71, pulse 83, temperature 98.8 F (37.1 C), temperature source Oral, resp. rate 16, height 5\' 2"  (1.575 m), weight 58.65 kg (129 lb 4.8 oz), SpO2 99 %.   Intake/Output from previous day: 05/02 0701 - 05/03 0700 In: 3686.7 [P.O.:1260; I.V.:1826.7; IV Piggyback:600] Out: 100 [Urine:100]  Intake/Output this shift: Total I/O In: 954.2 [P.O.:750; I.V.:204.2] Out: -    General appearance:  Well appearing 59 f no distress.  Resp:  bcta Cardio:  rrr without rub or gallop GI:  Soft nontender nondistended bowel sounds positive.  Extremities:  No cce.    Lab Results: Results for orders placed or performed during the hospital encounter of 05/19/15 (from the past 24 hour(s))  Hemoglobin     Status: Abnormal   Collection Time: 05/21/15  9:20 AM  Result Value Ref Range   Hemoglobin 11.8 (L) 12.0 - 16.0 g/dL      Recent Labs  05/19/15 0757 05/20/15 1447 05/21/15 0920  WBC 9.1  --   --   HGB 13.9 12.5 11.8*  HCT 39.8  --   --   PLT 207  --   --    BMET  Recent Labs  05/19/15 0757  NA 139  K 3.8  CL 105  CO2 24  GLUCOSE 125*  BUN 17  CREATININE 0.74  CALCIUM 9.3   LFT  Recent Labs  05/19/15 0757  PROT 8.1  ALBUMIN 3.9  AST 46*  ALT 81*  ALKPHOS 134*  BILITOT 0.6   PT/INR No results for input(s): LABPROT, INR in the last 72 hours. Hepatitis Panel No results for input(s): HEPBSAG, HCVAB, HEPAIGM, HEPBIGM in the last 72 hours. C-Diff  Recent Labs  05/19/15 1746  CDIFFTOX  NEGATIVE   No results for input(s): CDIFFPCR in the last 72 hours.   Studies/Results: No results found.  Scheduled Inpatient Medications:   . benazepril  5 mg Oral Daily  . calcium-vitamin D  1 tablet Oral Daily  . ciprofloxacin  400 mg Intravenous Q12H  . feeding supplement  1 Container Oral TID BM  . metronidazole  500 mg Intravenous Q12H    Continuous Inpatient Infusions:   . 0.9 % NaCl with KCl 20 mEq / L 75 mL/hr at 05/21/15 0934    PRN Inpatient Medications:  acetaminophen **OR** acetaminophen, morphine injection, ondansetron **OR** ondansetron (ZOFRAN) IV, oxyCODONE, prochlorperazine  Miscellaneous:   Assessment:  1)  Acute diarrheal illness with abdominal pain and bloody stool-much improved.  Likely an infective etiology rather than ischemia.    Plan:  1) continue current abx to 7-10 day course.  O/P Gi fu in about 2 weeks, will need o/p colonoscopy in 4 weeks or so. Advance dist slowly-will change to full liquids , tomorrow soft/low residue for 3-5 days.   Lollie Sails MD 05/21/2015, 3:49 PM

## 2015-05-21 NOTE — Progress Notes (Signed)
[facility] NAME: Brandy Haley    MR#:  [id number]  DATE OF BIRTH:  [birth year]  SUBJECTIVE:  CHIEF COMPLAINT:   Chief Complaint  Patient presents with  . Rectal Bleeding  . Abdominal Pain   patient is a 60 year old female with past medical history significant for history of perforated diverticulitis status post colostomy and takedown in 2013 through 2014, essential hypertension, who presents to the hospital with complaints of bloody diarrhea which started one day before admission. The patient complained of at least 15 episodes of bloody diarrhea along with nausea and vomiting, also cramping abdominal pain. In emergency room, patient had CT scan of abdomen and pelvis which revealed colitis in left upper quadrant. Patient was admitted off for IV Cipro and Flagyl. She had some more  bleeding overnight, but no significant diarrhea, Bleeding has been subsiding. Still complains of left-sided abdominal pain, cramping in nature. Hemoglobin is stable.   Review of Systems  Constitutional: Negative for fever, chills and weight loss.  HENT: Negative for congestion.   Eyes: Negative for blurred vision and double vision.  Respiratory: Negative for cough, sputum production, shortness of breath and wheezing.   Cardiovascular: Negative for chest pain, palpitations, orthopnea, leg swelling and PND.  Gastrointestinal: Positive for abdominal pain, diarrhea and blood in stool. Negative for nausea, vomiting and constipation.  Genitourinary: Negative for dysuria, urgency, frequency and hematuria.  Musculoskeletal: Negative for falls.  Neurological: Negative for dizziness, tremors, focal weakness and headaches.  Endo/Heme/Allergies: Does not bruise/bleed easily.  Psychiatric/Behavioral: Negative for depression. The patient does not have insomnia.     VITAL SIGNS: Blood pressure 135/71, pulse 83, temperature 98.8 F (37.1 C), temperature source Oral,  resp. rate 16, height 5\' 2"  (1.575 m), weight 58.65 kg (129 lb 4.8 oz), SpO2 99 %.  PHYSICAL EXAMINATION:   GENERAL:  60 y.o.-year-old patient lying in the bed in mild distress due to mild abdominal pain .  EYES: Pupils equal, round, reactive to light and accommodation. No scleral icterus. Extraocular muscles intact.  HEENT: Head atraumatic, normocephalic. Oropharynx and nasopharynx clear.  NECK:  Supple, no jugular venous distention. No thyroid enlargement, no tenderness.  LUNGS: Normal breath sounds bilaterally, no wheezing, rales,rhonchi or crepitation. No use of accessory muscles of respiration.  CARDIOVASCULAR: S1, S2 normal. No murmurs, rubs, or gallops.  ABDOMEN: Soft, tender in left upper quadrant and left lower quadrant, but no rebound or guarding was noted, nondistended. Bowel sounds present. No organomegaly or mass.  EXTREMITIES: No pedal edema, cyanosis, or clubbing.  NEUROLOGIC: Cranial nerves II through XII are intact. Muscle strength 5/5 in all extremities. Sensation intact. Gait not checked.  PSYCHIATRIC: The patient is alert and oriented x 3.  SKIN: No obvious rash, lesion, or ulcer.   ORDERS/RESULTS REVIEWED:   CBC  Recent Labs Lab 05/19/15 0757 05/20/15 1447 05/21/15 0920  WBC 9.1  --   --   HGB 13.9 12.5 11.8*  HCT 39.8  --   --   PLT 207  --   --   MCV 94.6  --   --   MCH 33.1  --   --   MCHC 34.9  --   --   RDW 12.6  --   --    ------------------------------------------------------------------------------------------------------------------  Chemistries   Recent Labs Lab 05/19/15 0757  NA 139  K 3.8  CL 105  CO2 24  GLUCOSE 125*  BUN 17  CREATININE 0.74  CALCIUM 9.3  AST 46*  ALT 81*  ALKPHOS 134*  BILITOT 0.6   ------------------------------------------------------------------------------------------------------------------ estimated creatinine clearance is 58.4 mL/min (by C-G formula based on Cr of  0.74). ------------------------------------------------------------------------------------------------------------------ No results for input(s): TSH, T4TOTAL, T3FREE, THYROIDAB in the last 72 hours.  Invalid input(s): FREET3  Cardiac Enzymes No results for input(s): CKMB, TROPONINI, MYOGLOBIN in the last 168 hours.  Invalid input(s): CK ------------------------------------------------------------------------------------------------------------------ Invalid input(s): POCBNP ---------------------------------------------------------------------------------------------------------------  RADIOLOGY: No results found.  EKG:  Orders placed or performed in visit on 04/11/12  . EKG 12-Lead    ASSESSMENT AND PLAN:  Active Problems:   Rectal bleed  #1. Acute likely ischemic colitis, continue IV fluids, Cipro and Flagyl intravenously, stool cultures are negative for bacterial infection, C. difficile testing was negative as well. Gastroenterology consultation is Appreciated, colonoscopy as outpatient in 4-6 weeks when colitis is healed, conservative therapy for now, advance diet to full liquids, follow clinically #2. Hypotension, resolved on advanced IV fluids, holding blood pressure medications, suspect element of dehydration as patient reported diarrheal stool a week prior to bloody stools #3. Acute posthemorrhagic anemia, hemoglobin has slightly decreased with rehydration, but no need to transfuse   #4. Hyperglycemia, hemoglobin A1c was 5.6, no  diabetes  Management plans discussed with the patient, family and they are in agreement.   DRUG ALLERGIES: No Known Allergies  CODE STATUS:     Code Status Orders        Start     Ordered   05/19/15 1702  Full code   Continuous     05/19/15 1701    Code Status History    Date Active Date Inactive Code Status Order ID Comments User Context   This patient has a current code status but no historical code status.      TOTAL TIME  TAKING CARE OF THIS PATIENT: 35 minutes.    Theodoro Grist M.D on 05/21/2015 at 2:47 PM  Between 7am to 6pm - Pager - [phone]  After 6pm go to [email or web address] - password [email or web address] [facility]  [facility]  Office  [phone]  CC: Primary care physician; No primary care provider on file.

## 2015-05-21 NOTE — Care Management Important Message (Signed)
Important Message  Patient Details  Name: Brandy Haley MRN: [id number] Date of Birth: September 24, 1955   Medicare Important Message Given:  Yes    [name] 05/21/2015, 2:42 PM

## 2015-05-22 DIAGNOSIS — R739 Hyperglycemia, unspecified: Secondary | ICD-10-CM

## 2015-05-22 DIAGNOSIS — K529 Noninfective gastroenteritis and colitis, unspecified: Secondary | ICD-10-CM

## 2015-05-22 DIAGNOSIS — I959 Hypotension, unspecified: Secondary | ICD-10-CM

## 2015-05-22 DIAGNOSIS — R197 Diarrhea, unspecified: Secondary | ICD-10-CM

## 2015-05-22 DIAGNOSIS — D62 Acute posthemorrhagic anemia: Secondary | ICD-10-CM

## 2015-05-22 LAB — BASIC METABOLIC PANEL
ANION GAP: 5 (ref 5–15)
BUN: 6 mg/dL (ref 6–20)
CALCIUM: 8.2 mg/dL — AB (ref 8.9–10.3)
CO2: 25 mmol/L (ref 22–32)
Chloride: 108 mmol/L (ref 101–111)
Creatinine, Ser: 0.72 mg/dL (ref 0.44–1.00)
GFR calc Af Amer: >60 mL/min (ref >60)
GLUCOSE: 99 mg/dL (ref 65–99)
Potassium: 3.9 mmol/L (ref 3.5–5.1)
Sodium: 138 mmol/L (ref 135–145)

## 2015-05-22 LAB — HEMOGLOBIN: Hemoglobin: 12.3 g/dL (ref 12.0–16.0)

## 2015-05-22 MED ORDER — DIPHENHYDRAMINE HCL 25 MG PO CAPS: 25.0000 mg | ORAL_CAPSULE | Freq: Four times a day (QID) | ORAL | Status: DC | PRN

## 2015-05-22 MED ORDER — BOOST / RESOURCE BREEZE PO LIQD: 1.0000 | Freq: Three times a day (TID) | ORAL | Status: DC

## 2015-05-22 MED ORDER — METHYLPREDNISOLONE SODIUM SUCC 125 MG IJ SOLR
60.0000 mg | Freq: Once | INTRAMUSCULAR | Status: AC
  Administered 2015-05-22: 60 mg via INTRAVENOUS
  Filled 2015-05-22: qty 2

## 2015-05-22 MED ORDER — AMOXICILLIN-POT CLAVULANATE 875-125 MG PO TABS
1.0000 | ORAL_TABLET | Freq: Two times a day (BID) | ORAL | Status: DC
  Administered 2015-05-22: 1 via ORAL
  Filled 2015-05-22: qty 1

## 2015-05-22 MED ORDER — PROCHLORPERAZINE MALEATE 5 MG PO TABS: 5.0000 mg | ORAL_TABLET | Freq: Four times a day (QID) | ORAL | Status: DC | PRN

## 2015-05-22 MED ORDER — AMOXICILLIN-POT CLAVULANATE 875-125 MG PO TABS: 1.0000 | ORAL_TABLET | Freq: Two times a day (BID) | ORAL | Status: DC

## 2015-05-22 MED ORDER — DIPHENHYDRAMINE HCL 25 MG PO CAPS
25.0000 mg | ORAL_CAPSULE | Freq: Four times a day (QID) | ORAL | Status: DC | PRN
  Administered 2015-05-22: 25 mg via ORAL
  Filled 2015-05-22: qty 1

## 2015-05-22 MED ORDER — PANTOPRAZOLE SODIUM 40 MG PO TBEC: 40.0000 mg | DELAYED_RELEASE_TABLET | Freq: Every day | ORAL | Status: DC

## 2015-05-22 NOTE — Discharge Summary (Signed)
[facility] [place] NAME: Brandy Haley    MR#:  [id number]  DATE OF BIRTH:  01-18-1956  DATE OF ADMISSION:  05/19/2015 ADMITTING PHYSICIAN: Fritzi Mandes, MD  DATE OF DISCHARGE: 05/22/2015  2:58 PM  PRIMARY CARE PHYSICIAN: No primary care provider on file.     ADMISSION DIAGNOSIS:  Bloody diarrhea [A09]  DISCHARGE DIAGNOSIS:  Principal Problem:   Rectal bleed Active Problems:   Acute colitis   Diarrhea   Hypotension   Acute posthemorrhagic anemia   Hyperglycemia   SECONDARY DIAGNOSIS:   Past Medical History  Diagnosis Date  . Hypertension     .pro HOSPITAL COURSE:  The patient is a 60 year old female with past medical history significant for history of perforated diverticulitis status post colostomy and takedown in 2013 through 2014, essential hypertension, who presented to the hospital with complaints of bloody diarrhea which started one day before admission. The patient complained of at least 15 episodes of bloody diarrhea along with nausea and vomiting, also cramping abdominal pain. In the emergency room, patient had CT scan of abdomen and pelvis which revealed colitis in left upper quadrant. Patient was admitted on IV Cipro and Flagyl, IV fluids, pain medications. Stool cultures were taken, however, remained negative including C. difficile. Patient was seen by gastroenterologist. Will follow patient along. Patient was felt to have possible infectious diarrhea by gastroenterologist, less likely ischemic colitis. With conservative therapy. However, patient clinically improved, bleeding subsided. Hemoglobin remained stable and did not require transfusion. While in the hospital patient was noted to be hypotensive, requiring stopping blood pressure medications. With hydration, patient's blood pressure improved. Patient developed rash in lower back, itching sensation, especially whenever she was infused with ciprofloxacin, necessitating  discontinuation of ciprofloxacin and Flagyl and initiation of Augmentin orally. Patient was given 1 dose of Solu-Medrol, she was advised to continue Benadryl as needed for itching. Patient was felt to be stable to be discharged home today on 05/22/2015. Discussion by problem #1. Acute colitis, infectious versus ischemic, patient is to continue Augmentin for 7 days to complete course,   Cipro was discontinued due to allergic reaction/drug eruption , stool cultures where negative  for bacterial infection, C. difficile testing was negative as well.Gastroenterologist saw patient in consultation and follow patient along, patient is to follow up with gastroenterologist within 1-2 weeks after discharge to make plans about colonoscopy as outpatient. Patient's diet was advanced to soft low residue diet prior to discharge from the hospital and patient had no recurrent bleeding or pain .   #2. Hypotension, resolved on advanced IV fluids, holding blood pressure medications, suspected element of dehydration as patient reported diarrheal stool a week prior to bloody stools, patient is to follow-up with primary care physician and resume blood pressure medications. Her blood pressure is stable and she has no recurrent diarrheal stools/dehydration  #3. Acute posthemorrhagic anemia, hemoglobin remained stable with rehydration, . There was no need to transfuse. Hemoglobin level on the day of discharge was 12.3  #4. Hyperglycemia, hemoglobin A1c was 5.6, no diabetes  #5. Rash, likely drug eruption due to ciprofloxacin, patient was given 1 dose of Solu-Medrol, patient was advised to continue Benadryl as needed for itching  DISCHARGE CONDITIONS:   Stable   CONSULTS OBTAINED:  Treatment Team:  Lollie Sails, MD  DRUG ALLERGIES:  No Known Allergies  DISCHARGE MEDICATIONS:   Discharge Medication List as of 05/22/2015  1:53 PM    START taking these medications  Details  amoxicillin-clavulanate (AUGMENTIN)  875-125 MG tablet Take 1 tablet by mouth every 12 (twelve) hours., Starting 05/22/2015, Until Discontinued, Normal    diphenhydrAMINE (BENADRYL) 25 mg capsule Take 1 capsule (25 mg total) by mouth every 6 (six) hours as needed for itching., Starting 05/22/2015, Until Discontinued, Normal    feeding supplement (BOOST / RESOURCE BREEZE) LIQD Take 1 Container by mouth 3 (three) times daily between meals., Starting 05/22/2015, Until Discontinued, Normal    pantoprazole (PROTONIX) 40 MG tablet Take 1 tablet (40 mg total) by mouth daily., Starting 05/22/2015, Until Discontinued, Normal    prochlorperazine (COMPAZINE) 5 MG tablet Take 1 tablet (5 mg total) by mouth every 6 (six) hours as needed for nausea or vomiting., Starting 05/22/2015, Until Discontinued, Normal      CONTINUE these medications which have NOT CHANGED   Details  Calcium Carbonate-Vitamin D (CALCIUM 600+D) 600-400 MG-UNIT tablet Take 1 tablet by mouth daily., Until Discontinued, Historical Med      STOP taking these medications     aspirin 81 MG chewable tablet      benazepril (LOTENSIN) 10 MG tablet          DISCHARGE INSTRUCTIONS:    Patient is to follow-up with primary care physician, gastroenterologist as outpatient   If you experience worsening of your admission symptoms, develop shortness of breath, life threatening emergency, suicidal or homicidal thoughts you must seek medical attention immediately by calling 911 or calling your MD immediately  if symptoms less severe.  You Must read complete instructions/literature along with all the possible adverse reactions/side effects for all the Medicines you take and that have been prescribed to you. Take any new Medicines after you have completely understood and accept all the possible adverse reactions/side effects.   Please note  You were cared for by a hospitalist during your hospital stay. If you have any questions about your discharge medications or the care you received  while you were in the hospital after you are discharged, you can call the unit and asked to speak with the hospitalist on call if the hospitalist that took care of you is not available. Once you are discharged, your primary care physician will handle any further medical issues. Please note that NO REFILLS for any discharge medications will be authorized once you are discharged, as it is imperative that you return to your primary care physician (or establish a relationship with a primary care physician if you do not have one) for your aftercare needs so that they can reassess your need for medications and monitor your lab values.    Today   CHIEF COMPLAINT:   Chief Complaint  Patient presents with  . Rectal Bleeding  . Abdominal Pain    HISTORY OF PRESENT ILLNESS:  Brandy Haley  is a 60 y.o. female with a known history of perforated diverticulitis status post colostomy and takedown in 2013 through 2014, essential hypertension, who presented to the hospital with complaints of bloody diarrhea which started one day before admission. The patient complained of at least 15 episodes of bloody diarrhea along with nausea and vomiting, also cramping abdominal pain. In the emergency room, patient had CT scan of abdomen and pelvis which revealed colitis in left upper quadrant. Patient was admitted on IV Cipro and Flagyl, IV fluids, pain medications. Stool cultures were taken, however, remained negative including C. difficile. Patient was seen by gastroenterologist. Will follow patient along. Patient was felt to have possible infectious diarrhea by gastroenterologist, less likely ischemic colitis.  With conservative therapy. However, patient clinically improved, bleeding subsided. Hemoglobin remained stable and did not require transfusion. While in the hospital patient was noted to be hypotensive, requiring stopping blood pressure medications. With hydration, patient's blood pressure improved. Patient developed rash  in lower back, itching sensation, especially whenever she was infused with ciprofloxacin, necessitating discontinuation of ciprofloxacin and Flagyl and initiation of Augmentin orally. Patient was given 1 dose of Solu-Medrol, she was advised to continue Benadryl as needed for itching. Patient was felt to be stable to be discharged home today on 05/22/2015. Discussion by problem #1. Acute colitis, infectious versus ischemic, patient is to continue Augmentin for 7 days to complete course,   Cipro was discontinued due to allergic reaction/drug eruption , stool cultures where negative  for bacterial infection, C. difficile testing was negative as well.Gastroenterologist saw patient in consultation and follow patient along, patient is to follow up with gastroenterologist within 1-2 weeks after discharge to make plans about colonoscopy as outpatient. Patient's diet was advanced to soft low residue diet prior to discharge from the hospital and patient had no recurrent bleeding or pain .   #2. Hypotension, resolved on advanced IV fluids, holding blood pressure medications, suspected element of dehydration as patient reported diarrheal stool a week prior to bloody stools, patient is to follow-up with primary care physician and resume blood pressure medications. Her blood pressure is stable and she has no recurrent diarrheal stools/dehydration  #3. Acute posthemorrhagic anemia, hemoglobin remained stable with rehydration, . There was no need to transfuse. Hemoglobin level on the day of discharge was 12.3  #4. Hyperglycemia, hemoglobin A1c was 5.6, no diabetes  #5. Rash, likely drug eruption due to ciprofloxacin, patient was given 1 dose of Solu-Medrol, patient was advised to continue Benadryl as needed for itching     VITAL SIGNS:  Blood pressure 120/70, pulse 81, temperature 98.5 F (36.9 C), temperature source Oral, resp. rate 16, height 5\' 2"  (1.575 m), weight 58.65 kg (129 lb 4.8 oz), SpO2 96 %.  I/O:    Intake/Output Summary (Last 24 hours) at 05/22/15 1609 Last data filed at 05/22/15 0900  Gross per 24 hour  Intake 1476.67 ml  Output      0 ml  Net 1476.67 ml    PHYSICAL EXAMINATION:  GENERAL:  60 y.o.-year-old patient lying in the bed with no acute distress.  EYES: Pupils equal, round, reactive to light and accommodation. No scleral icterus. Extraocular muscles intact.  HEENT: Head atraumatic, normocephalic. Oropharynx and nasopharynx clear.  NECK:  Supple, no jugular venous distention. No thyroid enlargement, no tenderness.  LUNGS: Normal breath sounds bilaterally, no wheezing, rales,rhonchi or crepitation. No use of accessory muscles of respiration.  CARDIOVASCULAR: S1, S2 normal. No murmurs, rubs, or gallops.  ABDOMEN: Soft, non-tender, non-distended. Bowel sounds present. No organomegaly or mass.  EXTREMITIES: No pedal edema, cyanosis, or clubbing.  NEUROLOGIC: Cranial nerves II through XII are intact. Muscle strength 5/5 in all extremities. Sensation intact. Gait not checked.  PSYCHIATRIC: The patient is alert and oriented x 3.  SKIN: No obvious rash, lesion, or ulcer.   DATA REVIEW:   CBC  Recent Labs Lab 05/19/15 0757  05/22/15 0433  WBC 9.1  --   --   HGB 13.9  < > 12.3  HCT 39.8  --   --   PLT 207  --   --   < > = values in this interval not displayed.  [place] 05/19/15 0757 05/22/15 [phone]  NA 139 138  K 3.8 3.9  CL 105 108  CO2 24 25  GLUCOSE 125* 99  BUN 17 6  CREATININE 0.74 0.72  CALCIUM 9.3 8.2*  AST 46*  --   ALT 81*  --   ALKPHOS 134*  --   BILITOT 0.6  --     Cardiac Enzymes No results for input(s): TROPONINI in the last 168 hours.  Microbiology Results  Results for orders placed or performed during the hospital encounter of 05/19/15  C difficile quick scan w PCR reflex     Status: None   Collection Time: 05/19/15  5:46 PM  Result Value Ref Range Status   C Diff antigen NEGATIVE NEGATIVE Final   C Diff toxin  NEGATIVE NEGATIVE Final   C Diff interpretation Negative for C. difficile  Final  Gastrointestinal Panel by PCR , Stool     Status: None   Collection Time: 05/19/15  5:46 PM  Result Value Ref Range Status   Campylobacter species NOT DETECTED NOT DETECTED Final   Plesimonas shigelloides NOT DETECTED NOT DETECTED Final   Salmonella species NOT DETECTED NOT DETECTED Final   Yersinia enterocolitica NOT DETECTED NOT DETECTED Final   Vibrio species NOT DETECTED NOT DETECTED Final   Vibrio cholerae NOT DETECTED NOT DETECTED Final   Enteroaggregative E coli (EAEC) NOT DETECTED NOT DETECTED Final   Enteropathogenic E coli (EPEC) NOT DETECTED NOT DETECTED Final   Enterotoxigenic E coli (ETEC) NOT DETECTED NOT DETECTED Final   Shiga like toxin producing E coli (STEC) NOT DETECTED NOT DETECTED Final   E. coli O157 NOT DETECTED NOT DETECTED Final   Shigella/Enteroinvasive E coli (EIEC) NOT DETECTED NOT DETECTED Final   Cryptosporidium NOT DETECTED NOT DETECTED Final   Cyclospora cayetanensis NOT DETECTED NOT DETECTED Final   Entamoeba histolytica NOT DETECTED NOT DETECTED Final   Giardia lamblia NOT DETECTED NOT DETECTED Final   Adenovirus F40/41 NOT DETECTED NOT DETECTED Final   Astrovirus NOT DETECTED NOT DETECTED Final   Norovirus GI/GII NOT DETECTED NOT DETECTED Final   Rotavirus A NOT DETECTED NOT DETECTED Final   Sapovirus (I, II, IV, and V) NOT DETECTED NOT DETECTED Final    RADIOLOGY:  No results found.  EKG:   Orders placed or performed in visit on 04/11/12  . EKG 12-Lead      Management plans discussed with the patient, family and they are in agreement.  CODE STATUS:     Code Status Orders        Start     Ordered   05/19/15 1702  Full code   Continuous     05/19/15 1701    Code Status History    Date Active Date Inactive Code Status Order ID Comments User Context   This patient has a current code status but no historical code status.      TOTAL TIME TAKING CARE  OF THIS PATIENT: 40 minutes.    Theodoro Grist M.D on 05/22/2015 at 4:09 PM  Between 7am to 6pm - Pager - [phone]  After 6pm go to [email or web address] - password [email or web address] [facility]  [place] [facility]  Office  [phone]  CC: Primary care physician; No primary care provider on file.

## 2015-05-22 NOTE — Progress Notes (Signed)
05/22/2015 2:54 PM  Brandy Haley to be D/C'd Home per MD order.  Discussed prescriptions and follow up appointments with the patient. Prescriptions given to patient, medication list explained in detail. Pt verbalized understanding.    Medication List    STOP taking these medications        aspirin 81 MG chewable tablet     benazepril 10 MG tablet  Commonly known as:  LOTENSIN      TAKE these medications        amoxicillin-clavulanate 875-125 MG tablet  Commonly known as:  AUGMENTIN  Take 1 tablet by mouth every 12 (twelve) hours.     CALCIUM 600+D 600-400 MG-UNIT tablet  Generic drug:  Calcium Carbonate-Vitamin D  Take 1 tablet by mouth daily.     diphenhydrAMINE 25 mg capsule  Commonly known as:  BENADRYL  Take 1 capsule (25 mg total) by mouth every 6 (six) hours as needed for itching.     feeding supplement Liqd  Take 1 Container by mouth 3 (three) times daily between meals.     pantoprazole 40 MG tablet  Commonly known as:  PROTONIX  Take 1 tablet (40 mg total) by mouth daily.     prochlorperazine 5 MG tablet  Commonly known as:  COMPAZINE  Take 1 tablet (5 mg total) by mouth every 6 (six) hours as needed for nausea or vomiting.        Filed Vitals:   05/21/15 2039 05/22/15 0412  BP: 137/76 120/70  Pulse: 83 81  Temp: 98.7 F (37.1 C) 98.5 F (36.9 C)  Resp: 16 16    Skin clean, dry and intact without evidence of skin break down, no evidence of skin tears noted. IV catheter discontinued intact. Site without signs and symptoms of complications. Dressing and pressure applied. Pt denies pain at this time. No complaints noted.  An After Visit Summary was printed and given to the patient. Patient escorted via [place], and D/C home via private auto.  Dola Argyle

## 2015-05-22 NOTE — Progress Notes (Signed)
Pt noted she had a watery sligtly bloody BM early in the shift

## 2015-06-10 DIAGNOSIS — R197 Diarrhea, unspecified: Secondary | ICD-10-CM | POA: Diagnosis not present

## 2015-06-10 DIAGNOSIS — R935 Abnormal findings on diagnostic imaging of other abdominal regions, including retroperitoneum: Secondary | ICD-10-CM | POA: Diagnosis not present

## 2015-06-10 DIAGNOSIS — K219 Gastro-esophageal reflux disease without esophagitis: Secondary | ICD-10-CM | POA: Diagnosis not present

## 2015-06-10 DIAGNOSIS — R1310 Dysphagia, unspecified: Secondary | ICD-10-CM | POA: Diagnosis not present

## 2015-06-10 DIAGNOSIS — R7989 Other specified abnormal findings of blood chemistry: Secondary | ICD-10-CM | POA: Diagnosis not present

## 2015-06-12 ENCOUNTER — Other Ambulatory Visit: Payer: Self-pay | Admitting: Gastroenterology

## 2015-06-12 DIAGNOSIS — R1310 Dysphagia, unspecified: Secondary | ICD-10-CM

## 2015-06-18 ENCOUNTER — Ambulatory Visit
Admission: RE | Admit: 2015-06-18 | Discharge: 2015-06-18 | Disposition: A | Discharge status: Home / Self Care | Payer: Medicare Other | Source: Ambulatory Visit | Attending: Gastroenterology | Admitting: Gastroenterology

## 2015-06-18 DIAGNOSIS — K228 Other specified diseases of esophagus: Secondary | ICD-10-CM | POA: Insufficient documentation

## 2015-06-18 DIAGNOSIS — K222 Esophageal obstruction: Secondary | ICD-10-CM | POA: Diagnosis not present

## 2015-06-18 DIAGNOSIS — R1310 Dysphagia, unspecified: Secondary | ICD-10-CM

## 2015-09-08 ENCOUNTER — Encounter: Payer: Self-pay | Admitting: *Deleted

## 2015-09-09 ENCOUNTER — Ambulatory Visit: Payer: Medicare Other | Admitting: Anesthesiology

## 2015-09-09 ENCOUNTER — Encounter
Admission: RE | Disposition: A | Discharge status: Home / Self Care | Payer: Self-pay | Source: Ambulatory Visit | Attending: Gastroenterology

## 2015-09-09 ENCOUNTER — Encounter: Payer: Self-pay | Admitting: *Deleted

## 2015-09-09 ENCOUNTER — Ambulatory Visit
Admission: RE | Admit: 2015-09-09 | Discharge: 2015-09-09 | Disposition: A | Discharge status: Home / Self Care | Payer: Medicare Other | Source: Ambulatory Visit | Attending: Gastroenterology | Admitting: Gastroenterology

## 2015-09-09 DIAGNOSIS — K319 Disease of stomach and duodenum, unspecified: Secondary | ICD-10-CM | POA: Insufficient documentation

## 2015-09-09 DIAGNOSIS — B3781 Candidal esophagitis: Secondary | ICD-10-CM | POA: Diagnosis not present

## 2015-09-09 DIAGNOSIS — Z79899 Other long term (current) drug therapy: Secondary | ICD-10-CM | POA: Insufficient documentation

## 2015-09-09 DIAGNOSIS — K2960 Other gastritis without bleeding: Secondary | ICD-10-CM | POA: Insufficient documentation

## 2015-09-09 DIAGNOSIS — K2900 Acute gastritis without bleeding: Secondary | ICD-10-CM | POA: Diagnosis not present

## 2015-09-09 DIAGNOSIS — Z888 Allergy status to other drugs, medicaments and biological substances status: Secondary | ICD-10-CM | POA: Insufficient documentation

## 2015-09-09 DIAGNOSIS — D122 Benign neoplasm of ascending colon: Secondary | ICD-10-CM | POA: Diagnosis not present

## 2015-09-09 DIAGNOSIS — K224 Dyskinesia of esophagus: Secondary | ICD-10-CM | POA: Insufficient documentation

## 2015-09-09 DIAGNOSIS — I10 Essential (primary) hypertension: Secondary | ICD-10-CM | POA: Insufficient documentation

## 2015-09-09 DIAGNOSIS — K635 Polyp of colon: Secondary | ICD-10-CM | POA: Diagnosis not present

## 2015-09-09 DIAGNOSIS — K210 Gastro-esophageal reflux disease with esophagitis: Secondary | ICD-10-CM | POA: Insufficient documentation

## 2015-09-09 DIAGNOSIS — K2210 Ulcer of esophagus without bleeding: Secondary | ICD-10-CM | POA: Diagnosis not present

## 2015-09-09 DIAGNOSIS — K5730 Diverticulosis of large intestine without perforation or abscess without bleeding: Secondary | ICD-10-CM | POA: Diagnosis not present

## 2015-09-09 DIAGNOSIS — Z980 Intestinal bypass and anastomosis status: Secondary | ICD-10-CM | POA: Diagnosis not present

## 2015-09-09 DIAGNOSIS — K529 Noninfective gastroenteritis and colitis, unspecified: Secondary | ICD-10-CM | POA: Diagnosis not present

## 2015-09-09 DIAGNOSIS — R197 Diarrhea, unspecified: Secondary | ICD-10-CM | POA: Diagnosis not present

## 2015-09-09 DIAGNOSIS — K2950 Unspecified chronic gastritis without bleeding: Secondary | ICD-10-CM | POA: Diagnosis not present

## 2015-09-09 DIAGNOSIS — K5790 Diverticulosis of intestine, part unspecified, without perforation or abscess without bleeding: Secondary | ICD-10-CM | POA: Diagnosis not present

## 2015-09-09 DIAGNOSIS — K2990 Gastroduodenitis, unspecified, without bleeding: Secondary | ICD-10-CM | POA: Diagnosis not present

## 2015-09-09 DIAGNOSIS — K2980 Duodenitis without bleeding: Secondary | ICD-10-CM | POA: Diagnosis not present

## 2015-09-09 HISTORY — PX: ESOPHAGOGASTRODUODENOSCOPY (EGD) WITH PROPOFOL: SHX5813

## 2015-09-09 HISTORY — DX: Gastrointestinal hemorrhage, unspecified: K92.2

## 2015-09-09 HISTORY — DX: Gastro-esophageal reflux disease without esophagitis: K21.9

## 2015-09-09 HISTORY — PX: COLONOSCOPY WITH PROPOFOL: SHX5780

## 2015-09-09 LAB — KOH PREP: KOH Prep: NONE SEEN

## 2015-09-09 SURGERY — COLONOSCOPY WITH PROPOFOL
Anesthesia: General

## 2015-09-09 MED ORDER — SODIUM CHLORIDE 0.9 % IV SOLN: INTRAVENOUS | Status: DC

## 2015-09-09 MED ORDER — LIDOCAINE 2% (20 MG/ML) 5 ML SYRINGE
INTRAMUSCULAR | Status: DC | PRN
  Administered 2015-09-09: 40 mg via INTRAVENOUS

## 2015-09-09 MED ORDER — MIDAZOLAM HCL 5 MG/5ML IJ SOLN
INTRAMUSCULAR | Status: DC | PRN
  Administered 2015-09-09: 1 mg via INTRAVENOUS

## 2015-09-09 MED ORDER — SODIUM CHLORIDE 0.9 % IV SOLN
INTRAVENOUS | Status: DC
  Administered 2015-09-09: 11:00:00 via INTRAVENOUS

## 2015-09-09 MED ORDER — PROPOFOL 10 MG/ML IV BOLUS
INTRAVENOUS | Status: DC | PRN
  Administered 2015-09-09: 100 mg via INTRAVENOUS

## 2015-09-09 MED ORDER — PROPOFOL 500 MG/50ML IV EMUL
INTRAVENOUS | Status: DC | PRN
  Administered 2015-09-09: 140 ug/kg/min via INTRAVENOUS

## 2015-09-09 MED ORDER — FENTANYL CITRATE (PF) 100 MCG/2ML IJ SOLN
INTRAMUSCULAR | Status: DC | PRN
  Administered 2015-09-09: 50 ug via INTRAVENOUS

## 2015-09-09 NOTE — Op Note (Signed)
[facility] Gastroenterology Patient Name: Brandy Haley Procedure Date: 09/09/2015 11:08 AM MRN: [id number] Account #: [id number] Date of Birth: 07/15/1955 Admit Type: Outpatient Age: 60 Room: [facility] ENDO ROOM 3 Gender: Female Note Status: Finalized Procedure:            Colonoscopy Indications:          Clinically significant diarrhea of unexplained origin,                        Abnormal CT of the GI tract Providers:            Lollie Sails, MD Medicines:            Monitored Anesthesia Care Complications:        No immediate complications. Procedure:            Pre-Anesthesia Assessment:                       - ASA Grade Assessment: II - A patient with mild                        systemic disease.                       After obtaining informed consent, the colonoscope was                        passed under direct vision. Throughout the procedure,                        the patient's blood pressure, pulse, and oxygen                        saturations were monitored continuously. The                        Colonoscope was introduced through the anus and                        advanced to the the cecum, identified by appendiceal                        orifice and ileocecal valve. The colonoscopy was                        performed without difficulty. The patient tolerated the                        procedure well. The quality of the bowel preparation                        was good. Findings:      There was evidence of a prior functional end-to-end colo-colonic       anastomosis from 8 to 10 cm proximal to the anus. This was patent and       was characterized by healthy appearing mucosa and an intact staple line.      Two sessile polyps were found in the ascending colon. The polyps were 2       mm in size. These polyps were removed with a cold biopsy forceps.       Resection and retrieval  were complete.      The IC valve was turned and noted to be  normal.      Biopsies for histology were taken with a cold forceps from the right       colon and left colon for evaluation of microscopic colitis.      A few medium-mouthed diverticula were found in the descending colon. Impression:           - Patent functional end-to-end colo-colonic                        anastomosis, characterized by healthy appearing mucosa                        and an intact staple line.                       - Two 2 mm polyps in the ascending colon, removed with                        a cold biopsy forceps. Resected and retrieved.                       - Diverticulosis in the descending colon.                       - Biopsies were taken with a cold forceps from the                        right colon and left colon for evaluation of                        microscopic colitis. Recommendation:       - Discharge patient to home.                       - Await pathology results.                       - Return to GI clinic in 1 month.                       - Soft diet for 2 days. Procedure Code(s):    --- Professional ---                       [phone], Colonoscopy, flexible; with biopsy, single or                        multiple Diagnosis Code(s):    --- Professional ---                       Z98.0, Intestinal bypass and anastomosis status                       D12.2, Benign neoplasm of ascending colon                       R19.7, Diarrhea, unspecified                       K57.30, Diverticulosis of large intestine without  perforation or abscess without bleeding                       R93.3, Abnormal findings on diagnostic imaging of other                        parts of digestive tract CPT copyright 2016 American Medical Association. All rights reserved. The codes documented in this report are preliminary and upon coder review may  be revised to meet current compliance requirements. Lollie Sails, MD 09/09/2015 12:21:02 PM This report has  been signed electronically. Number of Addenda: 0 Note Initiated On: 09/09/2015 11:08 AM Scope Withdrawal Time: 0 hours 9 minutes 51 seconds  Total Procedure Duration: 0 hours 18 minutes 13 seconds       [facility]

## 2015-09-09 NOTE — Transfer of Care (Signed)
Immediate Anesthesia Transfer of Care Note  Patient: Brandy Haley  Procedure(s) Performed: Procedure(s): COLONOSCOPY WITH PROPOFOL (N/A) ESOPHAGOGASTRODUODENOSCOPY (EGD) WITH PROPOFOL (N/A)  Patient Location: PACU and Endoscopy Unit  Anesthesia Type:General  Level of Consciousness: sedated  Airway & Oxygen Therapy: Patient Spontanous Breathing and Patient connected to nasal cannula oxygen  Post-op Assessment: Report given to RN and Post -op Vital signs reviewed and stable  Post vital signs: Reviewed and stable  Last Vitals:  Vitals:   09/09/15 1013  BP: (!) 152/77  Pulse: 72  Resp: 18  Temp: 36.7 C    Last Pain:  Vitals:   09/09/15 1013  TempSrc: Tympanic         Complications: No apparent anesthesia complications

## 2015-09-09 NOTE — Anesthesia Preprocedure Evaluation (Signed)
Anesthesia Evaluation  Patient identified by MRN, date of birth, ID band Patient awake    Reviewed: Allergy & Precautions, NPO status , Patient's Chart, lab work & pertinent test results  History of Anesthesia Complications Negative for: history of anesthetic complications  Airway Mallampati: II  TM Distance: >3 FB Neck ROM: Full    Dental no notable dental hx.    Pulmonary neg pulmonary ROS, neg sleep apnea, neg COPD,    breath sounds clear to auscultation- rhonchi (-) wheezing      Cardiovascular Exercise Tolerance: Good hypertension, (-) CAD and (-) Past MI  Rhythm:Regular Rate:Normal - Systolic murmurs and - Diastolic murmurs    Neuro/Psych negative neurological ROS  negative psych ROS   GI/Hepatic Neg liver ROS, GERD  ,  Endo/Other  negative endocrine ROSneg diabetes  Renal/GU negative Renal ROS     Musculoskeletal negative musculoskeletal ROS (+)   Abdominal (+) - obese,   Peds  Hematology  (+) anemia ,   Anesthesia Other Findings   Reproductive/Obstetrics                             Anesthesia Physical Anesthesia Plan  ASA: II  Anesthesia Plan: General   Post-op Pain Management:    Induction: Intravenous  Airway Management Planned: Natural Airway  Additional Equipment:   Intra-op Plan:   Post-operative Plan:   Informed Consent: I have reviewed the patients History and Physical, chart, labs and discussed the procedure including the risks, benefits and alternatives for the proposed anesthesia with the patient or authorized representative who has indicated his/her understanding and acceptance.   Dental advisory given  Plan Discussed with: CRNA and Anesthesiologist  Anesthesia Plan Comments:         Anesthesia Quick Evaluation

## 2015-09-09 NOTE — Op Note (Signed)
[facility] Gastroenterology Patient Name: Brandy Haley Procedure Date: 09/09/2015 11:08 AM MRN: [id number] Account #: [id number] Date of Birth: 26-Dec-1955 Admit Type: Outpatient Age: 60 Room: [facility] ENDO ROOM 3 Gender: Female Note Status: Finalized Procedure:            Upper GI endoscopy Indications:          Dysphagia, Diarrhea Providers:            Lollie Sails, MD Referring MD:         Lorin Mercy. Hamrick (Referring MD) Medicines:            Monitored Anesthesia Care Complications:        No immediate complications. Procedure:            Pre-Anesthesia Assessment:                       - ASA Grade Assessment: II - A patient with mild                        systemic disease.                       After obtaining informed consent, the endoscope was                        passed under direct vision. Throughout the procedure,                        the patient's blood pressure, pulse, and oxygen                        saturations were monitored continuously. The Endoscope                        was introduced through the mouth, and advanced to the                        third part of duodenum. The upper GI endoscopy was                        accomplished without difficulty. The patient tolerated                        the procedure well. Findings:      LA Grade B (one or more mucosal breaks greater than 5 mm, not extending       between the tops of two mucosal folds) esophagitis with no bleeding was       found. Biopsies were taken with a cold forceps for histology.      Abnormal motility was noted in the lower third of the esophagus. The       cricopharyngeus was normal. There is spasticity of the esophageal body.       The distal esophagus/lower esophageal sphincter is spastic, but gives up       passage to the endoscope. Tertiary peristaltic waves are noted.      Patchy candidiasis was found in the middle third of the esophagus. Cells       for cytology  were obtained by brushing.      No other significant abnormalities were identified in a careful       examination of  the esophagus.      Localized and patchy moderate inflammation characterized by congestion       (edema), erosions, linear erosions and shallow ulcerations was found at       the incisura and in the gastric antrum. Biopsies were taken with a cold       forceps for histology. Biopsies were taken with a cold forceps for       Helicobacter pylori testing.      The cardia and gastric fundus were normal on retroflexion.      Diffuse mild mucosal variance characterized by altered texture was found       in the entire duodenum. Biopsies were taken with a cold forceps for       histology.      The cardia and gastric fundus were normal on retroflexion. Impression:           - LA Grade B erosive esophagitis. Biopsied.                       - Abnormal esophageal motility, consistent with                        esophageal spasm.                       - Monilial esophagitis. Cells for cytology obtained.                       - Erosive gastritis. Biopsied.                       - Mucosal variant in the duodenum. Biopsied. Recommendation:       - Discharge patient to home.                       - Use Aciphex (rabeprazole) 20 mg PO BID for 3 weeks.                       - Use Aciphex (rabeprazole) 20 mg PO daily daily. Procedure Code(s):    --- Professional ---                       [phone], Esophagogastroduodenoscopy, flexible, transoral;                        with biopsy, single or multiple Diagnosis Code(s):    --- Professional ---                       K20.8, Other esophagitis                       K22.4, Dyskinesia of esophagus                       B37.81, Candidal esophagitis                       K29.60, Other gastritis without bleeding                       K31.89, Other diseases of stomach and duodenum                       R13.10,  Dysphagia, unspecified                        R19.7, Diarrhea, unspecified CPT copyright 2016 American Medical Association. All rights reserved. The codes documented in this report are preliminary and upon coder review may  be revised to meet current compliance requirements. Lollie Sails, MD 09/09/2015 11:51:04 AM This report has been signed electronically. Number of Addenda: 0 Note Initiated On: 09/09/2015 11:08 AM      [facility]

## 2015-09-09 NOTE — H&P (Signed)
Outpatient short stay form Pre-procedure 09/09/2015 11:18 AM Lollie Sails MD  Primary Physician: Dr. Pollyann Savoy  Reason for visit:  EGD and colonoscopy  History of present illness:  Patient is a 60 year old female presenting today as above. She does have a history of chronic dysphagia she does not regurgitate foods. There is a barium swallow indicating a possible narrowing in the distal esophagus. She did hang a barium tablet that point. She does take Zantac fairly regularly. She was seen in the hospital at [facility] in early May 2017 for an episodic event of bloody stools. She was felt to be infective in nature. She has done well since she went home. She does take a probiotic regularly now and this has been of quite a bit of benefit for her. Her stools are not as loose. She has seen no rectal bleeding. She takes no aspirin or blood thinning products. She tolerated her prep well.    Current Facility-Administered Medications:  .  0.9 %  sodium chloride infusion, , Intravenous, Continuous, Lollie Sails, MD, Last Rate: 20 mL/hr at 09/09/15 1032 .  0.9 %  sodium chloride infusion, , Intravenous, Continuous, Lollie Sails, MD  Prescriptions Prior to Admission  Medication Sig Dispense Refill Last Dose  . Calcium Carbonate-Vitamin D (CALCIUM 600+D) 600-400 MG-UNIT tablet Take 1 tablet by mouth daily.   Past Week at Unknown time  . pantoprazole (PROTONIX) 40 MG tablet Take 1 tablet (40 mg total) by mouth daily. 30 tablet 5 Past Week at Unknown time  . diphenhydrAMINE (BENADRYL) 25 mg capsule Take 1 capsule (25 mg total) by mouth every 6 (six) hours as needed for itching. 30 capsule 0   . feeding supplement (BOOST / RESOURCE BREEZE) LIQD Take 1 Container by mouth 3 (three) times daily between meals. 90 Container 0   . prochlorperazine (COMPAZINE) 5 MG tablet Take 1 tablet (5 mg total) by mouth every 6 (six) hours as needed for nausea or vomiting. 30 tablet 0   .  ranitidine (ZANTAC) 150 MG capsule Take 150 mg by mouth 2 (two) times daily.   Not Taking at Unknown time     Allergies  Allergen Reactions  . Ciprofloxacin Hives     Past Medical History:  Diagnosis Date  . GERD (gastroesophageal reflux disease)   . GI bleed   . Hypertension     Review of systems:      Physical Exam    Heart and lungs: Regular rate and rhythm without rub or gallop, lungs are bilaterally clear.    HEENT: Normocephalic atraumatic eyes are anicteric    Other:     Pertinant exam for procedure: Soft nontender nondistended bowel sounds positive normoactive.    Planned proceedures: EGD, colonoscopy and indicated procedures. I have discussed the risks benefits and complications of procedures to include not limited to bleeding, infection, perforation and the risk of sedation and the patient wishes to proceed.    Lollie Sails, MD Gastroenterology 09/09/2015  11:18 AM

## 2015-09-09 NOTE — Anesthesia Postprocedure Evaluation (Signed)
Anesthesia Post Note  Patient: Brandy Haley  Procedure(s) Performed: Procedure(s) (LRB): COLONOSCOPY WITH PROPOFOL (N/A) ESOPHAGOGASTRODUODENOSCOPY (EGD) WITH PROPOFOL (N/A)  Patient location during evaluation: Endoscopy Anesthesia Type: General Level of consciousness: awake and alert and oriented Pain management: pain level controlled Vital Signs Assessment: post-procedure vital signs reviewed and stable Respiratory status: spontaneous breathing, nonlabored ventilation and respiratory function stable Cardiovascular status: blood pressure returned to baseline and stable Postop Assessment: no signs of nausea or vomiting Anesthetic complications: no    Last Vitals:  Vitals:   09/09/15 1250 09/09/15 1300  BP: (!) 134/95 (!) 162/88  Pulse: (!) 58 (!) 53  Resp: 11 16  Temp:      Last Pain:  Vitals:   09/09/15 1220  TempSrc: Tympanic                 Anup Brigham

## 2015-09-10 ENCOUNTER — Encounter: Payer: Self-pay | Admitting: Gastroenterology

## 2015-09-10 LAB — SURGICAL PATHOLOGY

## 2015-10-06 DIAGNOSIS — Z6823 Body mass index (BMI) 23.0-23.9, adult: Secondary | ICD-10-CM | POA: Diagnosis not present

## 2015-10-06 DIAGNOSIS — E7800 Pure hypercholesterolemia, unspecified: Secondary | ICD-10-CM | POA: Diagnosis not present

## 2015-10-06 DIAGNOSIS — R740 Nonspecific elevation of levels of transaminase and lactic acid dehydrogenase [LDH]: Secondary | ICD-10-CM | POA: Diagnosis not present

## 2015-10-06 DIAGNOSIS — Z9181 History of falling: Secondary | ICD-10-CM | POA: Diagnosis not present

## 2015-10-06 DIAGNOSIS — I10 Essential (primary) hypertension: Secondary | ICD-10-CM | POA: Diagnosis not present

## 2015-10-06 DIAGNOSIS — Z1389 Encounter for screening for other disorder: Secondary | ICD-10-CM | POA: Diagnosis not present

## 2015-10-06 DIAGNOSIS — D500 Iron deficiency anemia secondary to blood loss (chronic): Secondary | ICD-10-CM | POA: Diagnosis not present

## 2015-10-06 DIAGNOSIS — G8921 Chronic pain due to trauma: Secondary | ICD-10-CM | POA: Diagnosis not present

## 2015-10-20 ENCOUNTER — Emergency Department
Admission: EM | Admit: 2015-10-20 | Discharge: 2015-10-20 | Disposition: A | Discharge status: Home / Self Care | Payer: Medicare Other | Attending: Emergency Medicine | Admitting: Emergency Medicine

## 2015-10-20 ENCOUNTER — Encounter: Payer: Self-pay | Admitting: Emergency Medicine

## 2015-10-20 DIAGNOSIS — I10 Essential (primary) hypertension: Secondary | ICD-10-CM | POA: Diagnosis not present

## 2015-10-20 DIAGNOSIS — Z79899 Other long term (current) drug therapy: Secondary | ICD-10-CM | POA: Diagnosis not present

## 2015-10-20 DIAGNOSIS — Z86010 Personal history of colonic polyps: Secondary | ICD-10-CM | POA: Diagnosis not present

## 2015-10-20 DIAGNOSIS — R197 Diarrhea, unspecified: Secondary | ICD-10-CM | POA: Diagnosis not present

## 2015-10-20 DIAGNOSIS — K219 Gastro-esophageal reflux disease without esophagitis: Secondary | ICD-10-CM | POA: Diagnosis not present

## 2015-10-20 DIAGNOSIS — R51 Headache: Secondary | ICD-10-CM | POA: Diagnosis not present

## 2015-10-20 LAB — CBC
HCT: 42.8 % (ref 35.0–47.0)
Hemoglobin: 14.7 g/dL (ref 12.0–16.0)
MCH: 32.9 pg (ref 26.0–34.0)
MCHC: 34.3 g/dL (ref 32.0–36.0)
MCV: 95.8 fL (ref 80.0–100.0)
PLATELETS: 249 10*3/uL (ref 150–440)
RBC: 4.46 MIL/uL (ref 3.80–5.20)
RDW: 12.9 % (ref 11.5–14.5)
WBC: 4.4 10*3/uL (ref 3.6–11.0)

## 2015-10-20 LAB — COMPREHENSIVE METABOLIC PANEL
ALBUMIN: 4.3 g/dL (ref 3.5–5.0)
ALT: 44 U/L (ref 14–54)
AST: 29 U/L (ref 15–41)
Alkaline Phosphatase: 79 U/L (ref 38–126)
Anion gap: 8 (ref 5–15)
BUN: 13 mg/dL (ref 6–20)
CHLORIDE: 103 mmol/L (ref 101–111)
CO2: 27 mmol/L (ref 22–32)
Calcium: 9.8 mg/dL (ref 8.9–10.3)
Creatinine, Ser: 0.66 mg/dL (ref 0.44–1.00)
GFR calc Af Amer: >60 mL/min (ref >60)
Glucose, Bld: 94 mg/dL (ref 65–99)
POTASSIUM: 4.5 mmol/L (ref 3.5–5.1)
Sodium: 138 mmol/L (ref 135–145)
Total Bilirubin: 0.8 mg/dL (ref 0.3–1.2)
Total Protein: 8.5 g/dL — ABNORMAL HIGH (ref 6.5–8.1)

## 2015-10-20 LAB — TROPONIN I

## 2015-10-20 MED ORDER — LISINOPRIL 2.5 MG PO TABS: 2.5000 mg | ORAL_TABLET | Freq: Every day | ORAL | 0 refills | Status: DC

## 2015-10-20 NOTE — ED Provider Notes (Signed)
Time Seen: Approximately 1400  I have reviewed the triage notes  Chief Complaint: Hypertension   History of Present Illness: Brandy Haley is a 60 y.o. female who presents with an intermittent sharp right-sided headache that started this morning and now resolved. Patient's had some borderline hypertension issues and has lisinopril that she's been advised to take as needed. Patient states that she knows her blood pressures been running elevated. She denies any nausea, vomiting, neck pain, visual disturbances, photophobia etc. Past Medical History:  Diagnosis Date  . GERD (gastroesophageal reflux disease)   . GI bleed   . Hypertension     Patient Active Problem List   Diagnosis Date Noted  . Acute colitis 05/22/2015  . Diarrhea 05/22/2015  . Hypotension 05/22/2015  . Acute posthemorrhagic anemia 05/22/2015  . Hyperglycemia 05/22/2015  . Rectal bleed 05/19/2015    Past Surgical History:  Procedure Laterality Date  . APPENDECTOMY    . CHOLECYSTECTOMY    . COLONOSCOPY WITH PROPOFOL N/A 09/09/2015   Procedure: COLONOSCOPY WITH PROPOFOL;  Surgeon: Lollie Sails, MD;  Location: [facility] ENDOSCOPY;  Service: Endoscopy;  Laterality: N/A;  . COLOSTOMY    . COLOSTOMY REVERSAL    . ESOPHAGOGASTRODUODENOSCOPY (EGD) WITH PROPOFOL N/A 09/09/2015   Procedure: ESOPHAGOGASTRODUODENOSCOPY (EGD) WITH PROPOFOL;  Surgeon: Lollie Sails, MD;  Location: [facility] ENDOSCOPY;  Service: Endoscopy;  Laterality: N/A;    Past Surgical History:  Procedure Laterality Date  . APPENDECTOMY    . CHOLECYSTECTOMY    . COLONOSCOPY WITH PROPOFOL N/A 09/09/2015   Procedure: COLONOSCOPY WITH PROPOFOL;  Surgeon: Lollie Sails, MD;  Location: [facility] ENDOSCOPY;  Service: Endoscopy;  Laterality: N/A;  . COLOSTOMY    . COLOSTOMY REVERSAL    . ESOPHAGOGASTRODUODENOSCOPY (EGD) WITH PROPOFOL N/A 09/09/2015   Procedure: ESOPHAGOGASTRODUODENOSCOPY (EGD) WITH PROPOFOL;  Surgeon: Lollie Sails, MD;  Location: [facility]  ENDOSCOPY;  Service: Endoscopy;  Laterality: N/A;    Current Outpatient Rx  . Order #: [id number] Class: Historical Med  . Order #: [id number] Class: Normal  . Order #: [id number] Class: Normal  . Order #: [id number] Class: Print  . Order #: [id number] Class: Normal  . Order #: [id number] Class: Normal  . Order #: [id number] Class: Historical Med    Allergies:  Ciprofloxacin  Family History: No family history on file.  Social History: Social History  Substance Use Topics  . Smoking status: Never Smoker  . Smokeless tobacco: Never Used  . Alcohol use No     Review of Systems:   10 point review of systems was performed and was otherwise negative:  Constitutional: No fever Eyes: No visual disturbances ENT: No sore throat, ear pain Cardiac: No chest pain Respiratory: No shortness of breath, wheezing, or stridor Abdomen: No abdominal pain, no vomiting, No diarrhea Endocrine: No weight loss, No night sweats Extremities: No peripheral edema, cyanosis Skin: No rashes, easy bruising Neurologic: No focal weakness, trouble with speech or swollowing Urologic: No dysuria, Hematuria, or urinary frequency   Physical Exam:  ED Triage Vitals  Enc Vitals Group     BP 10/20/15 1103 (!) 174/100     Pulse Rate 10/20/15 1103 72     Resp 10/20/15 1103 16     Temp 10/20/15 1103 98.5 F (36.9 C)     Temp Source 10/20/15 1103 Oral     SpO2 10/20/15 1103 98 %     Weight 10/20/15 1104 131 lb (59.4 kg)     Height 10/20/15 1104 5' 2.5" (1.588 m)     Head  Circumference --      Peak Flow --      Pain Score 10/20/15 1357 2     Pain Loc --      Pain Edu? --      Excl. in [place]? --     General: Awake , Alert , and Oriented times 3; GCS 15 Head: Normal cephalic , atraumatic Eyes: Pupils equal , round, reactive to light Nose/Throat: No nasal drainage, patent upper airway without erythema or exudate.  Neck: Supple, Full range of motion, No anterior adenopathy or palpable thyroid masses Lungs: Clear  to ascultation without wheezes , rhonchi, or rales Heart: Regular rate, regular rhythm without murmurs , gallops , or rubs Abdomen: Soft, non tender without rebound, guarding , or rigidity; bowel sounds positive and symmetric in all 4 quadrants. No organomegaly .        Extremities: 2 plus symmetric pulses. No edema, clubbing or cyanosis Neurologic: normal ambulation, Motor symmetric without deficits, sensory intact Skin: warm, dry, no rashes   Labs:   All laboratory work was reviewed including any pertinent negatives or positives listed below:  Labs Reviewed  COMPREHENSIVE METABOLIC PANEL - Abnormal; Notable for the following:       Result Value   Total Protein 8.5 (*)    All other components within normal limits  CBC  TROPONIN I    EKG:  ED ECG REPORT I, Daymon Larsen, the attending physician, personally viewed and interpreted this ECG.  Date: 10/20/2015 EKG Time: 1112 Rate:79 Rhythm: normal sinus rhythm QRS Axis: normal Intervals: normal ST/T Wave abnormalities: normal Conduction Disturbances: none Narrative Interpretation: unremarkable *ischemic changes   ED Course: Patient's stay here was uneventful and I felt that her headache was not a life-threatening variety such as subarachnoid hemorrhage, cerebral vascular accident, etc. The patient does have some elevated blood pressure and has had some hypertension in the past. It seems that she needs regular medication and I prescribed her 2.5 mg of lisinopril to take on a daily basis as opposed to as needed. Patient agrees to follow up with her primary physician and she was given hypertension instructions and given advice to have a low threshold to return here for any persistent headache, fever, neck pain, nausea, vomiting, etc. Clinical Course     Assessment:  Hypertensive urgency   Final Clinical Impression: * Final diagnoses:  Hypertension, unspecified type     Plan:  Outpatient " New Prescriptions    LISINOPRIL (PRINIVIL,ZESTRIL) 2.5 MG TABLET    Take 1 tablet (2.5 mg total) by mouth daily.  " Patient was advised to return immediately if condition worsens. Patient was advised to follow up with their primary care physician or other specialized physicians involved in their outpatient care. The patient and/or family member/power of attorney had laboratory results reviewed at the bedside. All questions and concerns were addressed and appropriate discharge instructions were distributed by the nursing staff.             Daymon Larsen, MD 10/20/15 [phone]

## 2015-10-20 NOTE — Discharge Instructions (Signed)
Please return immediately if condition worsens. Please contact her primary physician or the physician you were given for referral. If you have any specialist physicians involved in her treatment and plan please also contact them. Thank you for using [facility]. ° °

## 2015-10-20 NOTE — ED Triage Notes (Signed)
Blood pressure noted hight today a GI clinic, sent here for evaluation.

## 2015-10-27 ENCOUNTER — Encounter: Payer: Self-pay | Admitting: Emergency Medicine

## 2015-10-27 ENCOUNTER — Emergency Department: Payer: Medicare Other

## 2015-10-27 ENCOUNTER — Emergency Department
Admission: EM | Admit: 2015-10-27 | Discharge: 2015-10-27 | Disposition: A | Discharge status: Home / Self Care | Payer: Medicare Other | Attending: Emergency Medicine | Admitting: Emergency Medicine

## 2015-10-27 DIAGNOSIS — I10 Essential (primary) hypertension: Secondary | ICD-10-CM | POA: Diagnosis not present

## 2015-10-27 DIAGNOSIS — Z79899 Other long term (current) drug therapy: Secondary | ICD-10-CM | POA: Insufficient documentation

## 2015-10-27 DIAGNOSIS — R519 Headache, unspecified: Secondary | ICD-10-CM

## 2015-10-27 DIAGNOSIS — R51 Headache: Secondary | ICD-10-CM | POA: Diagnosis not present

## 2015-10-27 LAB — SEDIMENTATION RATE: Sed Rate: 43 mm/hr — ABNORMAL HIGH (ref 0–30)

## 2015-10-27 MED ORDER — TRAMADOL HCL 50 MG PO TABS: 50.0000 mg | ORAL_TABLET | Freq: Four times a day (QID) | ORAL | 0 refills | Status: DC | PRN

## 2015-10-27 NOTE — Discharge Instructions (Signed)
Please return immediately if condition worsens. Please contact her primary physician or the physician you were given for referral. If you have any specialist physicians involved in her treatment and plan please also contact them. Thank you for using [place] [facility] emergency [facility].  Please take over-the-counter pain medication such as Tylenol and ibuprofen and continue all of her other current medications. Return to the emergency department especially for increasing headache, weakness, fever, vomiting, or any other new concerns.

## 2015-10-27 NOTE — ED Triage Notes (Signed)
Pt returns today with right ear pain and headache. Pt was seen here for same last week and dx with HTN. Pt states she had some tramadol at home that she has been taking for HA and has been helping some. Pt c/o right side headache with sharp stabbing pains, denies any vision changes.

## 2015-10-27 NOTE — ED Provider Notes (Signed)
Time Seen: Approximately 1121  I have reviewed the triage notes  Chief Complaint: Headache and Otalgia   History of Present Illness: Brandy Haley is a 60 y.o. female *fluoride seen and evaluated approximately a week ago for hypertension. She states she still has intermittent right-sided headache. She denies any blurred vision, loss of vision, neck pain, fever, focal weakness in either upper or lower extremities. She denies any sinus discharge or drainage. Patient describes her headache is strictly right-sided sharp in nature and states it's trauma relieved by tramadol. She tried to get in to see her primary physician and she of course was referred here to the emergency department for further evaluation. Patient denies any trauma and states her blood pressures been under good control after starting the lisinopril again the basis.   Past Medical History:  Diagnosis Date  . GERD (gastroesophageal reflux disease)   . GI bleed   . Hypertension     Patient Active Problem List   Diagnosis Date Noted  . Acute colitis 05/22/2015  . Diarrhea 05/22/2015  . Hypotension 05/22/2015  . Acute posthemorrhagic anemia 05/22/2015  . Hyperglycemia 05/22/2015  . Rectal bleed 05/19/2015    Past Surgical History:  Procedure Laterality Date  . APPENDECTOMY    . CHOLECYSTECTOMY    . COLONOSCOPY WITH PROPOFOL N/A 09/09/2015   Procedure: COLONOSCOPY WITH PROPOFOL;  Surgeon: Lollie Sails, MD;  Location: [facility] ENDOSCOPY;  Service: Endoscopy;  Laterality: N/A;  . COLOSTOMY    . COLOSTOMY REVERSAL    . ESOPHAGOGASTRODUODENOSCOPY (EGD) WITH PROPOFOL N/A 09/09/2015   Procedure: ESOPHAGOGASTRODUODENOSCOPY (EGD) WITH PROPOFOL;  Surgeon: Lollie Sails, MD;  Location: [facility] ENDOSCOPY;  Service: Endoscopy;  Laterality: N/A;    Past Surgical History:  Procedure Laterality Date  . APPENDECTOMY    . CHOLECYSTECTOMY    . COLONOSCOPY WITH PROPOFOL N/A 09/09/2015   Procedure: COLONOSCOPY WITH PROPOFOL;   Surgeon: Lollie Sails, MD;  Location: [facility] ENDOSCOPY;  Service: Endoscopy;  Laterality: N/A;  . COLOSTOMY    . COLOSTOMY REVERSAL    . ESOPHAGOGASTRODUODENOSCOPY (EGD) WITH PROPOFOL N/A 09/09/2015   Procedure: ESOPHAGOGASTRODUODENOSCOPY (EGD) WITH PROPOFOL;  Surgeon: Lollie Sails, MD;  Location: [facility] ENDOSCOPY;  Service: Endoscopy;  Laterality: N/A;    Current Outpatient Rx  . Order #: [id number] Class: Historical Med  . Order #: [id number] Class: Normal  . Order #: [id number] Class: Normal  . Order #: [id number] Class: Print  . Order #: [id number] Class: Normal  . Order #: [id number] Class: Normal  . Order #: [id number] Class: Historical Med  . Order #: [id number] Class: Print    Allergies:  Ciprofloxacin  Family History: No family history on file.  Social History: Social History  Substance Use Topics  . Smoking status: Never Smoker  . Smokeless tobacco: Never Used  . Alcohol use No     Review of Systems:   10 point review of systems was performed and was otherwise negative:  Constitutional: No fever Eyes: No visual disturbances. She denies any eye pain or blind spots. ENT: No sore throat, ear pain Cardiac: No chest pain Respiratory: No shortness of breath, wheezing, or stridor Abdomen: No abdominal pain, no vomiting, No diarrhea Endocrine: No weight loss, No night sweats Extremities: No peripheral edema, cyanosis Skin: No rashes, easy bruising Neurologic: No focal weakness, trouble with speech or swollowing Urologic: No dysuria, Hematuria, or urinary frequency   Physical Exam:  ED Triage Vitals  Enc Vitals Group     BP 10/27/15 0947 126/80     Pulse  Rate 10/27/15 0947 75     Resp 10/27/15 0947 18     Temp 10/27/15 0947 98.3 F (36.8 C)     Temp Source 10/27/15 0947 Oral     SpO2 10/27/15 0947 97 %     Weight 10/27/15 0947 130 lb (59 kg)     Height 10/27/15 0947 5\' 2"  (1.575 m)     Head Circumference --      Peak Flow --      Pain Score 10/27/15 0959 2      Pain Loc --      Pain Edu? --      Excl. in [place]? --     General: Awake , Alert , and Oriented times 3; GCS 15 Head: Normal cephalic , atraumatic. No visible rashes. No reproducible pain with palpation across the right temporal parietal area Eyes: Pupils equal , round, reactive to light Nose/Throat: No nasal drainage, patent upper airway without erythema or exudate.  Neck: Supple, Full range of motion, No anterior adenopathy or palpable thyroid masses Lungs: Clear to ascultation without wheezes , rhonchi, or rales Heart: Regular rate, regular rhythm without murmurs , gallops , or rubs Abdomen: Soft, non tender without rebound, guarding , or rigidity; bowel sounds positive and symmetric in all 4 quadrants. No organomegaly .        Extremities: 2 plus symmetric pulses. No edema, clubbing or cyanosis Neurologic: normal ambulation, Motor symmetric without deficits, sensory intact Skin: warm, dry, no rashes   Labs:   All laboratory work was reviewed including any pertinent negatives or positives listed below:  Labs Reviewed  SEDIMENTATION RATE - Abnormal; Notable for the following:       Result Value   Sed Rate 43 (*)    All other components within normal limits   Her sedimentation rate is slightly elevated though still less than 50  Radiology:  "Ct Head Wo Contrast  Result Date: 10/27/2015 CLINICAL DATA:  Right sided headache x 1 week. No trauma. Hx of HTN. No hx cancer, stroke or seizure. EXAM: CT HEAD WITHOUT CONTRAST TECHNIQUE: Contiguous axial images were obtained from the base of the skull through the vertex without intravenous contrast. COMPARISON:  None. FINDINGS: Brain: Mild atrophy. No evidence of acute infarction, hemorrhage, hydrocephalus, extra-axial collection or mass lesion/mass effect. Vascular: No hyperdense vessel or unexpected calcification. Skull: Normal. Negative for fracture or focal lesion. Sinuses/Orbits: No acute finding. Other: None. IMPRESSION: 1. Negative for  bleed or other acute intracranial process. Electronically Signed   By: Lucrezia Europe M.D.   On: 10/27/2015 11:50  " I personally reviewed the radiologic studies    ED Course: * Given the patient's current clinical presentation and objective findings I felt this was unlikely to be a life threatening headache such as acute meningitis, acute encephalitis, temporal arteritis, subarachnoid hemorrhage, intracranial mass or tumor, etc. Her pain seems to be controllable and she has no focal neurologic deficits. I felt she did not require lumbar puncture evaluation at this time as I don't have a strong clinical suspicion for meningitis, encephalitis, or subarachnoid hemorrhage. The patient was advised her workup is not complete and she needs touch base with her primary physician for possible neurology referral or outpatient MRI, etc. Clinical Course     Assessment: Acute right-sided cephalgia  Final Clinical Impression:  Final diagnoses:  Acute nonintractable headache, unspecified headache type     Plan: ** Outpatient " Discharge Medication List as of 10/27/2015  1:30 PM    START  taking these medications   Details  traMADol (ULTRAM) 50 MG tablet Take 1 tablet (50 mg total) by mouth every 6 (six) hours as needed., Starting Mon 10/27/2015, Print      " Patient was advised to return immediately if condition worsens. Patient was advised to follow up with their primary care physician or other specialized physicians involved in their outpatient care. The patient and/or family member/power of attorney had laboratory results reviewed at the bedside. All questions and concerns were addressed and appropriate discharge instructions were distributed by the nursing staff.             Daymon Larsen, MD 10/27/15 [phone]

## 2016-02-03 DIAGNOSIS — R197 Diarrhea, unspecified: Secondary | ICD-10-CM | POA: Diagnosis not present

## 2016-02-03 DIAGNOSIS — R6889 Other general symptoms and signs: Secondary | ICD-10-CM | POA: Diagnosis not present

## 2016-02-03 DIAGNOSIS — Z6822 Body mass index (BMI) 22.0-22.9, adult: Secondary | ICD-10-CM | POA: Diagnosis not present

## 2016-03-01 ENCOUNTER — Ambulatory Visit: Payer: PRIVATE HEALTH INSURANCE | Admitting: Adult Health

## 2016-08-05 DIAGNOSIS — Z6823 Body mass index (BMI) 23.0-23.9, adult: Secondary | ICD-10-CM | POA: Diagnosis not present

## 2016-08-05 DIAGNOSIS — N390 Urinary tract infection, site not specified: Secondary | ICD-10-CM | POA: Diagnosis not present

## 2016-08-05 DIAGNOSIS — R300 Dysuria: Secondary | ICD-10-CM | POA: Diagnosis not present

## 2016-08-27 DIAGNOSIS — M545 Low back pain: Secondary | ICD-10-CM | POA: Diagnosis not present

## 2016-08-27 DIAGNOSIS — Z6822 Body mass index (BMI) 22.0-22.9, adult: Secondary | ICD-10-CM | POA: Diagnosis not present

## 2016-08-27 DIAGNOSIS — Z1389 Encounter for screening for other disorder: Secondary | ICD-10-CM | POA: Diagnosis not present

## 2016-08-27 DIAGNOSIS — N390 Urinary tract infection, site not specified: Secondary | ICD-10-CM | POA: Diagnosis not present

## 2016-10-19 DIAGNOSIS — I10 Essential (primary) hypertension: Secondary | ICD-10-CM | POA: Diagnosis not present

## 2016-10-19 DIAGNOSIS — Z6823 Body mass index (BMI) 23.0-23.9, adult: Secondary | ICD-10-CM | POA: Diagnosis not present

## 2016-10-19 DIAGNOSIS — Z79899 Other long term (current) drug therapy: Secondary | ICD-10-CM | POA: Diagnosis not present

## 2016-10-19 DIAGNOSIS — Z23 Encounter for immunization: Secondary | ICD-10-CM | POA: Diagnosis not present

## 2016-10-19 DIAGNOSIS — E7800 Pure hypercholesterolemia, unspecified: Secondary | ICD-10-CM | POA: Diagnosis not present

## 2016-10-19 DIAGNOSIS — G8921 Chronic pain due to trauma: Secondary | ICD-10-CM | POA: Diagnosis not present

## 2017-01-14 DIAGNOSIS — N390 Urinary tract infection, site not specified: Secondary | ICD-10-CM | POA: Diagnosis not present

## 2017-01-14 DIAGNOSIS — Z6824 Body mass index (BMI) 24.0-24.9, adult: Secondary | ICD-10-CM | POA: Diagnosis not present

## 2017-03-04 DIAGNOSIS — M545 Low back pain: Secondary | ICD-10-CM | POA: Diagnosis not present

## 2017-03-04 DIAGNOSIS — S39012A Strain of muscle, fascia and tendon of lower back, initial encounter: Secondary | ICD-10-CM | POA: Diagnosis not present

## 2017-04-11 DIAGNOSIS — I10 Essential (primary) hypertension: Secondary | ICD-10-CM | POA: Diagnosis not present

## 2017-04-11 DIAGNOSIS — N3091 Cystitis, unspecified with hematuria: Secondary | ICD-10-CM | POA: Diagnosis not present

## 2017-04-11 DIAGNOSIS — Z6823 Body mass index (BMI) 23.0-23.9, adult: Secondary | ICD-10-CM | POA: Diagnosis not present

## 2017-04-19 ENCOUNTER — Other Ambulatory Visit: Payer: Self-pay | Admitting: Family Medicine

## 2017-04-19 DIAGNOSIS — Z6823 Body mass index (BMI) 23.0-23.9, adult: Secondary | ICD-10-CM | POA: Diagnosis not present

## 2017-04-19 DIAGNOSIS — Z139 Encounter for screening, unspecified: Secondary | ICD-10-CM

## 2017-04-19 DIAGNOSIS — Z1231 Encounter for screening mammogram for malignant neoplasm of breast: Secondary | ICD-10-CM | POA: Diagnosis not present

## 2017-04-19 DIAGNOSIS — R740 Nonspecific elevation of levels of transaminase and lactic acid dehydrogenase [LDH]: Secondary | ICD-10-CM | POA: Diagnosis not present

## 2017-04-19 DIAGNOSIS — Z780 Asymptomatic menopausal state: Secondary | ICD-10-CM | POA: Diagnosis not present

## 2017-04-19 DIAGNOSIS — R5381 Other malaise: Secondary | ICD-10-CM

## 2017-04-19 DIAGNOSIS — M545 Low back pain: Secondary | ICD-10-CM | POA: Diagnosis not present

## 2017-04-19 DIAGNOSIS — I10 Essential (primary) hypertension: Secondary | ICD-10-CM | POA: Diagnosis not present

## 2017-04-19 DIAGNOSIS — E7800 Pure hypercholesterolemia, unspecified: Secondary | ICD-10-CM | POA: Diagnosis not present

## 2017-04-20 ENCOUNTER — Other Ambulatory Visit: Payer: Self-pay | Admitting: Family Medicine

## 2017-04-20 DIAGNOSIS — Z780 Asymptomatic menopausal state: Secondary | ICD-10-CM

## 2017-05-30 DIAGNOSIS — N3091 Cystitis, unspecified with hematuria: Secondary | ICD-10-CM | POA: Diagnosis not present

## 2017-05-30 DIAGNOSIS — Z6823 Body mass index (BMI) 23.0-23.9, adult: Secondary | ICD-10-CM | POA: Diagnosis not present

## 2017-06-08 ENCOUNTER — Ambulatory Visit
Admission: RE | Admit: 2017-06-08 | Discharge: 2017-06-08 | Disposition: A | Discharge status: Home / Self Care | Payer: Medicare PPO | Source: Ambulatory Visit | Attending: Family Medicine | Admitting: Family Medicine

## 2017-06-08 ENCOUNTER — Ambulatory Visit
Admission: RE | Admit: 2017-06-08 | Discharge: 2017-06-08 | Disposition: A | Discharge status: Home / Self Care | Payer: PRIVATE HEALTH INSURANCE | Source: Ambulatory Visit | Attending: Family Medicine | Admitting: Family Medicine

## 2017-06-08 DIAGNOSIS — Z780 Asymptomatic menopausal state: Secondary | ICD-10-CM

## 2017-06-08 DIAGNOSIS — Z1231 Encounter for screening mammogram for malignant neoplasm of breast: Secondary | ICD-10-CM | POA: Diagnosis not present

## 2017-06-08 DIAGNOSIS — M8589 Other specified disorders of bone density and structure, multiple sites: Secondary | ICD-10-CM | POA: Diagnosis not present

## 2017-06-08 DIAGNOSIS — Z139 Encounter for screening, unspecified: Secondary | ICD-10-CM

## 2017-07-01 DIAGNOSIS — N390 Urinary tract infection, site not specified: Secondary | ICD-10-CM | POA: Diagnosis not present

## 2017-07-01 DIAGNOSIS — Z6823 Body mass index (BMI) 23.0-23.9, adult: Secondary | ICD-10-CM | POA: Diagnosis not present

## 2017-07-01 DIAGNOSIS — N3000 Acute cystitis without hematuria: Secondary | ICD-10-CM | POA: Diagnosis not present

## 2017-07-13 DIAGNOSIS — Z1339 Encounter for screening examination for other mental health and behavioral disorders: Secondary | ICD-10-CM | POA: Diagnosis not present

## 2017-07-13 DIAGNOSIS — Z124 Encounter for screening for malignant neoplasm of cervix: Secondary | ICD-10-CM | POA: Diagnosis not present

## 2017-07-13 DIAGNOSIS — Z01419 Encounter for gynecological examination (general) (routine) without abnormal findings: Secondary | ICD-10-CM | POA: Diagnosis not present

## 2017-09-05 DIAGNOSIS — Z136 Encounter for screening for cardiovascular disorders: Secondary | ICD-10-CM | POA: Diagnosis not present

## 2017-09-05 DIAGNOSIS — Z9181 History of falling: Secondary | ICD-10-CM | POA: Diagnosis not present

## 2017-09-05 DIAGNOSIS — Z0000 Encounter for general adult medical examination without abnormal findings: Secondary | ICD-10-CM | POA: Diagnosis not present

## 2017-09-05 DIAGNOSIS — Z1331 Encounter for screening for depression: Secondary | ICD-10-CM | POA: Diagnosis not present

## 2017-09-05 DIAGNOSIS — E785 Hyperlipidemia, unspecified: Secondary | ICD-10-CM | POA: Diagnosis not present

## 2017-09-05 DIAGNOSIS — Z139 Encounter for screening, unspecified: Secondary | ICD-10-CM | POA: Diagnosis not present

## 2017-09-13 DIAGNOSIS — R740 Nonspecific elevation of levels of transaminase and lactic acid dehydrogenase [LDH]: Secondary | ICD-10-CM | POA: Diagnosis not present

## 2017-09-13 DIAGNOSIS — Z6824 Body mass index (BMI) 24.0-24.9, adult: Secondary | ICD-10-CM | POA: Diagnosis not present

## 2017-09-13 DIAGNOSIS — Z1331 Encounter for screening for depression: Secondary | ICD-10-CM | POA: Diagnosis not present

## 2017-09-13 DIAGNOSIS — E7800 Pure hypercholesterolemia, unspecified: Secondary | ICD-10-CM | POA: Diagnosis not present

## 2017-09-24 NOTE — Progress Notes (Signed)
Cardiology Office Note   Date:  09/26/2017   ID:  Brandy Haley, DOB December 07, 1955, MRN [id number]  PCP:  Isaias Sakai, DO  Cardiologist:   No primary care provider on file. Referring:  Isaias Sakai, DO  Chief Complaint  Patient presents with  . Chest Pain      History of Present Illness: Brandy Haley is a 62 y.o. female who is referred by Isaias Sakai, DO for evaluation of dyslipidemia.  The patient has no past cardiac history.  However, she does have a significant family history for early coronary artery disease.  She has severe dyslipidemia with an LDL of 252.  She herself is active.  She takes care of her home and helps with several grandchildren.  She does not have shortness of breath, PND or orthopnea.  She has no palpitations, presyncope or syncope.  Of note she does have chest discomfort.  This happens at night when she lies down.  She may have to sit up and drink some water.  It starts in her chest and goes up to her jaw.  Past Medical History:  Diagnosis Date  . Diverticulitis    Ruptured abscess  . GERD (gastroesophageal reflux disease)   . GI bleed   . Hypercholesteremia   . Hypertension     Past Surgical History:  Procedure Laterality Date  . APPENDECTOMY    . CHOLECYSTECTOMY    . COLONOSCOPY WITH PROPOFOL N/A 09/09/2015   Procedure: COLONOSCOPY WITH PROPOFOL;  Surgeon: Lollie Sails, MD;  Location: [facility] ENDOSCOPY;  Service: Endoscopy;  Laterality: N/A;  . COLOSTOMY    . COLOSTOMY REVERSAL    . ESOPHAGOGASTRODUODENOSCOPY (EGD) WITH PROPOFOL N/A 09/09/2015   Procedure: ESOPHAGOGASTRODUODENOSCOPY (EGD) WITH PROPOFOL;  Surgeon: Lollie Sails, MD;  Location: [facility] ENDOSCOPY;  Service: Endoscopy;  Laterality: N/A;  . Orthopedic surgeries     [address] [place] of her house and had multiple fractures repaired.      Current Outpatient Medications  Medication Sig Dispense Refill  . benazepril (LOTENSIN) 10 MG tablet Take 10 mg  by mouth daily. for high blood pressure  1  . Calcium Carbonate-Vitamin D (CALCIUM 600+D) 600-400 MG-UNIT tablet Take 1 tablet by mouth daily.    . traMADol (ULTRAM) 50 MG tablet Take 1 tablet (50 mg total) by mouth every 6 (six) hours as needed. 30 tablet 0  . lisinopril (PRINIVIL,ZESTRIL) 2.5 MG tablet Take 1 tablet (2.5 mg total) by mouth daily. 30 tablet 0   No current facility-administered medications for this visit.     Allergies:   Ciprofloxacin    Social History:  The patient  reports that she has never smoked. She has never used smokeless tobacco. She reports that she does not drink alcohol or use drugs.   Family History:  The patient's family history includes CAD (age of onset: 17) in her mother; Cancer in her mother; Stroke in her father.    ROS:  Please see the history of present illness.   Otherwise, review of systems are positive for none.   All other systems are reviewed and negative.    PHYSICAL EXAM: VS:  BP (!) 146/94   Pulse 83   Ht 5\' 2"  (1.575 m)   Wt 133 lb (60.3 kg)   BMI 24.33 kg/m  , BMI Body mass index is 24.33 kg/m. GENERAL:  Well appearing HEENT:  Pupils equal round and reactive, fundi not visualized, oral mucosa unremarkable NECK:  No jugular venous  distention, waveform within normal limits, carotid upstroke brisk and symmetric, no bruits, no thyromegaly LYMPHATICS:  No cervical, inguinal adenopathy LUNGS:  Clear to auscultation bilaterally BACK:  No CVA tenderness CHEST:  Unremarkable HEART:  PMI not displaced or sustained,S1 and S2 within normal limits, no S3, no S4, no clicks, no rubs, no murmurs ABD:  Flat, positive bowel sounds normal in frequency in pitch, no bruits, no rebound, no guarding, no midline pulsatile mass, no hepatomegaly, no splenomegaly EXT:  2 plus pulses throughout, no edema, no cyanosis no clubbing SKIN:  No rashes no nodules NEURO:  Cranial nerves II through XII grossly intact, motor grossly intact throughout PSYCH:   Cognitively intact, oriented to person place and time    EKG:  EKG is ordered today. The ekg ordered today demonstrates sinus rhythm, rate 83, axis within normal limits, short PR interval, no acute ST-T wave changes.   Recent Labs: No results found for requested labs within last 8760 hours.    Lipid Panel No results found for: CHOL, TRIG, HDL, CHOLHDL, VLDL, LDLCALC, LDLDIRECT    Wt Readings from Last 3 Encounters:  09/26/17 133 lb (60.3 kg)  10/27/15 130 lb (59 kg)  10/20/15 131 lb (59.4 kg)      Other studies Reviewed: Additional studies/ records that were reviewed today include: Labs. Review of the above records demonstrates:  Please see elsewhere in the note.     ASSESSMENT AND PLAN:  CHEST PAIN: This is somewhat atypical.  However, given the family history and the dyslipidemia she needs screening. I will bring the patient back for a POET (Plain Old Exercise Test). This will allow me to screen for obstructive coronary disease, risk stratify and very importantly provide a prescription for exercise.  DYSLIPIDEMIA: Her LDL was 252.  HDL was 58.  Triglycerides 120.  Total cholesterol 334.  She has been absolutely intolerant of statins.  This has been well documented.  Her primary provider suggested PCSK9 inhibitor and I absolutely agree.  Had a long conversation with her about this.  She is going to go back to her primary provider to start this.  We would be happy to participate if there are any questions or problems getting this approved from her insurance.   Current medicines are reviewed at length with the patient today.  The patient does not have concerns regarding medicines.  The following changes have been made:  As above  Labs/ tests ordered today include:   Orders Placed This Encounter  Procedures  . EXERCISE TOLERANCE TEST (ETT)  . EKG 12-Lead     Disposition:   FU with me as needed.     Signed, Minus Breeding, MD  09/26/2017 10:03 AM    [facility]

## 2017-09-26 ENCOUNTER — Encounter: Payer: Self-pay | Admitting: Cardiology

## 2017-09-26 ENCOUNTER — Ambulatory Visit: Payer: Medicare PPO | Admitting: Cardiology

## 2017-09-26 VITALS — BP 146/94 | HR 83 | Ht 62.0 in | Wt 133.0 lb

## 2017-09-26 DIAGNOSIS — E785 Hyperlipidemia, unspecified: Secondary | ICD-10-CM

## 2017-09-26 DIAGNOSIS — R079 Chest pain, unspecified: Secondary | ICD-10-CM

## 2017-09-26 NOTE — Patient Instructions (Signed)
Medication Instructions:  Continue current medications  If you need a refill on your cardiac medications before your next appointment, please call your pharmacy.  Labwork: None Ordered   Testing/Procedures: Your physician has requested that you have an exercise tolerance test. For further information please visit [email or web address]. Please also follow instruction sheet, as given.   Follow-Up: Your physician wants you to follow-up in: As Needed.     Thank you for choosing [facility]!!       

## 2017-09-29 ENCOUNTER — Telehealth (HOSPITAL_COMMUNITY): Payer: Self-pay

## 2017-09-29 NOTE — Telephone Encounter (Signed)
Encounter complete. 

## 2017-10-04 ENCOUNTER — Encounter (HOSPITAL_COMMUNITY): Payer: Self-pay | Admitting: *Deleted

## 2017-10-04 ENCOUNTER — Ambulatory Visit (HOSPITAL_COMMUNITY)
Admission: RE | Admit: 2017-10-04 | Discharge: 2017-10-04 | Disposition: A | Discharge status: Home / Self Care | Payer: Medicare PPO | Source: Ambulatory Visit | Attending: Cardiology | Admitting: Cardiology

## 2017-10-04 DIAGNOSIS — R079 Chest pain, unspecified: Secondary | ICD-10-CM | POA: Insufficient documentation

## 2017-10-04 DIAGNOSIS — E785 Hyperlipidemia, unspecified: Secondary | ICD-10-CM | POA: Insufficient documentation

## 2017-10-04 LAB — EXERCISE TOLERANCE TEST
CSEPED: 9 min
CSEPHR: 101 %
Estimated workload: 10.1 METS
Exercise duration (sec): 0 s
MPHR: 157 {beats}/min
Peak HR: 160 {beats}/min
RPE: 17
Rest HR: 82 {beats}/min

## 2017-10-04 NOTE — Progress Notes (Signed)
ETT shown to Dr Percival Spanish and he said it was ok to let pt leave.

## 2017-11-10 DIAGNOSIS — Z23 Encounter for immunization: Secondary | ICD-10-CM | POA: Diagnosis not present

## 2017-11-10 DIAGNOSIS — E7801 Familial hypercholesterolemia: Secondary | ICD-10-CM | POA: Diagnosis not present

## 2017-11-10 DIAGNOSIS — Z6824 Body mass index (BMI) 24.0-24.9, adult: Secondary | ICD-10-CM | POA: Diagnosis not present

## 2017-12-22 DIAGNOSIS — J111 Influenza due to unidentified influenza virus with other respiratory manifestations: Secondary | ICD-10-CM | POA: Diagnosis not present

## 2017-12-22 DIAGNOSIS — Z6824 Body mass index (BMI) 24.0-24.9, adult: Secondary | ICD-10-CM | POA: Diagnosis not present

## 2018-02-01 DIAGNOSIS — E7801 Familial hypercholesterolemia: Secondary | ICD-10-CM | POA: Diagnosis not present

## 2018-03-13 DIAGNOSIS — J069 Acute upper respiratory infection, unspecified: Secondary | ICD-10-CM | POA: Diagnosis not present

## 2018-03-13 DIAGNOSIS — Z6824 Body mass index (BMI) 24.0-24.9, adult: Secondary | ICD-10-CM | POA: Diagnosis not present

## 2018-06-16 DIAGNOSIS — R3912 Poor urinary stream: Secondary | ICD-10-CM | POA: Diagnosis not present

## 2018-06-16 DIAGNOSIS — Z6824 Body mass index (BMI) 24.0-24.9, adult: Secondary | ICD-10-CM | POA: Diagnosis not present

## 2018-06-16 DIAGNOSIS — N390 Urinary tract infection, site not specified: Secondary | ICD-10-CM | POA: Diagnosis not present

## 2018-06-20 DIAGNOSIS — M545 Low back pain: Secondary | ICD-10-CM | POA: Diagnosis not present

## 2018-06-20 DIAGNOSIS — M62830 Muscle spasm of back: Secondary | ICD-10-CM | POA: Diagnosis not present

## 2018-06-23 DIAGNOSIS — R5383 Other fatigue: Secondary | ICD-10-CM | POA: Diagnosis not present

## 2018-06-23 DIAGNOSIS — M545 Low back pain: Secondary | ICD-10-CM | POA: Diagnosis not present

## 2018-06-23 DIAGNOSIS — R300 Dysuria: Secondary | ICD-10-CM | POA: Diagnosis not present

## 2018-06-23 DIAGNOSIS — R5381 Other malaise: Secondary | ICD-10-CM | POA: Diagnosis not present

## 2019-01-08 ENCOUNTER — Emergency Department: Payer: Medicare PPO

## 2019-01-08 ENCOUNTER — Other Ambulatory Visit: Payer: Self-pay

## 2019-01-08 ENCOUNTER — Encounter: Payer: Self-pay | Admitting: Intensive Care

## 2019-01-08 ENCOUNTER — Emergency Department
Admission: EM | Admit: 2019-01-08 | Discharge: 2019-01-08 | Disposition: A | Discharge status: Home / Self Care | Payer: Medicare PPO | Attending: Emergency Medicine | Admitting: Emergency Medicine

## 2019-01-08 DIAGNOSIS — M79642 Pain in left hand: Secondary | ICD-10-CM | POA: Insufficient documentation

## 2019-01-08 DIAGNOSIS — I7300 Raynaud's syndrome without gangrene: Secondary | ICD-10-CM | POA: Diagnosis not present

## 2019-01-08 DIAGNOSIS — R911 Solitary pulmonary nodule: Secondary | ICD-10-CM | POA: Diagnosis not present

## 2019-01-08 DIAGNOSIS — R079 Chest pain, unspecified: Secondary | ICD-10-CM | POA: Diagnosis not present

## 2019-01-08 DIAGNOSIS — Z79899 Other long term (current) drug therapy: Secondary | ICD-10-CM | POA: Diagnosis not present

## 2019-01-08 DIAGNOSIS — I10 Essential (primary) hypertension: Secondary | ICD-10-CM | POA: Diagnosis not present

## 2019-01-08 LAB — BASIC METABOLIC PANEL
Anion gap: 10 (ref 5–15)
BUN: 10 mg/dL (ref 8–23)
CO2: 24 mmol/L (ref 22–32)
Calcium: 9 mg/dL (ref 8.9–10.3)
Chloride: 104 mmol/L (ref 98–111)
Creatinine, Ser: 0.53 mg/dL (ref 0.44–1.00)
GFR calc Af Amer: >60 mL/min (ref >60)
GFR calc non Af Amer: >60 mL/min (ref >60)
Glucose, Bld: 92 mg/dL (ref 70–99)
Potassium: 3.9 mmol/L (ref 3.5–5.1)
Sodium: 138 mmol/L (ref 135–145)

## 2019-01-08 LAB — TROPONIN I (HIGH SENSITIVITY): Troponin I (High Sensitivity): 3 ng/L (ref <18)

## 2019-01-08 NOTE — ED Provider Notes (Signed)
[facility] Emergency Department Provider Note   ____________________________________________    I have reviewed the triage vital signs and the nursing notes.   HISTORY  Chief Complaint Chest Pain and Hand Pain (left)     HPI Brandy Haley is a 63 y.o. female with history as noted below who presents today with primary complaint of left hand pain.  She reports for several months she has been having issues with her hands becoming very cold and changing colors including white and blue.  More recently she has developed pain and swelling particularly at night of her left wrist, currently it is feeling much better.  She went to urgent care and mentioned that she had had some chest discomfort at some point in the last few days and they sent her to the emergency department.  Denies fevers or chills.  No cough shortness of breath.  Past Medical History:  Diagnosis Date  . Diverticulitis    Ruptured abscess  . GERD (gastroesophageal reflux disease)   . GI bleed   . Hypercholesteremia   . Hypertension     Patient Active Problem List   Diagnosis Date Noted  . Acute colitis 05/22/2015  . Diarrhea 05/22/2015  . Hypotension 05/22/2015  . Acute posthemorrhagic anemia 05/22/2015  . Hyperglycemia 05/22/2015  . Rectal bleed 05/19/2015    Past Surgical History:  Procedure Laterality Date  . APPENDECTOMY    . CHOLECYSTECTOMY    . COLONOSCOPY WITH PROPOFOL N/A 09/09/2015   Procedure: COLONOSCOPY WITH PROPOFOL;  Surgeon: Lollie Sails, MD;  Location: [facility] ENDOSCOPY;  Service: Endoscopy;  Laterality: N/A;  . COLOSTOMY    . COLOSTOMY REVERSAL    . ESOPHAGOGASTRODUODENOSCOPY (EGD) WITH PROPOFOL N/A 09/09/2015   Procedure: ESOPHAGOGASTRODUODENOSCOPY (EGD) WITH PROPOFOL;  Surgeon: Lollie Sails, MD;  Location: [facility] ENDOSCOPY;  Service: Endoscopy;  Laterality: N/A;  . Orthopedic surgeries     [address] [place] house and had multiple fractures repaired.      Prior to Admission medications   Medication Sig Start Date End Date Taking? Authorizing Provider  benazepril (LOTENSIN) 10 MG tablet Take 10 mg by mouth daily. for high blood pressure 09/13/17   [provider]  Calcium Carbonate-Vitamin D (CALCIUM 600+D) 600-400 MG-UNIT tablet Take 1 tablet by mouth daily.    [provider]  lisinopril (PRINIVIL,ZESTRIL) 2.5 MG tablet Take 1 tablet (2.5 mg total) by mouth daily. 10/20/15 11/19/15  Daymon Larsen, MD  traMADol (ULTRAM) 50 MG tablet Take 1 tablet (50 mg total) by mouth every 6 (six) hours as needed. 10/27/15   Daymon Larsen, MD     Allergies Ciprofloxacin  Family History  Problem Relation Age of Onset  . Cancer Mother        Throat and neck  died age 107  . CAD Mother 34       Stents  . Stroke Father        Died age 50  . Breast cancer Neg Hx     Social History Social History   Tobacco Use  . Smoking status: Never Smoker  . Smokeless tobacco: Never Used  Substance Use Topics  . Alcohol use: No  . Drug use: No    Review of Systems  Constitutional: No fever/chills Eyes: No visual changes.  ENT: No sore throat. Cardiovascular: As above Respiratory: Denies shortness of breath. Gastrointestinal: No abdominal pain.  No nausea, no vomiting.   Genitourinary: Negative for dysuria. Musculoskeletal: As above Skin:  Negative for rash. Neurological: Negative for headaches   ____________________________________________   PHYSICAL EXAM:  VITAL SIGNS: ED Triage Vitals  Enc Vitals Group     BP 01/08/19 1135 138/74     Pulse Rate 01/08/19 1135 92     Resp 01/08/19 1135 16     Temp 01/08/19 1135 98.9 F (37.2 C)     Temp Source 01/08/19 1135 Oral     SpO2 01/08/19 1135 100 %     Weight 01/08/19 1136 58.5 kg (129 lb)     Height 01/08/19 1136 1.575 m (5\' 2" )     Head Circumference --      Peak Flow --      Pain Score 01/08/19 1136 0     Pain Loc --      Pain Edu? --      Excl. in [place]? --      Constitutional: Alert and oriented. No acute distress. Pleasant and interactive  Nose: No congestion/rhinnorhea. Mouth/Throat: Mucous membranes are moist.    Cardiovascular: Normal rate, regular rhythm.  Good peripheral circulation. Respiratory: Normal respiratory effort.  No retractions. Lungs CTAB.   Musculoskeletal: Left wrist, hand exam is normal, no erythema evidence of infection or significant swelling at this time.  Good cap refill.  Normal pulses Neurologic:  Normal speech and language. No gross focal neurologic deficits are appreciated.  Skin:  Skin is warm, dry and intact. No rash noted. Psychiatric: Mood and affect are normal. Speech and behavior are normal.  ____________________________________________   LABS (all labs ordered are listed, but only abnormal results are displayed)  Labs Reviewed  BASIC METABOLIC PANEL  TROPONIN I (HIGH SENSITIVITY)  TROPONIN I (HIGH SENSITIVITY)   ____________________________________________  EKG  ED ECG REPORT I, Lavonia Drafts, the attending physician, personally viewed and interpreted this ECG.  Date: 01/08/2019  Rhythm: normal sinus rhythm QRS Axis: normal Intervals: normal ST/T Wave abnormalities: normal Narrative Interpretation: no evidence of acute ischemia  ____________________________________________  RADIOLOGY  Chest x-ray possible nodule CT scan unremarkable ____________________________________________   PROCEDURES  Procedure(s) performed: No  Procedures   Critical Care performed: No ____________________________________________   INITIAL IMPRESSION / ASSESSMENT AND PLAN / ED COURSE  Pertinent labs & imaging results that were available during my care of the patient were reviewed by me and considered in my medical decision making (see chart for details).  Patient well-appearing in no acute distress, vital signs quite reassuring.  Exam is overall unremarkable.  Suspect rheumatologic disorder given  recent development of likely Raynaud's syndrome now with joint swelling intermittently.  No chest pain today, EKG is reassuring.  Chest x-ray demonstrates possible nodule, will get CT scan to evaluate this further.  CT is reassuring  Lab work unremarkable appropriate for discharge with follow-up with rheumatology    ____________________________________________   FINAL CLINICAL IMPRESSION(S) / ED DIAGNOSES  Final diagnoses:  Raynaud's disease without gangrene        Note:  This document was prepared using Dragon voice recognition software and may include unintentional dictation errors.   Lavonia Drafts, MD 01/08/19 1351

## 2019-01-08 NOTE — ED Triage Notes (Signed)
Patient reports she has been having intermittent chest pain every few days for months now but now more frequent. Patient reports she mainly came today due to her left arm/hand swelling intermittently starting 01/05/19

## 2019-01-08 NOTE — ED Notes (Signed)
Pt resting in bed with no complaints at this time. Call light in reach, bed locked and low.

## 2019-01-17 DIAGNOSIS — M7989 Other specified soft tissue disorders: Secondary | ICD-10-CM | POA: Diagnosis not present

## 2019-01-17 DIAGNOSIS — M79642 Pain in left hand: Secondary | ICD-10-CM | POA: Diagnosis not present

## 2019-01-17 DIAGNOSIS — M19042 Primary osteoarthritis, left hand: Secondary | ICD-10-CM | POA: Diagnosis not present

## 2019-01-17 DIAGNOSIS — I7300 Raynaud's syndrome without gangrene: Secondary | ICD-10-CM | POA: Diagnosis not present

## 2019-01-17 DIAGNOSIS — M79641 Pain in right hand: Secondary | ICD-10-CM | POA: Diagnosis not present

## 2019-01-17 DIAGNOSIS — M19041 Primary osteoarthritis, right hand: Secondary | ICD-10-CM | POA: Diagnosis not present

## 2019-01-31 DIAGNOSIS — M19042 Primary osteoarthritis, left hand: Secondary | ICD-10-CM | POA: Diagnosis not present

## 2019-01-31 DIAGNOSIS — I7300 Raynaud's syndrome without gangrene: Secondary | ICD-10-CM | POA: Diagnosis not present

## 2019-01-31 DIAGNOSIS — M19041 Primary osteoarthritis, right hand: Secondary | ICD-10-CM | POA: Diagnosis not present

## 2019-09-07 ENCOUNTER — Other Ambulatory Visit: Payer: Self-pay | Admitting: Internal Medicine

## 2019-09-07 DIAGNOSIS — Z1231 Encounter for screening mammogram for malignant neoplasm of breast: Secondary | ICD-10-CM

## 2019-09-10 ENCOUNTER — Other Ambulatory Visit: Payer: Self-pay | Admitting: Family Medicine

## 2019-09-10 DIAGNOSIS — M5030 Other cervical disc degeneration, unspecified cervical region: Secondary | ICD-10-CM

## 2019-09-17 ENCOUNTER — Other Ambulatory Visit: Payer: Self-pay

## 2019-09-17 ENCOUNTER — Ambulatory Visit
Admission: RE | Admit: 2019-09-17 | Discharge: 2019-09-17 | Disposition: A | Discharge status: Home / Self Care | Payer: Medicare Other | Source: Ambulatory Visit | Attending: Internal Medicine | Admitting: Internal Medicine

## 2019-09-17 DIAGNOSIS — Z1231 Encounter for screening mammogram for malignant neoplasm of breast: Secondary | ICD-10-CM

## 2019-09-27 ENCOUNTER — Ambulatory Visit
Admission: RE | Admit: 2019-09-27 | Discharge: 2019-09-27 | Disposition: A | Discharge status: Home / Self Care | Payer: Medicare Other | Source: Ambulatory Visit | Attending: Family Medicine | Admitting: Family Medicine

## 2019-09-27 ENCOUNTER — Other Ambulatory Visit: Payer: Self-pay

## 2019-09-27 DIAGNOSIS — M5030 Other cervical disc degeneration, unspecified cervical region: Secondary | ICD-10-CM | POA: Diagnosis present

## 2020-07-16 ENCOUNTER — Emergency Department: Payer: Medicare Other

## 2020-07-16 ENCOUNTER — Other Ambulatory Visit: Payer: Self-pay

## 2020-07-16 ENCOUNTER — Inpatient Hospital Stay
Admission: EM | Admit: 2020-07-16 | Discharge: 2020-07-18 | DRG: 872 | Disposition: A | Discharge status: Home / Self Care | Payer: Medicare Other | Attending: Family Medicine | Admitting: Family Medicine

## 2020-07-16 DIAGNOSIS — E785 Hyperlipidemia, unspecified: Secondary | ICD-10-CM | POA: Diagnosis present

## 2020-07-16 DIAGNOSIS — R739 Hyperglycemia, unspecified: Secondary | ICD-10-CM | POA: Diagnosis present

## 2020-07-16 DIAGNOSIS — I700 Atherosclerosis of aorta: Secondary | ICD-10-CM | POA: Diagnosis present

## 2020-07-16 DIAGNOSIS — Z881 Allergy status to other antibiotic agents status: Secondary | ICD-10-CM

## 2020-07-16 DIAGNOSIS — R197 Diarrhea, unspecified: Secondary | ICD-10-CM | POA: Diagnosis present

## 2020-07-16 DIAGNOSIS — D708 Other neutropenia: Secondary | ICD-10-CM

## 2020-07-16 DIAGNOSIS — Z20822 Contact with and (suspected) exposure to covid-19: Secondary | ICD-10-CM | POA: Diagnosis present

## 2020-07-16 DIAGNOSIS — Z8249 Family history of ischemic heart disease and other diseases of the circulatory system: Secondary | ICD-10-CM

## 2020-07-16 DIAGNOSIS — D61818 Other pancytopenia: Secondary | ICD-10-CM | POA: Diagnosis present

## 2020-07-16 DIAGNOSIS — Z28310 Unvaccinated for covid-19: Secondary | ICD-10-CM

## 2020-07-16 DIAGNOSIS — K219 Gastro-esophageal reflux disease without esophagitis: Secondary | ICD-10-CM | POA: Diagnosis present

## 2020-07-16 DIAGNOSIS — A419 Sepsis, unspecified organism: Principal | ICD-10-CM | POA: Diagnosis present

## 2020-07-16 DIAGNOSIS — D696 Thrombocytopenia, unspecified: Secondary | ICD-10-CM | POA: Diagnosis present

## 2020-07-16 DIAGNOSIS — K529 Noninfective gastroenteritis and colitis, unspecified: Secondary | ICD-10-CM | POA: Diagnosis present

## 2020-07-16 DIAGNOSIS — I7300 Raynaud's syndrome without gangrene: Secondary | ICD-10-CM | POA: Diagnosis present

## 2020-07-16 DIAGNOSIS — R112 Nausea with vomiting, unspecified: Secondary | ICD-10-CM | POA: Diagnosis present

## 2020-07-16 DIAGNOSIS — L409 Psoriasis, unspecified: Secondary | ICD-10-CM | POA: Diagnosis present

## 2020-07-16 DIAGNOSIS — Z79899 Other long term (current) drug therapy: Secondary | ICD-10-CM

## 2020-07-16 DIAGNOSIS — I10 Essential (primary) hypertension: Secondary | ICD-10-CM | POA: Diagnosis present

## 2020-07-16 DIAGNOSIS — L4050 Arthropathic psoriasis, unspecified: Secondary | ICD-10-CM | POA: Diagnosis present

## 2020-07-16 DIAGNOSIS — N179 Acute kidney failure, unspecified: Secondary | ICD-10-CM | POA: Diagnosis present

## 2020-07-16 DIAGNOSIS — N3000 Acute cystitis without hematuria: Secondary | ICD-10-CM | POA: Diagnosis present

## 2020-07-16 LAB — CBC WITH DIFFERENTIAL/PLATELET
Abs Immature Granulocytes: 0.01 10*3/uL (ref 0.00–0.07)
Basophils Absolute: 0 10*3/uL (ref 0.0–0.1)
Basophils Relative: 1 %
Eosinophils Absolute: 0 10*3/uL (ref 0.0–0.5)
Eosinophils Relative: 0 %
HCT: 40.9 % (ref 36.0–46.0)
Hemoglobin: 14.4 g/dL (ref 12.0–15.0)
Immature Granulocytes: 0 %
Lymphocytes Relative: 23 %
Lymphs Abs: 0.6 10*3/uL — ABNORMAL LOW (ref 0.7–4.0)
MCH: 33.5 pg (ref 26.0–34.0)
MCHC: 35.2 g/dL (ref 30.0–36.0)
MCV: 95.1 fL (ref 80.0–100.0)
Monocytes Absolute: 0.5 10*3/uL (ref 0.1–1.0)
Monocytes Relative: 18 %
Neutro Abs: 1.5 10*3/uL — ABNORMAL LOW (ref 1.7–7.7)
Neutrophils Relative %: 58 %
Platelets: 95 10*3/uL — ABNORMAL LOW (ref 150–400)
RBC: 4.3 MIL/uL (ref 3.87–5.11)
RDW: 14.1 % (ref 11.5–15.5)
Smear Review: NORMAL
WBC: 2.6 10*3/uL — ABNORMAL LOW (ref 4.0–10.5)
nRBC: 0 % (ref 0.0–0.2)

## 2020-07-16 LAB — URINALYSIS, COMPLETE (UACMP) WITH MICROSCOPIC
Bilirubin Urine: NEGATIVE
Glucose, UA: NEGATIVE mg/dL
Ketones, ur: NEGATIVE mg/dL
Leukocytes,Ua: NEGATIVE
Nitrite: POSITIVE — AB
Protein, ur: NEGATIVE mg/dL
Specific Gravity, Urine: 1.025 (ref 1.005–1.030)
pH: 5 (ref 5.0–8.0)

## 2020-07-16 LAB — RESP PANEL BY RT-PCR (FLU A&B, COVID) ARPGX2
Influenza A by PCR: NEGATIVE
Influenza B by PCR: NEGATIVE
SARS Coronavirus 2 by RT PCR: NEGATIVE

## 2020-07-16 LAB — COMPREHENSIVE METABOLIC PANEL
ALT: 79 U/L — ABNORMAL HIGH (ref 0–44)
AST: 91 U/L — ABNORMAL HIGH (ref 15–41)
Albumin: 3.9 g/dL (ref 3.5–5.0)
Alkaline Phosphatase: 111 U/L (ref 38–126)
Anion gap: 11 (ref 5–15)
BUN: 32 mg/dL — ABNORMAL HIGH (ref 8–23)
CO2: 23 mmol/L (ref 22–32)
Calcium: 9.3 mg/dL (ref 8.9–10.3)
Chloride: 101 mmol/L (ref 98–111)
Creatinine, Ser: 1.32 mg/dL — ABNORMAL HIGH (ref 0.44–1.00)
GFR, Estimated: 45 mL/min — ABNORMAL LOW (ref >60)
Glucose, Bld: 123 mg/dL — ABNORMAL HIGH (ref 70–99)
Potassium: 3.8 mmol/L (ref 3.5–5.1)
Sodium: 135 mmol/L (ref 135–145)
Total Bilirubin: 0.8 mg/dL (ref 0.3–1.2)
Total Protein: 7.1 g/dL (ref 6.5–8.1)

## 2020-07-16 LAB — PROTIME-INR
INR: 1 (ref 0.8–1.2)
Prothrombin Time: 13.5 seconds (ref 11.4–15.2)

## 2020-07-16 LAB — LACTIC ACID, PLASMA: Lactic Acid, Venous: 1.8 mmol/L (ref 0.5–1.9)

## 2020-07-16 LAB — LIPASE, BLOOD: Lipase: 41 U/L (ref 11–51)

## 2020-07-16 MED ORDER — ACETAMINOPHEN 650 MG RE SUPP: 650.0000 mg | Freq: Four times a day (QID) | RECTAL | Status: DC | PRN

## 2020-07-16 MED ORDER — SODIUM CHLORIDE 0.9 % IV SOLN
2.0000 g | Freq: Once | INTRAVENOUS | Status: AC
  Administered 2020-07-16: 2 g via INTRAVENOUS
  Filled 2020-07-16: qty 2

## 2020-07-16 MED ORDER — SODIUM CHLORIDE 0.9 % IV SOLN: INTRAVENOUS | Status: AC

## 2020-07-16 MED ORDER — ONDANSETRON HCL 4 MG/2ML IJ SOLN
4.0000 mg | Freq: Four times a day (QID) | INTRAMUSCULAR | Status: DC | PRN
  Administered 2020-07-17: 4 mg via INTRAVENOUS
  Filled 2020-07-16: qty 2

## 2020-07-16 MED ORDER — TRAMADOL HCL 50 MG PO TABS
50.0000 mg | ORAL_TABLET | Freq: Four times a day (QID) | ORAL | Status: DC | PRN
  Administered 2020-07-16 – 2020-07-17 (×3): 50 mg via ORAL
  Filled 2020-07-16 (×3): qty 1

## 2020-07-16 MED ORDER — ACETAMINOPHEN 325 MG PO TABS
650.0000 mg | ORAL_TABLET | Freq: Four times a day (QID) | ORAL | Status: DC | PRN
  Administered 2020-07-17: 650 mg via ORAL
  Filled 2020-07-16: qty 2

## 2020-07-16 MED ORDER — LACTATED RINGERS IV BOLUS (SEPSIS)
500.0000 mL | Freq: Once | INTRAVENOUS | Status: AC
  Administered 2020-07-16: 500 mL via INTRAVENOUS

## 2020-07-16 MED ORDER — NYSTATIN 100000 UNIT/ML MT SUSP
5.0000 mL | Freq: Four times a day (QID) | OROMUCOSAL | Status: DC
  Administered 2020-07-16 – 2020-07-18 (×7): 500000 [IU] via OROMUCOSAL
  Filled 2020-07-16 (×9): qty 5

## 2020-07-16 MED ORDER — IOHEXOL 300 MG/ML  SOLN
100.0000 mL | Freq: Once | INTRAMUSCULAR | Status: DC | PRN
  Filled 2020-07-16: qty 100

## 2020-07-16 MED ORDER — LACTATED RINGERS IV BOLUS (SEPSIS)
1000.0000 mL | Freq: Once | INTRAVENOUS | Status: AC
  Administered 2020-07-16: 1000 mL via INTRAVENOUS

## 2020-07-16 MED ORDER — HYDRALAZINE HCL 25 MG PO TABS: 25.0000 mg | ORAL_TABLET | Freq: Three times a day (TID) | ORAL | Status: DC | PRN

## 2020-07-16 MED ORDER — ENOXAPARIN SODIUM 40 MG/0.4ML IJ SOSY
40.0000 mg | PREFILLED_SYRINGE | INTRAMUSCULAR | Status: DC
  Administered 2020-07-16: 40 mg via SUBCUTANEOUS
  Filled 2020-07-16: qty 0.4

## 2020-07-16 MED ORDER — LACTATED RINGERS IV BOLUS (SEPSIS)
250.0000 mL | Freq: Once | INTRAVENOUS | Status: AC
  Administered 2020-07-16: 250 mL via INTRAVENOUS

## 2020-07-16 MED ORDER — METRONIDAZOLE 500 MG/100ML IV SOLN
500.0000 mg | Freq: Once | INTRAVENOUS | Status: AC
  Administered 2020-07-16: 500 mg via INTRAVENOUS
  Filled 2020-07-16: qty 100

## 2020-07-16 MED ORDER — ONDANSETRON HCL 4 MG PO TABS
4.0000 mg | ORAL_TABLET | Freq: Four times a day (QID) | ORAL | Status: DC | PRN
Start: 2020-07-16 — End: 2020-07-18

## 2020-07-16 MED ORDER — IOHEXOL 300 MG/ML  SOLN
75.0000 mL | Freq: Once | INTRAMUSCULAR | Status: AC | PRN
  Administered 2020-07-16: 75 mL via INTRAVENOUS
  Filled 2020-07-16: qty 75

## 2020-07-16 NOTE — H&P (Addendum)
History and Physical   Brandy Haley HKV:[id number] DOB: [birth year] DOA: 07/16/2020  PCP: Gladstone Lighter, MD  Outpatient Specialists: Dr. Ephriam Jenkins, [facility] Patient coming from: home   I have personally briefly reviewed patient's old medical records in [place].  Chief Concern: Diarrhea, abdominal pain  HPI: Brandy Haley is a 65 y.o. female with medical history significant for recent left otitis media, hypertension, GERD, hyperlipidemia, history of Raynaud's disease, diverticulosis, herpes zoster's without complication, psoriatic arthritis, psoriasis, seronegative spondyloarthropathy, on Cimzia infusion, presents to the emergency department for chief concerns of diarrhea.  She reports the diarrhea started on 07/15/2020.  She also endorses nausea and dry heaves.  She denies vomiting.  She denies blood in her stool and or melena.  She states that her stool is orange in color.  She endorses fever, T-max of 100.8 at home.  She denies sick contacts, no diet changes.  She endorses multiple antibiotic prescriptions in the last month, including upper respiratory, left ear infection, UTI.  She didn't take anything for fever.  She has only been able to keep Sprite down.  Social history: [place]. She denies tobacco use, etoh use, recreational drugs. 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   Vaccination history: She is not vaccinated for covid 19.   ROS: Constitutional: no weight change, + fever (100.8 F at home) ENT/Mouth: no sore throat, no rhinorrhea Eyes: no eye pain, no vision changes Cardiovascular: no chest pain, no dyspnea,  no edema, no palpitations Respiratory: no cough, no sputum, no wheezing Gastrointestinal: + nausea, no vomiting, + diarrhea, no constipation Genitourinary: no urinary incontinence, no dysuria, no hematuria Musculoskeletal: no arthralgias, no myalgias Skin: no skin lesions, no pruritus, Neuro: +  weakness, no loss of consciousness, no syncope Psych: no anxiety, no depression, + decrease appetite Heme/Lymph: no bruising, no bleeding  ED Course: Discussed with ED provider, patient requiring hospitalization for diarrhea concerning for infectious diarrhea.  Vitals in the emergency department was remarkable for temperature of 100.4, respiration rate of 24, heart rate of 114, initial blood pressure was 100/59 and improved to 150/74, SPO2 of 96% on room air.  She received metronidazole 500 mg IV, cefepime IV per EDP.  CT abdomen pelvis with contrast was ordered and was read as no acute abnormality.  No evidence of ascending urinary tract infection.  Aortic atherosclerosis.  Assessment/Plan  Principal Problem:   Intractable vomiting with nausea Active Problems:   Acute colitis   Diarrhea   Hyperglycemia   AKI (acute kidney injury) ([place])   Essential hypertension   Hyperlipidemia   Chronic GERD   Psoriatic arthritis (HCC)   # Diarrhea with leukopenia # Meets sepsis criteria with sinus tachycardia, fever, leukopenia, possible source of GI - Recent multiple antibiotic use, see chart review below - Initial lactic acid is 1.8 - Patient is maintaining MAP, no indication for sepsis bolus at this time - Normal saline IVF at 150 mL/h, ordered for 1 day - GI PCR, C. difficile PCR ordered - Bedside commode - CBC in the a.m.  # Acute kidney injury-suspect prerenal secondary to GI loss - Baseline serum creatinine is 0.5-0.74 from 2017-2021 - No previous CKD -Normal saline IVF at 150 mL/h, 1 day ordered - BMP in the a.m.  # Hypertension-Controlled - Holding home benazepril 10 mg daily due to acute kidney injury - Hydralazine 25 mg as needed every 8 hours for SBP greater than 170 - A.m. team  to resume antihypertensives when appropriate  # Hyperlipidemia-is admitted 10 mg daily has not been continued due to persistent nausea at this time  # Psoriatic arthritis # Seronegative  spondyloarthropathy - Follow-up with rheumatology outpatient  # CT evidence of aortic atherosclerosis-outpatient follow-up -Patient is not having chest pain and/or shortness of breath at this time, I have no clinical suspicion for ACS Chart reviewed.   06/26/2020: Left ear infection and acute bronchitis was sent home with a Z-Pak, prednisone 20 mg daily for 4 days  07/14/2020: Acute cystitis, cefdinir 300 mg twice daily, 7 days and phenazopyridine  DVT prophylaxis:  Code Status: full code  Diet: Heart healthy Family Communication: Updated spouse at bedside Disposition Plan: Pending clinical course Consults called: None at this time Admission status: MedSurg, observation, no telemetry  Past Medical History:  Diagnosis Date   Diverticulitis    Ruptured abscess   GERD (gastroesophageal reflux disease)    GI bleed    Hypercholesteremia    Hypertension    Past Surgical History:  Procedure Laterality Date   APPENDECTOMY     CHOLECYSTECTOMY     COLONOSCOPY WITH PROPOFOL N/A 09/09/2015   Procedure: COLONOSCOPY WITH PROPOFOL;  Surgeon: Lollie Sails, MD;  Location: [facility] ENDOSCOPY;  Service: Endoscopy;  Laterality: N/A;   COLOSTOMY     COLOSTOMY REVERSAL     ESOPHAGOGASTRODUODENOSCOPY (EGD) WITH PROPOFOL N/A 09/09/2015   Procedure: ESOPHAGOGASTRODUODENOSCOPY (EGD) WITH PROPOFOL;  Surgeon: Lollie Sails, MD;  Location: [facility] ENDOSCOPY;  Service: Endoscopy;  Laterality: N/A;   Orthopedic surgeries     [address] [place] and had multiple fractures repaired.    Social History:  reports that she has never smoked. She has never used smokeless tobacco. She reports that she does not drink alcohol and does not use drugs.  Allergies  Allergen Reactions   Ciprofloxacin Hives   Family History  Problem Relation Age of Onset   Cancer Mother        Throat and neck  died age 55   CAD Mother 69       Stents   Stroke Father        Died age 26   Breast cancer Neg Hx    Family  history: Family history reviewed and not pertinent  Prior to Admission medications   Medication Sig Start Date End Date Taking? Authorizing Provider  benazepril (LOTENSIN) 10 MG tablet Take 10 mg by mouth daily. for high blood pressure 09/13/17   [provider]  Calcium Carbonate-Vitamin D (CALCIUM 600+D) 600-400 MG-UNIT tablet Take 1 tablet by mouth daily.    [provider]  lisinopril (PRINIVIL,ZESTRIL) 2.5 MG tablet Take 1 tablet (2.5 mg total) by mouth daily. 10/20/15 11/19/15  Daymon Larsen, MD  traMADol (ULTRAM) 50 MG tablet Take 1 tablet (50 mg total) by mouth every 6 (six) hours as needed. 10/27/15   Daymon Larsen, MD   Physical Exam: Vitals:   07/16/20 1400 07/16/20 1430 07/16/20 1500 07/16/20 1530  BP: 138/68 134/63 (!) 112/58 119/62  Pulse: 99 (!) 102 (!) 107 (!) 110  Resp: 15     Temp:      TempSrc:      SpO2: 98% 97% 97% 96%  Weight:      Height:       Constitutional: appears age-appropriate, NAD, calm, comfortable Eyes: PERRL, lids and conjunctivae normal ENMT: Mucous membranes are moist. Posterior pharynx clear of any exudate or lesions. Age-appropriate dentition. Hearing appropriate.  Left tympanic  membrane is positive for erythema Neck: normal, supple, no masses, no thyromegaly Respiratory: clear to auscultation bilaterally, no wheezing, no crackles. Normal respiratory effort. No accessory muscle use.  Cardiovascular: Regular rate and rhythm, no murmurs / rubs / gallops. No extremity edema. 2+ pedal pulses. No carotid bruits.  Abdomen: + tenderness, no masses palpated, no hepatosplenomegaly.  Overactive bowel sounds Musculoskeletal: no clubbing / cyanosis. No joint deformity upper and lower extremities. Good ROM, no contractures, no atrophy. Normal muscle tone.  Skin: no rashes, lesions, ulcers. No induration Neurologic: Sensation intact. Strength 5/5 in all 4.  Psychiatric: Normal judgment and insight. Alert and oriented x 3. Normal mood.    EKG: independently reviewed, showing sinus tachycardia with heart rate of 116, QTc 450  Chest x-ray on Admission: I personally reviewed and I agree with radiologist reading as below.  DG Chest 2 View  Result Date: 07/16/2020 CLINICAL DATA:  Sepsis EXAM: CHEST - 2 VIEW COMPARISON:  01/08/2019 FINDINGS: Cardiac shadow is within normal limits. Scarring in the left base is seen. Lungs are well aerated bilaterally. Extrinsic artifact is noted over the apices bilaterally. No focal infiltrate or effusion is seen. No acute bony abnormality is noted. Prior surgical fixation in the right humerus is noted. IMPRESSION: No acute abnormality noted.  Left basilar scarring is seen Electronically Signed   By: Inez Catalina M.D.   On: 07/16/2020 11:58   CT Head Wo Contrast  Result Date: 07/16/2020 CLINICAL DATA:  Facial trauma and headaches, initial encounter EXAM: CT HEAD WITHOUT CONTRAST TECHNIQUE: Contiguous axial images were obtained from the base of the skull through the vertex without intravenous contrast. COMPARISON:  10/27/2015 FINDINGS: Brain: No evidence of acute infarction, hemorrhage, hydrocephalus, extra-axial collection or mass lesion/mass effect. Mild atrophic changes are noted commenced with the patient's given age. Vascular: No hyperdense vessel or unexpected calcification. Skull: Normal. Negative for fracture or focal lesion. Sinuses/Orbits: No acute finding. Other: Right forehead hematoma is seen. IMPRESSION: Mild atrophic changes without acute intracranial abnormality. Right forehead hematoma Electronically Signed   By: Inez Catalina M.D.   On: 07/16/2020 11:46   CT ABDOMEN PELVIS W CONTRAST  Result Date: 07/16/2020 CLINICAL DATA:  Flank pain, diarrhea, vomiting after starting antibiotic for UTI EXAM: CT ABDOMEN AND PELVIS WITH CONTRAST TECHNIQUE: Multidetector CT imaging of the abdomen and pelvis was performed using the standard protocol following bolus administration of intravenous contrast.  CONTRAST:  34mL OMNIPAQUE IOHEXOL 300 MG/ML  SOLN COMPARISON:  2017 FINDINGS: Lower chest: Basilar atelectasis/scarring. Hepatobiliary: No focal liver abnormality is seen. Status post cholecystectomy. No biliary dilatation. Pancreas: Unremarkable. No pancreatic ductal dilatation or surrounding inflammatory changes. Spleen: Normal in size without focal abnormality. Adrenals/Urinary Tract: Adrenals are unremarkable. Few small right renal cysts and too small to characterize low-density lesions. Bladder is unremarkable. Stomach/Bowel: Stomach is unremarkable.  Bowel is normal in caliber. Vascular/Lymphatic: Aortic atherosclerosis. No enlarged lymph nodes. Reproductive: Uterus and bilateral adnexa are unremarkable. Other: No free fluid.  No acute abnormality of the abdominal wall. Musculoskeletal: Levocurvature of the lumbar spine. Degenerative changes of the lumbar spine. No acute osseous abnormality. IMPRESSION: No acute abnormality. No evidence of ascending urinary tract infection. Aortic atherosclerosis. Electronically Signed   By: Macy Mis M.D.   On: 07/16/2020 13:36    Labs on Admission: I have personally reviewed following labs  CBC: Recent Labs  Lab 07/16/20 1206  WBC 2.6*  NEUTROABS 1.5*  HGB 14.4  HCT 40.9  MCV 95.1  PLT 95*   Basic Metabolic  Panel: Recent Labs  Lab 07/16/20 1206  NA 135  K 3.8  CL 101  CO2 23  GLUCOSE 123*  BUN 32*  CREATININE 1.32*  CALCIUM 9.3   GFR: Estimated Creatinine Clearance: 33.2 mL/min (A) (by C-G formula based on SCr of 1.32 mg/dL (H)).  Liver Function Tests: Recent Labs  Lab 07/16/20 1206  AST 91*  ALT 79*  ALKPHOS 111  BILITOT 0.8  PROT 7.1  ALBUMIN 3.9   Recent Labs  Lab 07/16/20 1206  LIPASE 41   No results for input(s): AMMONIA in the last 168 hours. Coagulation Profile: Recent Labs  Lab 07/16/20 1206  INR 1.0   Urine analysis:    Component Value Date/Time   COLORURINE AMBER (A) 07/16/2020 1120   APPEARANCEUR CLEAR  (A) 07/16/2020 1120   APPEARANCEUR Clear 10/25/2011 1417   LABSPEC 1.025 07/16/2020 1120   LABSPEC 1.020 10/25/2011 1417   PHURINE 5.0 07/16/2020 1120   GLUCOSEU NEGATIVE 07/16/2020 1120   GLUCOSEU Negative 10/25/2011 1417   HGBUR SMALL (A) 07/16/2020 1120   BILIRUBINUR NEGATIVE 07/16/2020 1120   BILIRUBINUR Negative 10/25/2011 1417   KETONESUR NEGATIVE 07/16/2020 1120   PROTEINUR NEGATIVE 07/16/2020 1120   NITRITE POSITIVE (A) 07/16/2020 1120   LEUKOCYTESUR NEGATIVE 07/16/2020 1120   LEUKOCYTESUR Negative 10/25/2011 1417   Dr. Tobie Poet [facility]  If 7PM-7AM, please contact overnight-coverage provider If 7AM-7PM, please contact day coverage provider [email or web address]  07/16/2020, 4:32 PM

## 2020-07-16 NOTE — Progress Notes (Signed)
PHARMACY -  BRIEF ANTIBIOTIC NOTE   Pharmacy has received consult(s) for cefepime from an ED provider.  The patient's profile has been reviewed for ht/wt/allergies/indication/available labs.    One time order(s) placed by MD for Cefepime 2gm  Further antibiotics/pharmacy consults should be ordered by admitting physician if indicated.                       Thank you, Lanea Vankirk A 07/16/2020  12:20 PM

## 2020-07-16 NOTE — ED Notes (Signed)
Patient given pillow and sprite.

## 2020-07-16 NOTE — ED Provider Notes (Signed)
[facility] Emergency Department Provider Note  ____________________________________________   Event Date/Time   First MD Initiated Contact with Patient 07/16/20 1147     (approximate)  I have reviewed the triage vital signs and the nursing notes.   HISTORY  Chief Complaint Loss of Consciousness    HPI Brandy Haley is a 65 y.o. female with history of hypertension, hyperlipidemia, colitis, psoriatic arthritis on immunotherapy, here with multiple complaints.  The patient states that she has had recurrent infections over the last several weeks.  It started with a URI and bronchitis, complicated by left ear infection for which she has been on antibiotics.  She also had some urinary frequency and was started on a new antibiotic on the 27th.  Since then, she has had profuse, watery, diarrhea, along with fevers and worsening fatigue.  She is having difficulty getting around her house due to weakness.  She has lightheadedness upon standing.  She describes intermittent cramp-like, diffuse abdominal pain but this not severe or persistent.  She said fevers.  She has had chills.  Reports decreased appetite.  She states her left ear continues to hurt her, but this is been present for at least the last month and she has been seen by her PCP for this.  No drainage.  No bleeding.    Past Medical History:  Diagnosis Date   Diverticulitis    Ruptured abscess   GERD (gastroesophageal reflux disease)    GI bleed    Hypercholesteremia    Hypertension     Patient Active Problem List   Diagnosis Date Noted   Intractable vomiting with nausea 07/16/2020   Acute colitis 05/22/2015   Diarrhea 05/22/2015   Hypotension 05/22/2015   Acute posthemorrhagic anemia 05/22/2015   Hyperglycemia 05/22/2015   Rectal bleed 05/19/2015    Past Surgical History:  Procedure Laterality Date   APPENDECTOMY     CHOLECYSTECTOMY     COLONOSCOPY WITH PROPOFOL N/A 09/09/2015   Procedure:  COLONOSCOPY WITH PROPOFOL;  Surgeon: Lollie Sails, MD;  Location: [facility] ENDOSCOPY;  Service: Endoscopy;  Laterality: N/A;   COLOSTOMY     COLOSTOMY REVERSAL     ESOPHAGOGASTRODUODENOSCOPY (EGD) WITH PROPOFOL N/A 09/09/2015   Procedure: ESOPHAGOGASTRODUODENOSCOPY (EGD) WITH PROPOFOL;  Surgeon: Lollie Sails, MD;  Location: [facility] ENDOSCOPY;  Service: Endoscopy;  Laterality: N/A;   Orthopedic surgeries     [address] of her house and had multiple fractures repaired.     Prior to Admission medications   Medication Sig Start Date End Date Taking? Authorizing Provider  benazepril (LOTENSIN) 10 MG tablet Take 10 mg by mouth daily. for high blood pressure 09/13/17  Yes [provider]  Calcium Carbonate-Vitamin D 600-400 MG-UNIT tablet Take 1 tablet by mouth daily.   Yes [provider]  celecoxib (CELEBREX) 200 MG capsule Take 200 mg by mouth 2 (two) times daily. 07/06/20   [provider]  cyanocobalamin 1000 MCG tablet Take by mouth.    [provider]  ezetimibe (ZETIA) 10 MG tablet Take 10 mg by mouth daily. 06/30/20   [provider]  lisinopril (PRINIVIL,ZESTRIL) 2.5 MG tablet Take 1 tablet (2.5 mg total) by mouth daily. 10/20/15 11/19/15  Daymon Larsen, MD  Multiple Vitamin (MULTI-VITAMIN) tablet Take 1 tablet by mouth daily.    [provider]  traMADol (ULTRAM) 50 MG tablet Take 1 tablet (50 mg total) by mouth every 6 (six) hours as needed. 10/27/15   Daymon Larsen, MD  Allergies Ciprofloxacin  Family History  Problem Relation Age of Onset   Cancer Mother        Throat and neck  died age 81   CAD Mother 55       Stents   Stroke Father        Died age 90   Breast cancer Neg Hx     Social History Social History   Tobacco Use   Smoking status: Never   Smokeless tobacco: Never  Substance Use Topics   Alcohol use: No   Drug use: No    Review of Systems  Review of Systems  Constitutional:  Positive for chills,  fatigue and fever.  HENT:  Positive for ear pain. Negative for congestion and sore throat.   Eyes:  Negative for visual disturbance.  Respiratory:  Negative for cough and shortness of breath.   Cardiovascular:  Negative for chest pain.  Gastrointestinal:  Positive for abdominal pain, diarrhea and nausea. Negative for vomiting.  Genitourinary:  Negative for flank pain.  Musculoskeletal:  Negative for back pain and neck pain.  Skin:  Negative for rash and wound.  Neurological:  Positive for weakness.  All other systems reviewed and are negative.   ____________________________________________  PHYSICAL EXAM:      VITAL SIGNS: ED Triage Vitals  Enc Vitals Group     BP 07/16/20 1117 (!) 100/59     Pulse Rate 07/16/20 1117 (!) 114     Resp 07/16/20 1117 (!) 24     Temp 07/16/20 1117 (!) 100.4 F (38 C)     Temp Source 07/16/20 1117 Oral     SpO2 07/16/20 1117 96 %     Weight 07/16/20 1118 119 lb (54 kg)     Height 07/16/20 1118 5\' 2"  (1.575 m)     Head Circumference --      Peak Flow --      Pain Score 07/16/20 1117 10     Pain Loc --      Pain Edu? --      Excl. in [place]? --      Physical Exam Vitals and nursing note reviewed.  Constitutional:      General: She is not in acute distress.    Appearance: She is well-developed.  HENT:     Head: Normocephalic and atraumatic.     Comments: Moderate erythema left tympanic membrane.  No mastoid tenderness or erythema.  No overt purulence.  External canal normal.    Mouth/Throat:     Mouth: Mucous membranes are dry.  Eyes:     Conjunctiva/sclera: Conjunctivae normal.  Cardiovascular:     Rate and Rhythm: Regular rhythm. Tachycardia present.     Heart sounds: Normal heart sounds.  Pulmonary:     Effort: Pulmonary effort is normal. No respiratory distress.     Breath sounds: No wheezing.  Abdominal:     General: Bowel sounds are increased. There is no distension.     Comments: Hyperactive bowel sounds, mild diffuse tenderness   Musculoskeletal:     Cervical back: Neck supple.  Skin:    General: Skin is warm.     Capillary Refill: Capillary refill takes less than 2 seconds.     Findings: No rash.  Neurological:     Mental Status: She is alert and oriented to person, place, and time.     Motor: No abnormal muscle tone.      ____________________________________________   LABS (all labs ordered are listed, but only abnormal  results are displayed)  Labs Reviewed  COMPREHENSIVE METABOLIC PANEL - Abnormal; Notable for the following components:      Result Value   Glucose, Bld 123 (*)    BUN 32 (*)    Creatinine, Ser 1.32 (*)    AST 91 (*)    ALT 79 (*)    GFR, Estimated 45 (*)    All other components within normal limits  CBC WITH DIFFERENTIAL/PLATELET - Abnormal; Notable for the following components:   WBC 2.6 (*)    Platelets 95 (*)    Neutro Abs 1.5 (*)    Lymphs Abs 0.6 (*)    All other components within normal limits  URINALYSIS, COMPLETE (UACMP) WITH MICROSCOPIC - Abnormal; Notable for the following components:   Color, Urine AMBER (*)    APPearance CLEAR (*)    Hgb urine dipstick SMALL (*)    Nitrite POSITIVE (*)    Bacteria, UA FEW (*)    All other components within normal limits  RESP PANEL BY RT-PCR (FLU A&B, COVID) ARPGX2  CULTURE, BLOOD (ROUTINE X 2)  CULTURE, BLOOD (ROUTINE X 2)  C DIFFICILE QUICK SCREEN W PCR REFLEX    GASTROINTESTINAL PANEL BY PCR, STOOL (REPLACES STOOL CULTURE)  LACTIC ACID, PLASMA  PROTIME-INR  LIPASE, BLOOD  LACTIC ACID, PLASMA    ____________________________________________  EKG: Sinus tachycardia, ventricular rate 116.  PR 114, QRS 70, QTc 450.  No acute ST elevations or depressions.  No EKG evidence of acute ischemia or infarct. ________________________________________  RADIOLOGY All imaging, including plain films, CT scans, and ultrasounds, independently reviewed by me, and interpretations confirmed via formal radiology reads.  ED MD  interpretation:   Chest ray: Reviewed, unremarkable CT head: No acute intracranial abnormality CT abdomen/pelvis: No acute intra-abdominal pathology  Official radiology report(s): DG Chest 2 View  Result Date: 07/16/2020 CLINICAL DATA:  Sepsis EXAM: CHEST - 2 VIEW COMPARISON:  01/08/2019 FINDINGS: Cardiac shadow is within normal limits. Scarring in the left base is seen. Lungs are well aerated bilaterally. Extrinsic artifact is noted over the apices bilaterally. No focal infiltrate or effusion is seen. No acute bony abnormality is noted. Prior surgical fixation in the right humerus is noted. IMPRESSION: No acute abnormality noted.  Left basilar scarring is seen Electronically Signed   By: Inez Catalina M.D.   On: 07/16/2020 11:58   CT Head Wo Contrast  Result Date: 07/16/2020 CLINICAL DATA:  Facial trauma and headaches, initial encounter EXAM: CT HEAD WITHOUT CONTRAST TECHNIQUE: Contiguous axial images were obtained from the base of the skull through the vertex without intravenous contrast. COMPARISON:  10/27/2015 FINDINGS: Brain: No evidence of acute infarction, hemorrhage, hydrocephalus, extra-axial collection or mass lesion/mass effect. Mild atrophic changes are noted commenced with the patient's given age. Vascular: No hyperdense vessel or unexpected calcification. Skull: Normal. Negative for fracture or focal lesion. Sinuses/Orbits: No acute finding. Other: Right forehead hematoma is seen. IMPRESSION: Mild atrophic changes without acute intracranial abnormality. Right forehead hematoma Electronically Signed   By: Inez Catalina M.D.   On: 07/16/2020 11:46   CT ABDOMEN PELVIS W CONTRAST  Result Date: 07/16/2020 CLINICAL DATA:  Flank pain, diarrhea, vomiting after starting antibiotic for UTI EXAM: CT ABDOMEN AND PELVIS WITH CONTRAST TECHNIQUE: Multidetector CT imaging of the abdomen and pelvis was performed using the standard protocol following bolus administration of intravenous contrast. CONTRAST:   81mL OMNIPAQUE IOHEXOL 300 MG/ML  SOLN COMPARISON:  2017 FINDINGS: Lower chest: Basilar atelectasis/scarring. Hepatobiliary: No focal liver abnormality is seen. Status  post cholecystectomy. No biliary dilatation. Pancreas: Unremarkable. No pancreatic ductal dilatation or surrounding inflammatory changes. Spleen: Normal in size without focal abnormality. Adrenals/Urinary Tract: Adrenals are unremarkable. Few small right renal cysts and too small to characterize low-density lesions. Bladder is unremarkable. Stomach/Bowel: Stomach is unremarkable.  Bowel is normal in caliber. Vascular/Lymphatic: Aortic atherosclerosis. No enlarged lymph nodes. Reproductive: Uterus and bilateral adnexa are unremarkable. Other: No free fluid.  No acute abnormality of the abdominal wall. Musculoskeletal: Levocurvature of the lumbar spine. Degenerative changes of the lumbar spine. No acute osseous abnormality. IMPRESSION: No acute abnormality. No evidence of ascending urinary tract infection. Aortic atherosclerosis. Electronically Signed   By: Macy Mis M.D.   On: 07/16/2020 13:36    ____________________________________________  PROCEDURES   Procedure(s) performed (including Critical Care):  .Critical Care  Date/Time: 07/16/2020 1:55 PM Performed by: Duffy Bruce, MD Authorized by: Duffy Bruce, MD   Critical care provider statement:    Critical care time (minutes):  35   Critical care time was exclusive of:  Separately billable procedures and treating other patients and teaching time   Critical care was necessary to treat or prevent imminent or life-threatening deterioration of the following conditions:  Circulatory failure, cardiac failure and sepsis   Critical care was time spent personally by me on the following activities:  Development of treatment plan with patient or surrogate, discussions with consultants, evaluation of patient's response to treatment, examination of patient, obtaining history from  patient or surrogate, ordering and performing treatments and interventions, ordering and review of laboratory studies, ordering and review of radiographic studies, pulse oximetry, re-evaluation of patient's condition and review of old charts   I assumed direction of critical care for this patient from another provider in my specialty: no    ____________________________________________  INITIAL IMPRESSION / MDM / [place] / ED COURSE  As part of my medical decision making, I reviewed the following data within the [place] notes reviewed and incorporated, Old chart reviewed, Notes from prior ED visits, and [place] Controlled Substance Database       *[place] was evaluated in Emergency Department on 07/16/2020 for the symptoms described in the history of present illness. She was evaluated in the context of the global COVID-19 pandemic, which necessitated consideration that the patient might be at risk for infection with the SARS-CoV-2 virus that causes COVID-19. Institutional protocols and algorithms that pertain to the evaluation of patients at risk for COVID-19 are in a state of rapid change based on information released by regulatory bodies including the CDC and federal and state organizations. These policies and algorithms were followed during the patient's care in the ED.  Some ED evaluations and interventions may be delayed as a result of limited staffing during the pandemic.*     Medical Decision Making:  65 year old female here with generalized weakness, diarrhea, in the setting of multiple recent infections and antibiotic use.  Clinically, concern for possible antibiotic associated or C. difficile colitis.  CT abdomen and pelvis obtained, reviewed, and shows no acute abnormality.  CT head shows no acute abnormality and no evidence to suggest intracranial extension of her recurrent otitis.  Chest x-ray is clear without pneumonia.  Otherwise, patient does  have a possible UTI on UA here.  On arrival, she was febrile, tachycardic, borderline hypotensive, so code sepsis protocol initiated with broad-spectrum antibiotics for intra-abdominal or colitis coverage.  CMP shows elevated BUN and creatinine consistent with dehydration.  Lactic acid normal.  Moderate  leukopenia noted with left shift.  She is on immunosuppressants.  Will admit to the hospitalist for sepsis of unclear source, query colitis versus UTI.  ____________________________________________  FINAL CLINICAL IMPRESSION(S) / ED DIAGNOSES  Final diagnoses:  Sepsis without acute organ dysfunction, due to unspecified organism [facility])     MEDICATIONS GIVEN DURING THIS VISIT:  Medications  metroNIDAZOLE (FLAGYL) IVPB 500 mg (500 mg Intravenous New Bag/Given 07/16/20 1438)  traMADol (ULTRAM) tablet 50 mg (has no administration in time range)  lactated ringers bolus 1,000 mL (0 mLs Intravenous Stopped 07/16/20 1352)    And  lactated ringers bolus 500 mL (0 mLs Intravenous Stopped 07/16/20 1304)    And  lactated ringers bolus 250 mL (0 mLs Intravenous Stopped 07/16/20 1304)  ceFEPIme (MAXIPIME) 2 g in sodium chloride 0.9 % 100 mL IVPB (0 g Intravenous Stopped 07/16/20 1304)  iohexol (OMNIPAQUE) 300 MG/ML solution 75 mL (75 mLs Intravenous Contrast Given 07/16/20 1257)     ED Discharge Orders     None        Note:  This document was prepared using Dragon voice recognition software and may include unintentional dictation errors.   Duffy Bruce, MD 07/16/20 [phone]

## 2020-07-16 NOTE — ED Notes (Signed)
Patient transported to CT 

## 2020-07-16 NOTE — ED Triage Notes (Signed)
Pt to ED for multiple complaints. Reports being tested for UTI on Sunday, c.o flank pain, diarrhea and vomiting since starting on antibiotic. States she passed out and hit head after having multiple episodes of diarrhea yesterday. Knot and bruising noted to left forehead.

## 2020-07-16 NOTE — Progress Notes (Signed)
CODE SEPSIS - PHARMACY COMMUNICATION  **Broad Spectrum Antibiotics should be administered within 1 hour of Sepsis diagnosis**  Time Code Sepsis Called/Page Received: 6/29 1208  Antibiotics Ordered: cefepime  Time of 1st antibiotic administration: 1231  Additional action taken by pharmacy:    If necessary, Name of Provider/Nurse Contacted:      Noralee Space ,PharmD Clinical Pharmacist  07/16/2020  12:38 PM

## 2020-07-16 NOTE — Sepsis Progress Note (Signed)
eLink is monitoring this Code Sepsis. °

## 2020-07-17 ENCOUNTER — Encounter: Payer: Self-pay | Admitting: Family Medicine

## 2020-07-17 DIAGNOSIS — I700 Atherosclerosis of aorta: Secondary | ICD-10-CM | POA: Diagnosis present

## 2020-07-17 DIAGNOSIS — R112 Nausea with vomiting, unspecified: Secondary | ICD-10-CM

## 2020-07-17 DIAGNOSIS — K219 Gastro-esophageal reflux disease without esophagitis: Secondary | ICD-10-CM | POA: Diagnosis present

## 2020-07-17 DIAGNOSIS — Z881 Allergy status to other antibiotic agents status: Secondary | ICD-10-CM | POA: Diagnosis not present

## 2020-07-17 DIAGNOSIS — D696 Thrombocytopenia, unspecified: Secondary | ICD-10-CM | POA: Diagnosis not present

## 2020-07-17 DIAGNOSIS — I7300 Raynaud's syndrome without gangrene: Secondary | ICD-10-CM | POA: Diagnosis present

## 2020-07-17 DIAGNOSIS — L4050 Arthropathic psoriasis, unspecified: Secondary | ICD-10-CM | POA: Diagnosis present

## 2020-07-17 DIAGNOSIS — Z79899 Other long term (current) drug therapy: Secondary | ICD-10-CM | POA: Diagnosis not present

## 2020-07-17 DIAGNOSIS — A419 Sepsis, unspecified organism: Secondary | ICD-10-CM | POA: Diagnosis present

## 2020-07-17 DIAGNOSIS — N3000 Acute cystitis without hematuria: Secondary | ICD-10-CM | POA: Diagnosis present

## 2020-07-17 DIAGNOSIS — D708 Other neutropenia: Secondary | ICD-10-CM

## 2020-07-17 DIAGNOSIS — N179 Acute kidney failure, unspecified: Secondary | ICD-10-CM

## 2020-07-17 DIAGNOSIS — Z20822 Contact with and (suspected) exposure to covid-19: Secondary | ICD-10-CM | POA: Diagnosis present

## 2020-07-17 DIAGNOSIS — E785 Hyperlipidemia, unspecified: Secondary | ICD-10-CM | POA: Diagnosis present

## 2020-07-17 DIAGNOSIS — I10 Essential (primary) hypertension: Secondary | ICD-10-CM | POA: Diagnosis present

## 2020-07-17 DIAGNOSIS — Z8249 Family history of ischemic heart disease and other diseases of the circulatory system: Secondary | ICD-10-CM | POA: Diagnosis not present

## 2020-07-17 DIAGNOSIS — R197 Diarrhea, unspecified: Secondary | ICD-10-CM | POA: Diagnosis not present

## 2020-07-17 DIAGNOSIS — R739 Hyperglycemia, unspecified: Secondary | ICD-10-CM | POA: Diagnosis present

## 2020-07-17 DIAGNOSIS — L409 Psoriasis, unspecified: Secondary | ICD-10-CM | POA: Diagnosis present

## 2020-07-17 DIAGNOSIS — K529 Noninfective gastroenteritis and colitis, unspecified: Secondary | ICD-10-CM | POA: Diagnosis present

## 2020-07-17 DIAGNOSIS — D61818 Other pancytopenia: Secondary | ICD-10-CM | POA: Diagnosis present

## 2020-07-17 DIAGNOSIS — Z28310 Unvaccinated for covid-19: Secondary | ICD-10-CM | POA: Diagnosis not present

## 2020-07-17 LAB — CBC WITH DIFFERENTIAL/PLATELET
Abs Immature Granulocytes: 0.01 10*3/uL (ref 0.00–0.07)
Basophils Absolute: 0 10*3/uL (ref 0.0–0.1)
Basophils Relative: 1 %
Eosinophils Absolute: 0 10*3/uL (ref 0.0–0.5)
Eosinophils Relative: 1 %
HCT: 31.9 % — ABNORMAL LOW (ref 36.0–46.0)
Hemoglobin: 11.3 g/dL — ABNORMAL LOW (ref 12.0–15.0)
Immature Granulocytes: 1 %
Lymphocytes Relative: 39 %
Lymphs Abs: 0.8 10*3/uL (ref 0.7–4.0)
MCH: 34.1 pg — ABNORMAL HIGH (ref 26.0–34.0)
MCHC: 35.4 g/dL (ref 30.0–36.0)
MCV: 96.4 fL (ref 80.0–100.0)
Monocytes Absolute: 0.3 10*3/uL (ref 0.1–1.0)
Monocytes Relative: 17 %
Neutro Abs: 0.8 10*3/uL — ABNORMAL LOW (ref 1.7–7.7)
Neutrophils Relative %: 41 %
Platelets: 72 10*3/uL — ABNORMAL LOW (ref 150–400)
RBC: 3.31 MIL/uL — ABNORMAL LOW (ref 3.87–5.11)
RDW: 14.5 % (ref 11.5–15.5)
Smear Review: NORMAL
WBC: 2 10*3/uL — ABNORMAL LOW (ref 4.0–10.5)
nRBC: 0 % (ref 0.0–0.2)

## 2020-07-17 LAB — RETICULOCYTES
Immature Retic Fract: 9.4 % (ref 2.3–15.9)
RBC.: 3.43 MIL/uL — ABNORMAL LOW (ref 3.87–5.11)
Retic Count, Absolute: 44.6 10*3/uL (ref 19.0–186.0)
Retic Ct Pct: 1.3 % (ref 0.4–3.1)

## 2020-07-17 LAB — BASIC METABOLIC PANEL
Anion gap: 6 (ref 5–15)
BUN: 17 mg/dL (ref 8–23)
CO2: 22 mmol/L (ref 22–32)
Calcium: 7.6 mg/dL — ABNORMAL LOW (ref 8.9–10.3)
Chloride: 104 mmol/L (ref 98–111)
Creatinine, Ser: 0.63 mg/dL (ref 0.44–1.00)
GFR, Estimated: >60 mL/min (ref >60)
Glucose, Bld: 112 mg/dL — ABNORMAL HIGH (ref 70–99)
Potassium: 3 mmol/L — ABNORMAL LOW (ref 3.5–5.1)
Sodium: 132 mmol/L — ABNORMAL LOW (ref 135–145)

## 2020-07-17 LAB — CBC
HCT: 31.8 % — ABNORMAL LOW (ref 36.0–46.0)
Hemoglobin: 11.2 g/dL — ABNORMAL LOW (ref 12.0–15.0)
MCH: 33.6 pg (ref 26.0–34.0)
MCHC: 35.2 g/dL (ref 30.0–36.0)
MCV: 95.5 fL (ref 80.0–100.0)
Platelets: 73 10*3/uL — ABNORMAL LOW (ref 150–400)
RBC: 3.33 MIL/uL — ABNORMAL LOW (ref 3.87–5.11)
RDW: 14.3 % (ref 11.5–15.5)
WBC: 1.9 10*3/uL — ABNORMAL LOW (ref 4.0–10.5)
nRBC: 0 % (ref 0.0–0.2)

## 2020-07-17 LAB — LACTIC ACID, PLASMA: Lactic Acid, Venous: 1 mmol/L (ref 0.5–1.9)

## 2020-07-17 LAB — TROPONIN I (HIGH SENSITIVITY)
Troponin I (High Sensitivity): 19 ng/L — ABNORMAL HIGH (ref <18)
Troponin I (High Sensitivity): 22 ng/L — ABNORMAL HIGH (ref <18)

## 2020-07-17 LAB — PATHOLOGIST SMEAR REVIEW

## 2020-07-17 LAB — IMMATURE PLATELET FRACTION: Immature Platelet Fraction: 5 % (ref 1.2–8.6)

## 2020-07-17 LAB — LACTATE DEHYDROGENASE: LDH: 313 U/L — ABNORMAL HIGH (ref 98–192)

## 2020-07-17 LAB — HIV ANTIBODY (ROUTINE TESTING W REFLEX): HIV Screen 4th Generation wRfx: NONREACTIVE

## 2020-07-17 MED ORDER — ALUM & MAG HYDROXIDE-SIMETH 200-200-20 MG/5ML PO SUSP: 30.0000 mL | ORAL | Status: DC | PRN

## 2020-07-17 MED ORDER — POTASSIUM CHLORIDE CRYS ER 20 MEQ PO TBCR
40.0000 meq | EXTENDED_RELEASE_TABLET | ORAL | Status: AC
  Administered 2020-07-17 (×2): 40 meq via ORAL
  Filled 2020-07-17 (×2): qty 2

## 2020-07-17 NOTE — Progress Notes (Signed)
Pt c/o dull mid chest pain 5/10, radiating through her back and up through her throat, described as indigestion. EKG obtained/MD made aware orders for STAT troponin. PRN ultram given per pt request. Will continue to monitor.

## 2020-07-17 NOTE — ED Notes (Signed)
This EDT obtained pt VS, pt spo2 at 90% RA. Notified RN, RN put 2 L on pt, pt spo2 now at 95%
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
## 2020-07-17 NOTE — Progress Notes (Signed)
PROGRESS NOTE    Brandy Haley  OMV:[id number] DOB: Dec 05, 1955 DOA: 07/16/2020 PCP: Gladstone Lighter, MD  Chief Complaint  Patient presents with   Loss of Consciousness   Brief Narrative:  65 yo F with hx HTN, GERD, HLD, raynuad's disease, diverticulosis, zoster, psoriatic arthhritis, psoriasis, seronegative spondyloarthropathy on cimzia who presented to the ED with diarrhea.  She'd recently been started on abx for Ngan Qualls UTI and otitis media on 6/27.  She'd had abx prior to that on 6/9 for an ear infection and bronchitis.  Assessment & Plan:   Principal Problem:   Intractable vomiting with nausea Active Problems:   Acute colitis   Diarrhea   Hyperglycemia   AKI (acute kidney injury) ([place])   Essential hypertension   Hyperlipidemia   Chronic GERD   Psoriatic arthritis (HCC)   Thrombocytopenia (HCC)  # Diarrhea  Sepsis  - tachy, fever, leukopenia with GI source with diarrhea - follow GI path panel and C diff PCR - s/p abx in ED, continue to hold - while determining whether source needs abx - tramadol for pain, zofran nausea - follow blood cultures - UA with pyuria, nitrite positive - follow urine culture - will review whether she's symptomatic  # Pancytopenia  Neutropenia - ANC 0.8, falling - trend - possible that all this is reactive, related to suspected infectious etiology/sepsis above - given new thrombocytopenia, neutropenia in setting of fever/diarrhea, will ask oncology to comment  - follow smear (attention for schistocytes in setting of thrombocytopenia, fever - consider MAHA), reticulocytes, LDH, iron, b12, folate, ferritin  # Chest Discomfort - follow troponin, EKG without concerning findings  # Acute kidney injury - resolved with IVF   # Hypertension - hold home ace - BP normal to soft, follow   # Hyperlipidemia- hold zetia   # Psoriatic arthritis # Seronegative spondyloarthropathy - Follow-up with rheumatology outpatient -- cimzia on hold    # CT  evidence of aortic atherosclerosis-outpatient follow-up -Patient is not having chest pain and/or shortness of breath at this time, I have no clinical suspicion for ACS Chart reviewed.    06/26/2020: Left ear infection and acute bronchitis was sent home with Keaja Reaume Z-Pak, prednisone 20 mg daily for 4 days   07/14/2020: Acute cystitis, cefdinir 300 mg twice daily, 7 days and phenazopyridine  DVT prophylaxis: SCD Code Status: (full  Family Communication: none at bedside Disposition:   Status is: Inpatient  Remains inpatient appropriate because:Inpatient level of care appropriate due to severity of illness  Dispo: The patient is from: Home              Anticipated d/c is to: Home              Patient currently is not medically stable to d/c.   Difficult to place patient No       Consultants:  oncology  Procedures:  none  Antimicrobials:  Anti-infectives (From admission, onward)    Start     Dose/Rate Route Frequency Ordered Stop   07/16/20 1230  metroNIDAZOLE (FLAGYL) IVPB 500 mg        500 mg 100 mL/hr over 60 Minutes Intravenous  Once 07/16/20 1228 07/16/20 1540   07/16/20 1215  ceFEPIme (MAXIPIME) 2 g in sodium chloride 0.9 % 100 mL IVPB        2 g 200 mL/hr over 30 Minutes Intravenous  Once 07/16/20 1203 07/16/20 1304      Subjective: Feels Brandy Haley little better, still not to her baseline  Objective:  Vitals:   07/17/20 0700 07/17/20 1155 07/17/20 1339 07/17/20 1528  BP:  116/68 121/71 97/61  Pulse:  98 (!) 104 89  Resp: (!) 22 20 19 18   Temp:   (!) 102.4 F (39.1 C) 98.8 F (37.1 C)  TempSrc:   Oral Oral  SpO2:  94% 95% 95%  Weight:      Height:        Intake/Output Summary (Last 24 hours) at 07/17/2020 1529 Last data filed at 07/17/2020 1500 Gross per 24 hour  Intake 350 ml  Output --  Net 350 ml   Filed Weights   07/16/20 1118  Weight: 54 kg    Examination:  General exam: Appears calm and comfortable  Respiratory system: Clear to auscultation.  Respiratory effort normal. Cardiovascular system: S1 & S2 heard, RRR.  Gastrointestinal system: Abdomen is nondistended, soft and nontender.  Central nervous system: Alert and oriented. No focal neurological deficits. Extremities: no LEE Skin: No rashes, lesions or ulcers Psychiatry: Judgement and insight appear normal. Mood & affect appropriate.     Data Reviewed: I have personally reviewed following labs and imaging studies  CBC: Recent Labs  Lab 07/16/20 1206 07/17/20 0506 07/17/20 0610  WBC 2.6* 2.0* 1.9*  NEUTROABS 1.5* 0.8*  --   HGB 14.4 11.3* 11.2*  HCT 40.9 31.9* 31.8*  MCV 95.1 96.4 95.5  PLT 95* 72* 73*    Basic Metabolic Panel: Recent Labs  Lab 07/16/20 1206 07/17/20 0610  NA 135 132*  K 3.8 3.0*  CL 101 104  CO2 23 22  GLUCOSE 123* 112*  BUN 32* 17  CREATININE 1.32* 0.63  CALCIUM 9.3 7.6*    GFR: Estimated Creatinine Clearance: 54.7 mL/min (by C-G formula based on SCr of 0.63 mg/dL).  Liver Function Tests: Recent Labs  Lab 07/16/20 1206  AST 91*  ALT 79*  ALKPHOS 111  BILITOT 0.8  PROT 7.1  ALBUMIN 3.9    CBG: No results for input(s): GLUCAP in the last 168 hours.   Recent Results (from the past 240 hour(s))  Culture, blood (Routine x 2)     Status: None (Preliminary result)   Collection Time: 07/16/20 12:06 PM   Specimen: Left Antecubital; Blood  Result Value Ref Range Status   Specimen Description LEFT ANTECUBITAL  Final   Special Requests   Final    Blood Culture adequate volume BOTTLES DRAWN AEROBIC AND ANAEROBIC   Culture   Final    NO GROWTH < 24 HOURS Performed at [facility], [address], [place], [place] [address]    Report Status PENDING  Incomplete  Resp Panel by RT-PCR (Flu Nel Stoneking&B, Covid) Nasopharyngeal Swab     Status: None   Collection Time: 07/16/20 12:06 PM   Specimen: Nasopharyngeal Swab; Nasopharyngeal(NP) swabs in vial transport medium  Result Value Ref Range Status   SARS Coronavirus 2 by RT PCR  NEGATIVE NEGATIVE Final    Comment: (NOTE) SARS-CoV-2 target nucleic acids are NOT DETECTED.  The SARS-CoV-2 RNA is generally detectable in upper respiratory specimens during the acute phase of infection. The lowest concentration of SARS-CoV-2 viral copies this assay can detect is 138 copies/mL. Celisa Schoenberg negative result does not preclude SARS-Cov-2 infection and should not be used as the sole basis for treatment or other patient management decisions. Donyell Ding negative result may occur with  improper specimen collection/handling, submission of specimen other than nasopharyngeal swab, presence of viral mutation(s) within the areas targeted by this assay, and inadequate number of viral copies(<138  copies/mL). Seferina Brokaw negative result must be combined with clinical observations, patient history, and epidemiological information. The expected result is Negative.  Fact Sheet for Patients:  EntrepreneurPulse.com.au  Fact Sheet for Healthcare Providers:  IncredibleEmployment.be  This test is no t yet approved or cleared by the [place] FDA and  has been authorized for detection and/or diagnosis of SARS-CoV-2 by FDA under an Emergency Use Authorization (EUA). This EUA will remain  in effect (meaning this test can be used) for the duration of the COVID-19 declaration under Section 564(b)(1) of the Act, 21 U.S.C.section 360bbb-3(b)(1), unless the authorization is terminated  or revoked sooner.       Influenza Khiem Gargis by PCR NEGATIVE NEGATIVE Final   Influenza B by PCR NEGATIVE NEGATIVE Final    Comment: (NOTE) The Xpert Xpress SARS-CoV-2/FLU/RSV plus assay is intended as an aid in the diagnosis of influenza from Nasopharyngeal swab specimens and should not be used as Burnice Vassel sole basis for treatment. Nasal washings and aspirates are unacceptable for Xpert Xpress SARS-CoV-2/FLU/RSV testing.  Fact Sheet for Patients: EntrepreneurPulse.com.au  Fact Sheet for  Healthcare Providers: IncredibleEmployment.be  This test is not yet approved or cleared by the [place] FDA and has been authorized for detection and/or diagnosis of SARS-CoV-2 by FDA under an Emergency Use Authorization (EUA). This EUA will remain in effect (meaning this test can be used) for the duration of the COVID-19 declaration under Section 564(b)(1) of the Act, 21 U.S.C. section 360bbb-3(b)(1), unless the authorization is terminated or revoked.  Performed at [facility], [address] [place], [place] [address]   Culture, blood (Routine x 2)     Status: None (Preliminary result)   Collection Time: 07/16/20 12:07 PM   Specimen: Right Antecubital; Blood  Result Value Ref Range Status   Specimen Description RIGHT ANTECUBITAL  Final   Special Requests   Final    Blood Culture adequate volume BOTTLES DRAWN AEROBIC AND ANAEROBIC   Culture   Final    NO GROWTH < 24 HOURS Performed at [facility], [address] [place], [place] [address]    Report Status PENDING  Incomplete         Radiology Studies: DG Chest 2 View  Result Date: 07/16/2020 CLINICAL DATA:  Sepsis EXAM: CHEST - 2 VIEW COMPARISON:  01/08/2019 FINDINGS: Cardiac shadow is within normal limits. Scarring in the left base is seen. Lungs are well aerated bilaterally. Extrinsic artifact is noted over the apices bilaterally. No focal infiltrate or effusion is seen. No acute bony abnormality is noted. Prior surgical fixation in the right humerus is noted. IMPRESSION: No acute abnormality noted.  Left basilar scarring is seen Electronically Signed   By: Inez Catalina M.D.   On: 07/16/2020 11:58   CT Head Wo Contrast  Result Date: 07/16/2020 CLINICAL DATA:  Facial trauma and headaches, initial encounter EXAM: CT HEAD WITHOUT CONTRAST TECHNIQUE: Contiguous axial images were obtained from the base of the skull through the vertex without intravenous contrast. COMPARISON:   10/27/2015 FINDINGS: Brain: No evidence of acute infarction, hemorrhage, hydrocephalus, extra-axial collection or mass lesion/mass effect. Mild atrophic changes are noted commenced with the patient's given age. Vascular: No hyperdense vessel or unexpected calcification. Skull: Normal. Negative for fracture or focal lesion. Sinuses/Orbits: No acute finding. Other: Right forehead hematoma is seen. IMPRESSION: Mild atrophic changes without acute intracranial abnormality. Right forehead hematoma Electronically Signed   By: Inez Catalina M.D.   On: 07/16/2020 11:46   CT ABDOMEN PELVIS W CONTRAST  Result Date: 07/16/2020 CLINICAL DATA:  Flank pain, diarrhea, vomiting after starting antibiotic for UTI EXAM: CT ABDOMEN AND PELVIS WITH CONTRAST TECHNIQUE: Multidetector CT imaging of the abdomen and pelvis was performed using the standard protocol following bolus administration of intravenous contrast. CONTRAST:  57mL OMNIPAQUE IOHEXOL 300 MG/ML  SOLN COMPARISON:  2017 FINDINGS: Lower chest: Basilar atelectasis/scarring. Hepatobiliary: No focal liver abnormality is seen. Status post cholecystectomy. No biliary dilatation. Pancreas: Unremarkable. No pancreatic ductal dilatation or surrounding inflammatory changes. Spleen: Normal in size without focal abnormality. Adrenals/Urinary Tract: Adrenals are unremarkable. Few small right renal cysts and too small to characterize low-density lesions. Bladder is unremarkable. Stomach/Bowel: Stomach is unremarkable.  Bowel is normal in caliber. Vascular/Lymphatic: Aortic atherosclerosis. No enlarged lymph nodes. Reproductive: Uterus and bilateral adnexa are unremarkable. Other: No free fluid.  No acute abnormality of the abdominal wall. Musculoskeletal: Levocurvature of the lumbar spine. Degenerative changes of the lumbar spine. No acute osseous abnormality. IMPRESSION: No acute abnormality. No evidence of ascending urinary tract infection. Aortic atherosclerosis. Electronically Signed    By: Macy Mis M.D.   On: 07/16/2020 13:36        Scheduled Meds:  enoxaparin (LOVENOX) injection  40 mg Subcutaneous Q24H   nystatin  5 mL Mouth/Throat QID   Continuous Infusions:   LOS: 0 days    Time spent: over 30 min    Fayrene Helper, MD [facility]   To contact the attending provider between 7A-7P or the covering provider during after hours 7P-7A, please log into the web site [email or web address] and access using universal [place] password for that web site. If you do not have the password, please call the hospital operator.  07/17/2020, 3:29 PM

## 2020-07-18 LAB — GASTROINTESTINAL PANEL BY PCR, STOOL (REPLACES STOOL CULTURE)

## 2020-07-18 LAB — CBC WITH DIFFERENTIAL/PLATELET
Abs Immature Granulocytes: 0.02 10*3/uL (ref 0.00–0.07)
Basophils Absolute: 0 10*3/uL (ref 0.0–0.1)
Basophils Relative: 1 %
Eosinophils Absolute: 0 10*3/uL (ref 0.0–0.5)
Eosinophils Relative: 0 %
HCT: 30.7 % — ABNORMAL LOW (ref 36.0–46.0)
Hemoglobin: 10.7 g/dL — ABNORMAL LOW (ref 12.0–15.0)
Immature Granulocytes: 1 %
Lymphocytes Relative: 45 %
Lymphs Abs: 1.8 10*3/uL (ref 0.7–4.0)
MCH: 33.5 pg (ref 26.0–34.0)
MCHC: 34.9 g/dL (ref 30.0–36.0)
MCV: 96.2 fL (ref 80.0–100.0)
Monocytes Absolute: 0.4 10*3/uL (ref 0.1–1.0)
Monocytes Relative: 9 %
Neutro Abs: 1.8 10*3/uL (ref 1.7–7.7)
Neutrophils Relative %: 44 %
Platelets: 80 10*3/uL — ABNORMAL LOW (ref 150–400)
RBC: 3.19 MIL/uL — ABNORMAL LOW (ref 3.87–5.11)
RDW: 14.9 % (ref 11.5–15.5)
Smear Review: NORMAL
WBC: 4 10*3/uL (ref 4.0–10.5)
nRBC: 0 % (ref 0.0–0.2)

## 2020-07-18 LAB — COMPREHENSIVE METABOLIC PANEL
ALT: 38 U/L (ref 0–44)
AST: 48 U/L — ABNORMAL HIGH (ref 15–41)
Albumin: 2.6 g/dL — ABNORMAL LOW (ref 3.5–5.0)
Alkaline Phosphatase: 60 U/L (ref 38–126)
Anion gap: 4 — ABNORMAL LOW (ref 5–15)
BUN: 14 mg/dL (ref 8–23)
CO2: 20 mmol/L — ABNORMAL LOW (ref 22–32)
Calcium: 7.8 mg/dL — ABNORMAL LOW (ref 8.9–10.3)
Chloride: 108 mmol/L (ref 98–111)
Creatinine, Ser: 0.57 mg/dL (ref 0.44–1.00)
GFR, Estimated: >60 mL/min (ref >60)
Glucose, Bld: 93 mg/dL (ref 70–99)
Potassium: 3.9 mmol/L (ref 3.5–5.1)
Sodium: 132 mmol/L — ABNORMAL LOW (ref 135–145)
Total Bilirubin: 0.7 mg/dL (ref 0.3–1.2)
Total Protein: 5 g/dL — ABNORMAL LOW (ref 6.5–8.1)

## 2020-07-18 LAB — FERRITIN: Ferritin: 1465 ng/mL — ABNORMAL HIGH (ref 11–307)

## 2020-07-18 LAB — HEPATITIS PANEL, ACUTE
HCV Ab: NONREACTIVE
Hep A IgM: NONREACTIVE
Hep B C IgM: NONREACTIVE
Hepatitis B Surface Ag: NONREACTIVE

## 2020-07-18 LAB — IRON AND TIBC
Iron: 24 ug/dL — ABNORMAL LOW (ref 28–170)
Saturation Ratios: 12 % (ref 10.4–31.8)
TIBC: 195 ug/dL — ABNORMAL LOW (ref 250–450)
UIBC: 171 ug/dL

## 2020-07-18 LAB — FOLATE: Folate: 56 ng/mL (ref >5.9)

## 2020-07-18 LAB — VITAMIN B12: Vitamin B-12: 2185 pg/mL — ABNORMAL HIGH (ref 180–914)

## 2020-07-18 MED ORDER — NYSTATIN 100000 UNIT/ML MT SUSP: 5.0000 mL | Freq: Four times a day (QID) | OROMUCOSAL | 0 refills | Status: AC

## 2020-07-18 MED ORDER — DICLOFENAC SODIUM 1 % EX GEL
2.0000 g | Freq: Four times a day (QID) | CUTANEOUS | Status: DC | PRN
  Filled 2020-07-18: qty 100

## 2020-07-18 MED ORDER — FERROUS SULFATE 325 (65 FE) MG PO TABS: 325.0000 mg | ORAL_TABLET | Freq: Every day | ORAL | 1 refills | Status: DC

## 2020-07-18 NOTE — Progress Notes (Signed)
   07/17/20 1339  Assess: MEWS Score  Temp (!) 102.4 F (39.1 C)  BP 121/71  Pulse Rate (!) 104  Resp 19  Level of Consciousness Alert  SpO2 95 %  O2 Device Room Air  Patient Activity (if Appropriate) In bed  Assess: MEWS Score  MEWS Temp 2  MEWS Systolic 0  MEWS Pulse 1  MEWS RR 0  MEWS LOC 0  MEWS Score 3  MEWS Score Color Yellow  Assess: if the MEWS score is Yellow or Red  Were vital signs taken at a resting state? Yes  Focused Assessment No change from prior assessment  Does the patient meet 2 or more of the SIRS criteria? Yes  MEWS guidelines implemented *See Row Information* Yes  Treat  MEWS Interventions Administered prn meds/treatments  Pain Scale 0-10  Pain Score 0  Take Vital Signs  Increase Vital Sign Frequency  Yellow: Q 2hr X 2 then Q 4hr X 2, if remains yellow, continue Q 4hrs  Escalate  MEWS: Escalate Yellow: discuss with charge nurse/RN and consider discussing with provider and RRT  Notify: Charge Nurse/RN  Name of Charge Nurse/RN Notified Butch Penny  Date Charge Nurse/RN Notified 07/17/20  Time Charge Nurse/RN Notified 1423  Document  Patient Outcome Other (Comment) (continue to monitor)  Assess: SIRS CRITERIA  SIRS Temperature  1  SIRS Pulse 1  SIRS Respirations  0  SIRS WBC 0  SIRS Score Sum  2  Inserted for [employer] RN

## 2020-07-18 NOTE — TOC Initial Note (Signed)
Transition of Care [facility]) - Initial/Assessment Note    Patient Details  Name: Brandy Haley MRN: [id number] Date of Birth: 07/29/1955  Transition of Care [employer]) CM/SW Contact:    Eileen Stanford, LCSW Phone Number: 07/18/2020, 2:10 PM  Clinical Narrative:   CSW spoke with pt and pt is refusing [place] at this time stating "I live with my husband and daughter I won't need it." Pt states she has all the DME she needs at home. No additional needs at this time.                Expected Discharge Plan: Home/Self Care Barriers to Discharge: Continued Medical Work up   Patient Goals and CMS Choice Patient states their goals for this hospitalization and ongoing recovery are:: to go home   Choice offered to / list presented to : Patient  Expected Discharge Plan and Services Expected Discharge Plan: Home/Self Care     Post Acute Care Choice: NA Living arrangements for the past 2 months: Single Family Home                                      Prior Living Arrangements/Services Living arrangements for the past 2 months: Single Family Home Lives with:: Spouse Patient language and need for interpreter reviewed:: Yes        Need for Family Participation in Patient Care: Yes (Comment) Care giver support system in place?: Yes (comment)   Criminal Activity/Legal Involvement Pertinent to Current Situation/Hospitalization: No - Comment as needed  Activities of Daily Living Home Assistive Devices/Equipment: None ADL Screening (condition at time of admission) Patient's cognitive ability adequate to safely complete daily activities?: Yes Is the patient deaf or have difficulty hearing?: No Does the patient have difficulty seeing, even when wearing glasses/contacts?: No Does the patient have difficulty concentrating, remembering, or making decisions?: No Patient able to express need for assistance with ADLs?: No Does the patient have difficulty dressing or bathing?: No Independently  performs ADLs?: Yes (appropriate for developmental age) Does the patient have difficulty walking or climbing stairs?: No Weakness of Legs: None Weakness of Arms/Hands: None  Permission Sought/Granted Permission sought to share information with : Family Supports    Share Information with NAME: Chrissie Noa     Permission granted to share info w Relationship: spouse     Emotional Assessment Appearance:: Appears stated age Attitude/Demeanor/Rapport: Engaged Affect (typically observed): Accepting Orientation: : Oriented to Self, Oriented to Place, Oriented to Situation, Oriented to  Time Alcohol / Substance Use: Not Applicable Psych Involvement: No (comment)  Admission diagnosis:  Intractable vomiting with nausea [R11.2] Sepsis without acute organ dysfunction, due to unspecified organism ([place]) [A41.9] Thrombocytopenia ([place]) [D69.6] Patient Active Problem List   Diagnosis Date Noted   Thrombocytopenia ([place]) 07/17/2020   Other neutropenia (HCC)    Intractable vomiting with nausea 07/16/2020   AKI (acute kidney injury) ([place]) 07/16/2020   Essential hypertension 07/16/2020   Hyperlipidemia 07/16/2020   Chronic GERD 07/16/2020   Psoriatic arthritis ([place]) 07/16/2020   Acute colitis 05/22/2015   Diarrhea 05/22/2015   Hypotension 05/22/2015   Acute posthemorrhagic anemia 05/22/2015   Hyperglycemia 05/22/2015   Rectal bleed 05/19/2015   PCP:  Gladstone Lighter, MD Pharmacy:   [id number] [address], [place] [address] Phone: [phone] Fax: [phone]     Social Determinants of Health (SDOH) Interventions  Readmission Risk Interventions No flowsheet data found.   

## 2020-07-18 NOTE — Progress Notes (Signed)
Hematology/Oncology Progress Note [facility] [phone] [phone]  Patient Care Team: Gladstone Lighter, MD as PCP - General (Internal Medicine)   Name of the patient: Brandy Haley  [id number]  12-Dec-1955  Date of visit: 07/18/20   INTERVAL HISTORY-  Diarrhea has stopped.  No abdominal pain.  No fever or chills.  She feels better. GI panel is pending.   Review of systems- Review of Systems  Constitutional:  Negative for appetite change, chills, fatigue and fever.  HENT:   Negative for hearing loss and voice change.   Eyes:  Negative for eye problems.  Respiratory:  Negative for chest tightness and cough.   Cardiovascular:  Negative for chest pain.  Gastrointestinal:  Negative for abdominal distention, abdominal pain and blood in stool.  Endocrine: Negative for hot flashes.  Genitourinary:  Negative for difficulty urinating and frequency.   Musculoskeletal:  Negative for arthralgias.  Skin:  Negative for itching and rash.  Neurological:  Negative for extremity weakness.  Hematological:  Negative for adenopathy.  Psychiatric/Behavioral:  Negative for confusion.    Allergies  Allergen Reactions   Ciprofloxacin Hives    Patient Active Problem List   Diagnosis Date Noted   Thrombocytopenia ([place]) 07/17/2020   Other neutropenia (HCC)    Intractable vomiting with nausea 07/16/2020   AKI (acute kidney injury) ([place]) 07/16/2020   Essential hypertension 07/16/2020   Hyperlipidemia 07/16/2020   Chronic GERD 07/16/2020   Psoriatic arthritis ([place]) 07/16/2020   Acute colitis 05/22/2015   Diarrhea 05/22/2015   Hypotension 05/22/2015   Acute posthemorrhagic anemia 05/22/2015   Hyperglycemia 05/22/2015   Rectal bleed 05/19/2015     Past Medical History:  Diagnosis Date   Diverticulitis    Ruptured abscess   GERD (gastroesophageal reflux disease)    GI bleed    Hypercholesteremia    Hypertension      Past Surgical  History:  Procedure Laterality Date   APPENDECTOMY     CHOLECYSTECTOMY     COLONOSCOPY WITH PROPOFOL N/A 09/09/2015   Procedure: COLONOSCOPY WITH PROPOFOL;  Surgeon: Lollie Sails, MD;  Location: [facility] ENDOSCOPY;  Service: Endoscopy;  Laterality: N/A;   COLOSTOMY     COLOSTOMY REVERSAL     ESOPHAGOGASTRODUODENOSCOPY (EGD) WITH PROPOFOL N/A 09/09/2015   Procedure: ESOPHAGOGASTRODUODENOSCOPY (EGD) WITH PROPOFOL;  Surgeon: Lollie Sails, MD;  Location[facility] END[facility]COPY;  Service: Endoscopy;  Laterality: N/A;   Orthopedic surgeries     [address] of her house and had multiple fractures repaired.     Social History   Socioeconomic History   Marital status: Married    Spouse name: Not on file   Number of children: Not on file   Years of education: Not on file   Highest education level: Not on file  Occupational History   Not on file  Tobacco Use   Smoking status: Never   Smokeless tobacco: Never  Substance and Sexual Activity   Alcohol use: No   Drug use: No   Sexual activity: Not on file  Other Topics Concern   Not on file  Social History Narrative   Lives at home with husband.  Four children and 12 grand.     Social Determinants of Health   Financial Resource Strain: Not on file  Food Insecurity: Not on file  Transportation Needs: Not on file  Physical Activity: Not on file  Stress: Not on file  Social Connections: Not on file  Intimate Partner Violence: Not on file  Family History  Problem Relation Age of Onset   Cancer Mother        Throat and neck  died age 12   CAD Mother 46       Stents   Stroke Father        Died age 2   Breast cancer Neg Hx      Current Facility-Administered Medications:    acetaminophen (TYLENOL) tablet 650 mg, 650 mg, Oral, Q6H PRN, 650 mg at 07/17/20 1352 **OR** acetaminophen (TYLENOL) suppository 650 mg, 650 mg, Rectal, Q6H PRN, Cox, Amy N, DO   alum & mag hydroxide-simeth (MAALOX/MYLANTA) 200-200-20 MG/5ML suspension 30 mL, 30  mL, Oral, Q4H PRN, Elodia Florence., MD   diclofenac Sodium (VOLTAREN) 1 % topical gel 2 g, 2 g, Topical, QID PRN, Elodia Florence., MD   nystatin (MYCOSTATIN) 100000 UNIT/ML suspension 500,000 Units, 5 mL, Mouth/Throat, QID, Cox, Amy N, DO, 500,000 Units at 07/18/20 1006   ondansetron (ZOFRAN) tablet 4 mg, 4 mg, Oral, Q6H PRN **OR** ondansetron (ZOFRAN) injection 4 mg, 4 mg, Intravenous, Q6H PRN, Cox, Amy N, DO, 4 mg at 07/17/20 1352   traMADol (ULTRAM) tablet 50 mg, 50 mg, Oral, Q6H PRN, Cox, Amy N, DO, 50 mg at 07/17/20 2306   Physical exam:  Vitals:   07/18/20 0058 07/18/20 0508 07/18/20 0806 07/18/20 0938  BP: 120/72 118/69 104/68 110/65  Pulse: (!) 106 (!) 101 96 89  Resp: 20 14 16 18   Temp: 99.4 F (37.4 C) 99.3 F (37.4 C) 98.6 F (37 C) 98.8 F (37.1 C)  TempSrc: Oral Oral Oral Oral  SpO2: 93% 94% 93% 94%  Weight:      Height:       Physical Exam Constitutional:      General: She is not in acute distress.    Appearance: She is not diaphoretic.  HENT:     Head: Normocephalic and atraumatic.     Nose: Nose normal.     Mouth/Throat:     Pharynx: No oropharyngeal exudate.  Eyes:     General: No scleral icterus.    Pupils: Pupils are equal, round, and reactive to light.  Cardiovascular:     Rate and Rhythm: Normal rate and regular rhythm.     Heart sounds: No murmur heard. Pulmonary:     Effort: Pulmonary effort is normal. No respiratory distress.     Breath sounds: No rales.  Chest:     Chest wall: No tenderness.  Abdominal:     General: There is no distension.     Palpations: Abdomen is soft.     Tenderness: There is no abdominal tenderness.  Musculoskeletal:        General: Normal range of motion.     Cervical back: Normal range of motion and neck supple.  Skin:    General: Skin is warm and dry.     Findings: No erythema.  Neurological:     Mental Status: She is alert and oriented to person, place, and time.     Cranial Nerves: No cranial  nerve deficit.     Motor: No abnormal muscle tone.     Coordination: Coordination normal.  Psychiatric:        Mood and Affect: Affect normal.       CMP Latest Ref Rng & Units 07/18/2020  Glucose 70 - 99 mg/dL 93  BUN 8 - 23 mg/dL 14  Creatinine 0.44 - 1.00 mg/dL 0.57  Sodium 135 - 145 mmol/L  132(L)  Potassium 3.5 - 5.1 mmol/L 3.9  Chloride 98 - 111 mmol/L 108  CO2 22 - 32 mmol/L 20(L)  Calcium 8.9 - 10.3 mg/dL 7.8(L)  Total Protein 6.5 - 8.1 g/dL 5.0(L)  Total Bilirubin 0.3 - 1.2 mg/dL 0.7  Alkaline Phos 38 - 126 U/L 60  AST 15 - 41 U/L 48(H)  ALT 0 - 44 U/L 38   CBC Latest Ref Rng & Units 07/18/2020  WBC 4.0 - 10.5 K/uL 4.0  Hemoglobin 12.0 - 15.0 g/dL 10.7(L)  Hematocrit 36.0 - 46.0 % 30.7(L)  Platelets 150 - 400 K/uL 80(L)    RADIOGRAPHIC STUDIES: I have personally reviewed the radiological images as listed and agreed with the findings in the report. DG Chest 2 View  Result Date: 07/16/2020 CLINICAL DATA:  Sepsis EXAM: CHEST - 2 VIEW COMPARISON:  01/08/2019 FINDINGS: Cardiac shadow is within normal limits. Scarring in the left base is seen. Lungs are well aerated bilaterally. Extrinsic artifact is noted over the apices bilaterally. No focal infiltrate or effusion is seen. No acute bony abnormality is noted. Prior surgical fixation in the right humerus is noted. IMPRESSION: No acute abnormality noted.  Left basilar scarring is seen Electronically Signed   By: Inez Catalina M.D.   On: 07/16/2020 11:58   CT Head Wo Contrast  Result Date: 07/16/2020 CLINICAL DATA:  Facial trauma and headaches, initial encounter EXAM: CT HEAD WITHOUT CONTRAST TECHNIQUE: Contiguous axial images were obtained from the base of the skull through the vertex without intravenous contrast. COMPARISON:  10/27/2015 FINDINGS: Brain: No evidence of acute infarction, hemorrhage, hydrocephalus, extra-axial collection or mass lesion/mass effect. Mild atrophic changes are noted commenced with the patient's given  age. Vascular: No hyperdense vessel or unexpected calcification. Skull: Normal. Negative for fracture or focal lesion. Sinuses/Orbits: No acute finding. Other: Right forehead hematoma is seen. IMPRESSION: Mild atrophic changes without acute intracranial abnormality. Right forehead hematoma Electronically Signed   By: Inez Catalina M.D.   On: 07/16/2020 11:46   CT ABDOMEN PELVIS W CONTRAST  Result Date: 07/16/2020 CLINICAL DATA:  Flank pain, diarrhea, vomiting after starting antibiotic for UTI EXAM: CT ABDOMEN AND PELVIS WITH CONTRAST TECHNIQUE: Multidetector CT imaging of the abdomen and pelvis was performed using the standard protocol following bolus administration of intravenous contrast. CONTRAST:  56mL OMNIPAQUE IOHEXOL 300 MG/ML  SOLN COMPARISON:  2017 FINDINGS: Lower chest: Basilar atelectasis/scarring. Hepatobiliary: No focal liver abnormality is seen. Status post cholecystectomy. No biliary dilatation. Pancreas: Unremarkable. No pancreatic ductal dilatation or surrounding inflammatory changes. Spleen: Normal in size without focal abnormality. Adrenals/Urinary Tract: Adrenals are unremarkable. Few small right renal cysts and too small to characterize low-density lesions. Bladder is unremarkable. Stomach/Bowel: Stomach is unremarkable.  Bowel is normal in caliber. Vascular/Lymphatic: Aortic atherosclerosis. No enlarged lymph nodes. Reproductive: Uterus and bilateral adnexa are unremarkable. Other: No free fluid.  No acute abnormality of the abdominal wall. Musculoskeletal: Levocurvature of the lumbar spine. Degenerative changes of the lumbar spine. No acute osseous abnormality. IMPRESSION: No acute abnormality. No evidence of ascending urinary tract infection. Aortic atherosclerosis. Electronically Signed   By: Macy Mis M.D.   On: 07/16/2020 13:36    Assessment and plan-   #Acute thrombocytopenia and neutropenia. Pathology smear showed no evidence of circulating blasts or  schistocytes. Inappropriately normal immature platelet fraction indicating decreased production-marrow suppression. Possible etiology includes marrow suppression secondary to acute infection. Neutropenia has resolved. Thrombocytopenia has improved.  Normal folate and vitamin B12.  High ferritin level likely due to acute  phase reactant   #Acute diarrhea,   CT showed no acute abnormality.   Rule out infectious etiology.  pending GI PCR, C. Difficile. Diarrhea has resolved.   She can follow up with me in clinic in 2-3 weeks. Discussed with Dr.Powell.  Thank you for allowing me to participate in the care of this patient.   Earlie Server, MD, PhD Hematology Oncology [facility] at [facility] Pager- [phone] 07/18/2020

## 2020-07-18 NOTE — Progress Notes (Signed)
Kynadie P Bondarenko to be D/C'd Home per MD order.  Discussed prescriptions and follow up appointments with the patient. Prescriptions given to patient, medication list explained in detail. Pt verbalized understanding.  Allergies as of 07/18/2020       Reactions   Ciprofloxacin Hives        Medication List     STOP taking these medications    benazepril 10 MG tablet Commonly known as: LOTENSIN   lisinopril 2.5 MG tablet Commonly known as: ZESTRIL       TAKE these medications    Calcium Carbonate-Vitamin D 600-400 MG-UNIT tablet Take 1 tablet by mouth daily.   celecoxib 200 MG capsule Commonly known as: CELEBREX Take 200 mg by mouth 2 (two) times daily.   cyanocobalamin 1000 MCG tablet Take by mouth.   ezetimibe 10 MG tablet Commonly known as: ZETIA Take 10 mg by mouth daily.   Multi-Vitamin tablet Take 1 tablet by mouth daily.   nystatin 100000 UNIT/ML suspension Commonly known as: MYCOSTATIN Use as directed 5 mLs (500,000 Units total) in the mouth or throat 4 (four) times daily for 7 days.   traMADol 50 MG tablet Commonly known as: Ultram Take 1 tablet (50 mg total) by mouth every 6 (six) hours as needed.        Vitals:   07/18/20 1504 07/18/20 1551  BP: (!) 97/51 (!) 93/51  Pulse: 94 96  Resp: 17 16  Temp: 98.2 F (36.8 C) 99.3 F (37.4 C)  SpO2: 91% 92%    Skin clean, dry and intact without evidence of skin break down, no evidence of skin tears noted. IV catheter discontinued intact. Site without signs and symptoms of complications. Dressing and pressure applied. Pt denies pain at this time. No complaints noted.  An After Visit Summary was printed and given to the patient. Patient escorted via [place], and D/C home via private auto.  Rolley Sims

## 2020-07-18 NOTE — Evaluation (Signed)
Physical Therapy Evaluation Patient Details Name: Brandy Haley MRN: [id number] DOB: May 07, 1955 Today's Date: 07/18/2020   History of Present Illness  Pt is a 65  y/o F admitted on 07/16/20 with c/c of diarrhea & abdominal pain. Pt being treated for diarrhea & sepsis. PMH: HTN, GERD, HLD, Raynaud's disease, diverticulosis, herpes zoster, psoriatic arthritis, psoriasis, seronegative spondyloarthropathy  Clinical Impression  Pt seen for PT evaluation with husband present for session. Pt is independent without AD prior to admission but on this date requires supervision & extra effort & time to complete bed mobility, CGA for gait without AD. Pt with c/o weakness during gait & visibly doesn't appear to feel well so further activity deferred.   BP checked in LUE: Supine: 84/49 mmHg (MAP 60), HR 93 bpm Sitting: 103/66 mmHg (MAP 79), HR 98 bpm Standing at 0: 111/65 mmHg (MAP 79), HR 98 bpm Back in bed at end of session: 133/72 mmHg (MAP 90), HR 105 bpm No c/o adverse symptoms during session.      Follow Up Recommendations Home health PT;Supervision for mobility/OOB    Equipment Recommendations  None recommended by PT    Recommendations for Other Services       Precautions / Restrictions Precautions Precautions: Fall Restrictions Weight Bearing Restrictions: No      Mobility  Bed Mobility Overal bed mobility: Needs Assistance Bed Mobility: Supine to Sit;Sit to Supine     Supine to sit: Supervision;HOB elevated Sit to supine: Supervision;HOB elevated   General bed mobility comments: extra time to complete movements, visible effort required    Transfers Overall transfer level: Needs assistance   Transfers: Sit to/from Stand Sit to Stand: Supervision            Ambulation/Gait Ambulation/Gait assistance: Min guard Gait Distance (Feet): 100 Feet Assistive device: None Gait Pattern/deviations: Decreased stride length Gait velocity: decreased      Stairs             Wheelchair Mobility    Modified Rankin (Stroke Patients Only)       Balance Overall balance assessment: Needs assistance Sitting-balance support: Feet supported;Bilateral upper extremity supported Sitting balance-Leahy Scale: Good     Standing balance support: No upper extremity supported;During functional activity Standing balance-Leahy Scale: Fair                               Pertinent Vitals/Pain Pain Assessment: 0-10 Pain Score: 5  Pain Location: between B shoulder blades (pt reports she may have pulled a muscle when transferring during CT scan) Pain Descriptors / Indicators: Aching Pain Intervention(s): Monitored during session (notified RN)    Home Living Family/patient expects to be discharged to:: Private residence Living Arrangements: Spouse/significant other Available Help at Discharge: Family;Available 24 hours/day Type of Home: House Home Access: Level entry     Home Layout: Multi-level;Able to live on main level with bedroom/bathroom Home Equipment: None      Prior Function Level of Independence: Independent         Comments: no AD, reports passing out prior to admission, otherwise no falls     Hand Dominance        Extremity/Trunk Assessment   Upper Extremity Assessment Upper Extremity Assessment: Generalized weakness    Lower Extremity Assessment Lower Extremity Assessment: Generalized weakness       Communication      Cognition Arousal/Alertness: Awake/alert Behavior During Therapy: Flat affect Overall Cognitive Status: Within Functional Limits for  tasks assessed                                 General Comments: Pleasant lady but visibly not feeling well but reports she feels okay      General Comments General comments (skin integrity, edema, etc.): Discussed potential of using RW at home for extra support when pt feels weak    Exercises     Assessment/Plan    PT Assessment Patient needs  continued PT services  PT Problem List Decreased strength;Decreased mobility;Decreased activity tolerance;Decreased balance       PT Treatment Interventions DME instruction;Therapeutic exercise;Gait training;Balance training;Stair training;Functional mobility training;Neuromuscular re-education;Therapeutic activities;Patient/family education    PT Goals (Current goals can be found in the Care Plan section)  Acute Rehab PT Goals Patient Stated Goal: feel better PT Goal Formulation: With patient/family Time For Goal Achievement: 08/01/20 Potential to Achieve Goals: Good    Frequency Min 2X/week   Barriers to discharge        Co-evaluation               AM-PAC PT "6 Clicks" Mobility  Outcome Measure Help needed turning from your back to your side while in a flat bed without using bedrails?: None Help needed moving from lying on your back to sitting on the side of a flat bed without using bedrails?: A Little Help needed moving to and from a bed to a chair (including a wheelchair)?: A Little Help needed standing up from a chair using your arms (e.g., wheelchair or bedside chair)?: A Little Help needed to walk in hospital room?: A Little Help needed climbing 3-5 steps with a railing? : A Lot 6 Click Score: 18    End of Session   Activity Tolerance: Patient tolerated treatment well Patient left: in bed;with call bell/phone within reach;with bed alarm set;with family/visitor present Nurse Communication:  (pain) PT Visit Diagnosis: Muscle weakness (generalized) (M62.81);Difficulty in walking, not elsewhere classified (R26.2)    Time: 6060-0459 PT Time Calculation (min) (ACUTE ONLY): 24 min   Charges:   PT Evaluation $PT Eval Low Complexity: 1 Low PT Treatments $Therapeutic Activity: 8-22 mins        Lavone Nian, PT, DPT 07/18/20, 1:01 PM   Brandy Haley 07/18/2020, 12:58 PM

## 2020-07-18 NOTE — Discharge Summary (Signed)
Physician Discharge Summary  Brandy Haley WNU:[id number] DOB: 12/06/1955 DOA: 07/16/2020  PCP: Brandy Lighter, MD  Admit date: 07/16/2020 Discharge date: 07/18/2020  Time spent: 40 minutes  Recommendations for Outpatient Follow-up:  Follow outpatient CBC/CMP GI path panel pending at discharge Urine culture pending at discharge Follow with oncology outpatient  Follow iron deficiency outpatient, elevated ferritin - would repeat ferritin outpatient outside of acute illness as well as iron studies  Ace inhibitor stopped on discharge, follow with PCP regarding whether to resume  Discharge Diagnoses:  Principal Problem:   Intractable vomiting with nausea Active Problems:   Acute colitis   Diarrhea   Hyperglycemia   AKI (acute kidney injury) ([place])   Essential hypertension   Hyperlipidemia   Chronic GERD   Psoriatic arthritis ([place])   Thrombocytopenia ([place])   Other neutropenia ([place])   Discharge Condition: stable  Diet recommendation: heart healthy  Filed Weights   07/16/20 1118  Weight: 54 kg    History of present illness:  65 yo F with hx HTN, GERD, HLD, raynuad's disease, diverticulosis, zoster, psoriatic arthhritis, psoriasis, seronegative spondyloarthropathy on cimzia who presented to the ED with diarrhea.  She'd recently been started on abx for Rhilyn Haley UTI and otitis media on 6/27.  She'd had abx prior to that on 6/9 for an ear infection and bronchitis.  She was admitted for diarrhea with systemic symptoms including fever, tachycardia, meeting criteria for sepsis.  She improved with conservative measures off abx.  She also had neutropenia and pancytopenia which were improving at the time of d/c (oncology was c/s during her stay).  Stable for discharge on 7/1, see below for additional details.  Hospital Course:  # Diarrhea  Sepsis  - tachy, fever, leukopenia with GI source with diarrhea (?possibly viral gastroenteritis vs enteritis) - follow GI path panel and C diff PCR - c  diff discontinued as not having diarrhea - GI pathogen panel sent on day of discharge - s/p abx in ED, continue to hold - while determining whether source needs abx - tramadol for pain, zofran nausea - follow blood cultures NGTD - UA with pyuria, nitrite positive - follow urine culture (pending) - she's asymptomatic   # Pancytopenia  Neutropenia - neutropenia resolved - thrombocytopenia improving - possible that all this is reactive, related to suspected infectious etiology/sepsis above - heme c/s, appreciate recs -> thought likely marrow suppression in setting of acute infection - follow smear (no blasts or schistocytes), reticulocytes (1.3%), LDH (elevated), iron (low), b12 (elevated - recommended discuss with primary prescriber), folate (wnl), ferritin (elevated - per oncology, likely acute phase reactant)   # Chest Discomfort - follow troponin (relatively flat - not c/w ACS), EKG without concerning findings  # Acute kidney injury - resolved with IVF   # Hypertension - hold home ace - follow with PCP regarding whether to resume  # Hyperlipidemia- hold zetia   # Psoriatic arthritis # Seronegative spondyloarthropathy - Follow-up with rheumatology outpatient -- cimzia   # CT evidence of aortic atherosclerosis-outpatient follow-up -Patient is not having chest pain and/or shortness of breath at this time   06/26/2020: Left ear infection and acute bronchitis was sent home with Brandy Haley Z-Pak, prednisone 20 mg daily for 4 days   07/14/2020: Acute cystitis, cefdinir 300 mg twice daily, 7 days and phenazopyridine    Procedures: none  Consultations: heme  Discharge Exam: Vitals:   07/18/20 1504 07/18/20 1551  BP: (!) 97/51 (!) 93/51  Pulse: 94 96  Resp: 17 16  Temp: 98.2 F (36.8 C) 99.3 F (37.4 C)  SpO2: 91% 92%   No new complaints Feeling Brandy Haley little better Discussed d/c and she's comfortable with this  General: No acute distress. Cardiovascular: Heart sounds show Brandy Haley regular  rate, and rhythm Lungs: Clear to auscultation bilaterally Abdomen: Soft, nontender, nondistended Neurological: Alert and oriented 3. Moves all extremities 4 . Cranial nerves II through XII grossly intact. Skin: Warm and dry. No rashes or lesions. Extremities: No clubbing or cyanosis. No edema.   Discharge Instructions   Discharge Instructions     Call MD for:  difficulty breathing, headache or visual disturbances   Complete by: As directed    Call MD for:  extreme fatigue   Complete by: As directed    Call MD for:  hives   Complete by: As directed    Call MD for:  persistant dizziness or light-headedness   Complete by: As directed    Call MD for:  persistant nausea and vomiting   Complete by: As directed    Call MD for:  redness, tenderness, or signs of infection (pain, swelling, redness, odor or green/yellow discharge around incision site)   Complete by: As directed    Call MD for:  severe uncontrolled pain   Complete by: As directed    Call MD for:  temperature >100.4   Complete by: As directed    Diet - low sodium heart healthy   Complete by: As directed    Discharge instructions   Complete by: As directed    You were seen for diarrhea and infectious symptoms with acute kidney injury and low blood counts (platelets and white blood cells).  It's unclear what led to all of your symptoms, but you may have had Brandy Haley gastroenteritis (stomach bug) or diarrhea related to your antibiotics.  Your GI pathogen panel is pending.  You're improving today.  Your platelets remain low, but your white blood cell count has normalized.  Your stool studies have come back negative.  Your blood cultures are negative.  Your urine studies were concerning for infection, but without convincing symptoms, we'll plan to avoid antibiotics at this time (follow up the final results of the urine culture with your PCP and follow up with your PCP if your have concerning symptoms of UTI).  Continue to hold your  benazepril for now.  Please follow up with your PCP regarding whether to restart this.  Your blood pressure has been reasonable off this and you had acute kidney injury at presentation (and the benazepril can sometimes contribute to this).  Your iron studies are low.  Please follow up with your PCP and/or hematologist outpatient for repeat iron studies outside of your acute illness.   Follow up with your PCP early next week for repeat labs.  Follow with hematology as recommended.  Return for new, recurrent, or worsening symptoms.  Please ask your PCP to request records from this hospitalization so they know what was done and what the next steps will be.   Increase activity slowly   Complete by: As directed       Allergies as of 07/18/2020       Reactions   Ciprofloxacin Hives        Medication List     STOP taking these medications    benazepril 10 MG tablet Commonly known as: LOTENSIN   lisinopril 2.5 MG tablet Commonly known as: ZESTRIL       TAKE these medications    Calcium Carbonate-Vitamin  D 600-400 MG-UNIT tablet Take 1 tablet by mouth daily.   celecoxib 200 MG capsule Commonly known as: CELEBREX Take 200 mg by mouth 2 (two) times daily.   cyanocobalamin 1000 MCG tablet Take by mouth.   ezetimibe 10 MG tablet Commonly known as: ZETIA Take 10 mg by mouth daily.   Multi-Vitamin tablet Take 1 tablet by mouth daily.   nystatin 100000 UNIT/ML suspension Commonly known as: MYCOSTATIN Use as directed 5 mLs (500,000 Units total) in the mouth or throat 4 (four) times daily for 7 days.   traMADol 50 MG tablet Commonly known as: Ultram Take 1 tablet (50 mg total) by mouth every 6 (six) hours as needed.       Allergies  Allergen Reactions   Ciprofloxacin Hives      The results of significant diagnostics from this hospitalization (including imaging, microbiology, ancillary and laboratory) are listed below for reference.    Significant Diagnostic  Studies: DG Chest 2 View  Result Date: 07/16/2020 CLINICAL DATA:  Sepsis EXAM: CHEST - 2 VIEW COMPARISON:  01/08/2019 FINDINGS: Cardiac shadow is within normal limits. Scarring in the left base is seen. Lungs are well aerated bilaterally. Extrinsic artifact is noted over the apices bilaterally. No focal infiltrate or effusion is seen. No acute bony abnormality is noted. Prior surgical fixation in the right humerus is noted. IMPRESSION: No acute abnormality noted.  Left basilar scarring is seen Electronically Signed   By: Inez Catalina M.D.   On: 07/16/2020 11:58   CT Head Wo Contrast  Result Date: 07/16/2020 CLINICAL DATA:  Facial trauma and headaches, initial encounter EXAM: CT HEAD WITHOUT CONTRAST TECHNIQUE: Contiguous axial images were obtained from the base of the skull through the vertex without intravenous contrast. COMPARISON:  10/27/2015 FINDINGS: Brain: No evidence of acute infarction, hemorrhage, hydrocephalus, extra-axial collection or mass lesion/mass effect. Mild atrophic changes are noted commenced with the patient's given age. Vascular: No hyperdense vessel or unexpected calcification. Skull: Normal. Negative for fracture or focal lesion. Sinuses/Orbits: No acute finding. Other: Right forehead hematoma is seen. IMPRESSION: Mild atrophic changes without acute intracranial abnormality. Right forehead hematoma Electronically Signed   By: Inez Catalina M.D.   On: 07/16/2020 11:46   CT ABDOMEN PELVIS W CONTRAST  Result Date: 07/16/2020 CLINICAL DATA:  Flank pain, diarrhea, vomiting after starting antibiotic for UTI EXAM: CT ABDOMEN AND PELVIS WITH CONTRAST TECHNIQUE: Multidetector CT imaging of the abdomen and pelvis was performed using the standard protocol following bolus administration of intravenous contrast. CONTRAST:  45mL OMNIPAQUE IOHEXOL 300 MG/ML  SOLN COMPARISON:  2017 FINDINGS: Lower chest: Basilar atelectasis/scarring. Hepatobiliary: No focal liver abnormality is seen. Status post  cholecystectomy. No biliary dilatation. Pancreas: Unremarkable. No pancreatic ductal dilatation or surrounding inflammatory changes. Spleen: Normal in size without focal abnormality. Adrenals/Urinary Tract: Adrenals are unremarkable. Few small right renal cysts and too small to characterize low-density lesions. Bladder is unremarkable. Stomach/Bowel: Stomach is unremarkable.  Bowel is normal in caliber. Vascular/Lymphatic: Aortic atherosclerosis. No enlarged lymph nodes. Reproductive: Uterus and bilateral adnexa are unremarkable. Other: No free fluid.  No acute abnormality of the abdominal wall. Musculoskeletal: Levocurvature of the lumbar spine. Degenerative changes of the lumbar spine. No acute osseous abnormality. IMPRESSION: No acute abnormality. No evidence of ascending urinary tract infection. Aortic atherosclerosis. Electronically Signed   By: Macy Mis M.D.   On: 07/16/2020 13:36    Microbiology: Recent Results (from the past 240 hour(s))  Culture, blood (Routine x 2)     Status: None (Preliminary  result)   Collection Time: 07/16/20 12:06 PM   Specimen: Left Antecubital; Blood  Result Value Ref Range Status   Specimen Description LEFT ANTECUBITAL  Final   Special Requests   Final    Blood Culture adequate volume BOTTLES DRAWN AEROBIC AND ANAEROBIC   Culture   Final    NO GROWTH 2 DAYS Performed at [facility], [address] [place], [place] [address]    Report Status PENDING  Incomplete  Resp Panel by RT-PCR (Flu Haji Delaine&B, Covid) Nasopharyngeal Swab     Status: None   Collection Time: 07/16/20 12:06 PM   Specimen: Nasopharyngeal Swab; Nasopharyngeal(NP) swabs in vial transport medium  Result Value Ref Range Status   SARS Coronavirus 2 by RT PCR NEGATIVE NEGATIVE Final    Comment: (NOTE) SARS-CoV-2 target nucleic acids are NOT DETECTED.  The SARS-CoV-2 RNA is generally detectable in upper respiratory specimens during the acute phase of infection. The lowest concentration  of SARS-CoV-2 viral copies this assay can detect is 138 copies/mL. Jeannene Tschetter negative result does not preclude SARS-Cov-2 infection and should not be used as the sole basis for treatment or other patient management decisions. Betrice Wanat negative result may occur with  improper specimen collection/handling, submission of specimen other than nasopharyngeal swab, presence of viral mutation(s) within the areas targeted by this assay, and inadequate number of viral copies(<138 copies/mL). Saron Tweed negative result must be combined with clinical observations, patient history, and epidemiological information. The expected result is Negative.  Fact Sheet for Patients:  EntrepreneurPulse.com.au  Fact Sheet for Healthcare Providers:  IncredibleEmployment.be  This test is no t yet approved or cleared by the [place] FDA and  has been authorized for detection and/or diagnosis of SARS-CoV-2 by FDA under an Emergency Use Authorization (EUA). This EUA will remain  in effect (meaning this test can be used) for the duration of the COVID-19 declaration under Section 564(b)(1) of the Act, 21 U.S.C.section 360bbb-3(b)(1), unless the authorization is terminated  or revoked sooner.       Influenza Rc [place] by PCR NEGATIVE NEGATIVE Final   Influenza B by PCR NEGATIVE NEGATIVE Final    Comment: (NOTE) The Xpert Xpress SARS-CoV-2/FLU/RSV plus assay is intended as an aid in the diagnosis of influenza from Nasopharyngeal swab specimens and should not be used as Jessamy Torosyan sole basis for treatment. Nasal washings and aspirates are unacceptable for Xpert Xpress SARS-CoV-2/FLU/RSV testing.  Fact Sheet for Patients: EntrepreneurPulse.com.au  Fact Sheet for Healthcare Providers: IncredibleEmployment.be  This test is not yet approved or cleared by the [place] FDA and has been authorized for detection and/or diagnosis of SARS-CoV-2 by FDA under an Emergency Use  Authorization (EUA). This EUA will remain in effect (meaning this test can be used) for the duration of the COVID-19 declaration under Section 564(b)(1) of the Act, 21 U.S.C. section 360bbb-3(b)(1), unless the authorization is terminated or revoked.  Performed at [facility], [address] [place], [place] [address]   Culture, blood (Routine x 2)     Status: None (Preliminary result)   Collection Time: 07/16/20 12:07 PM   Specimen: Right Antecubital; Blood  Result Value Ref Range Status   Specimen Description RIGHT ANTECUBITAL  Final   Special Requests   Final    Blood Culture adequate volume BOTTLES DRAWN AEROBIC AND ANAEROBIC   Culture   Final    NO GROWTH 2 DAYS Performed at [facility], [address] [place],  [address]    Report Status PENDING  Incomplete  Labs: Basic Metabolic Panel: Recent Labs  Lab 07/16/20 1206 07/17/20 0610 07/18/20 0642  NA 135 132* 132*  K 3.8 3.0* 3.9  CL 101 104 108  CO2 23 22 20*  GLUCOSE 123* 112* 93  BUN 32* 17 14  CREATININE 1.32* 0.63 0.57  CALCIUM 9.3 7.6* 7.8*   Liver Function Tests: Recent Labs  Lab 07/16/20 1206 07/18/20 0642  AST 91* 48*  ALT 79* 38  ALKPHOS 111 60  BILITOT 0.8 0.7  PROT 7.1 5.0*  ALBUMIN 3.9 2.6*   Recent Labs  Lab 07/16/20 1206  LIPASE 41   No results for input(s): AMMONIA in the last 168 hours. CBC: Recent Labs  Lab 07/16/20 1206 07/17/20 0506 07/17/20 0610 07/18/20 0642  WBC 2.6* 2.0* 1.9* 4.0  NEUTROABS 1.5* 0.8*  --  1.8  HGB 14.4 11.3* 11.2* 10.7*  HCT 40.9 31.9* 31.8* 30.7*  MCV 95.1 96.4 95.5 96.2  PLT 95* 72* 73* 80*   Cardiac Enzymes: No results for input(s): CKTOTAL, CKMB, CKMBINDEX, TROPONINI in the last 168 hours. BNP: BNP (last 3 results) No results for input(s): BNP in the last 8760 hours.  ProBNP (last 3 results) No results for input(s): PROBNP in the last 8760 hours.  CBG: No results for input(s): GLUCAP in the last 168  hours.     Signed:  Fayrene Helper MD.  [facility] 07/18/2020, 3:52 PM

## 2020-07-19 LAB — URINE CULTURE: Culture: NO GROWTH

## 2020-07-21 LAB — CULTURE, BLOOD (ROUTINE X 2)
Culture: NO GROWTH
Culture: NO GROWTH
Special Requests: ADEQUATE
Special Requests: ADEQUATE

## 2020-08-04 ENCOUNTER — Ambulatory Visit (INDEPENDENT_AMBULATORY_CARE_PROVIDER_SITE_OTHER): Payer: Medicare Other | Admitting: Urology

## 2020-08-04 ENCOUNTER — Ambulatory Visit: Payer: Medicare Other | Admitting: Oncology

## 2020-08-04 ENCOUNTER — Other Ambulatory Visit: Payer: Self-pay

## 2020-08-04 ENCOUNTER — Encounter: Payer: Self-pay | Admitting: Urology

## 2020-08-04 ENCOUNTER — Other Ambulatory Visit: Payer: Medicare Other

## 2020-08-04 VITALS — BP 144/83 | HR 103 | Ht 62.0 in | Wt 119.0 lb

## 2020-08-04 DIAGNOSIS — N390 Urinary tract infection, site not specified: Secondary | ICD-10-CM | POA: Diagnosis not present

## 2020-08-04 DIAGNOSIS — R350 Frequency of micturition: Secondary | ICD-10-CM | POA: Diagnosis not present

## 2020-08-04 LAB — URINALYSIS, COMPLETE
Bilirubin, UA: NEGATIVE
Glucose, UA: NEGATIVE
Ketones, UA: NEGATIVE
Leukocytes,UA: NEGATIVE
Nitrite, UA: NEGATIVE
Protein,UA: NEGATIVE
RBC, UA: NEGATIVE
Specific Gravity, UA: 1.025 (ref 1.005–1.030)
Urobilinogen, Ur: 0.2 mg/dL (ref 0.2–1.0)
pH, UA: 6 (ref 5.0–7.5)

## 2020-08-04 LAB — MICROSCOPIC EXAMINATION
Bacteria, UA: NONE SEEN
Epithelial Cells (non renal): NONE SEEN /hpf (ref 0–10)
RBC, Urine: NONE SEEN /hpf (ref 0–2)

## 2020-08-04 MED ORDER — NITROFURANTOIN MACROCRYSTAL 100 MG PO CAPS: 100.0000 mg | ORAL_CAPSULE | Freq: Every day | ORAL | 11 refills | Status: DC

## 2020-08-04 NOTE — Progress Notes (Addendum)
08/04/2020 9:10 AM   Brandy Haley 07/29/1955 [id number]  Referring provider: Gladstone Lighter, MD [place],  [place] [address]  No chief complaint on file.   HPI: computer issues 2-3 UTI per year; sometimes negative urine; urgency frequency back pain and cloudy urine that improve with antibiotics Normal: continent; no nocturia; void q 2-3 hours Once daily for 6 months worked before No hysterectomy No GU surgery   PMH: Past Medical History:  Diagnosis Date   Diverticulitis    Ruptured abscess   GERD (gastroesophageal reflux disease)    GI bleed    Hypercholesteremia    Hypertension     Surgical History: Past Surgical History:  Procedure Laterality Date   APPENDECTOMY     CHOLECYSTECTOMY     COLONOSCOPY WITH PROPOFOL N/A 09/09/2015   Procedure: COLONOSCOPY WITH PROPOFOL;  Surgeon: Lollie Sails, MD;  Location[facility] ENDOSCOPY;  Service: Endoscopy;  Laterality: N/A;   COLOSTOMY     COLOSTOMY REVERSAL     ESOPHAGOGASTRODUODENOSCOPY (EGD) WITH PROPOFOL N/A 09/09/2015   Procedure: ESOPHAGOGASTRODUODENOSCOPY (EGD) WITH PROPOFOL;  Surgeon: Lollie Sails, MD;  Location: [facility] ENDOSCOPY;  Service: Endoscopy;  Laterality: N/A;   Orthopedic surgeries     [address] of her house and had multiple fractures repaired.     Home Medications:  Allergies as of 08/04/2020       Reactions   Ciprofloxacin Hives        Medication List        Accurate as of August 04, 2020  9:10 AM. If you have any questions, ask your nurse or doctor.          Calcium Carbonate-Vitamin D 600-400 MG-UNIT tablet Take 1 tablet by mouth daily.   celecoxib 200 MG capsule Commonly known as: CELEBREX Take 200 mg by mouth 2 (two) times daily.   cyanocobalamin 1000 MCG tablet Take by mouth.   ezetimibe 10 MG tablet Commonly known as: ZETIA Take 10 mg by mouth daily.   Multi-Vitamin tablet Take 1 tablet by mouth daily.   traMADol 50 MG tablet Commonly known as:  Ultram Take 1 tablet (50 mg total) by mouth every 6 (six) hours as needed.        Allergies:  Allergies  Allergen Reactions   Ciprofloxacin Hives    Family History: Family History  Problem Relation Age of Onset   Cancer Mother        Throat and neck  died age 10   CAD Mother 46       Stents   Stroke Father        Died age 20   Breast cancer Neg Hx     Social History:  reports that she has never smoked. She has never used smokeless tobacco. She reports that she does not drink alcohol and does not use drugs.  ROS:                                        Physical Exam: There were no vitals taken for this visit.  Constitutional:  Alert and oriented, No acute distress. HEENT: [place] AT, moist mucus membranes.  Trachea midline, no masses. Cardiovascular: No clubbing, cyanosis, or edema. Respiratory: Normal respiratory effort, no increased work of breathing. GI: Abdomen is soft, nontender, nondistended, no abdominal masses GU: No CVA tenderness. Mild hypermobility and no SUI Skin: No rashes, bruises or  suspicious lesions. Lymph: No cervical or inguinal adenopathy. Neurologic: Grossly intact, no focal deficits, moving all 4 extremities. Psychiatric: Normal mood and affect.  Laboratory Data: Lab Results  Component Value Date   WBC 4.0 07/18/2020   HGB 10.7 (L) 07/18/2020   HCT 30.7 (L) 07/18/2020   MCV 96.2 07/18/2020   PLT 80 (L) 07/18/2020    Lab Results  Component Value Date   CREATININE 0.57 07/18/2020    No results found for: PSA  No results found for: TESTOSTERONE  Lab Results  Component Value Date   HGBA1C 5.6 05/20/2015    Urinalysis    Component Value Date/Time   COLORURINE AMBER (A) 07/16/2020 1120   APPEARANCEUR CLEAR (A) 07/16/2020 1120   APPEARANCEUR Clear 10/25/2011 1417   LABSPEC 1.025 07/16/2020 1120   LABSPEC 1.020 10/25/2011 1417   PHURINE 5.0 07/16/2020 1120   GLUCOSEU NEGATIVE 07/16/2020 1120   GLUCOSEU  Negative 10/25/2011 1417   HGBUR SMALL (A) 07/16/2020 1120   BILIRUBINUR NEGATIVE 07/16/2020 1120   BILIRUBINUR Negative 10/25/2011 1417   KETONESUR NEGATIVE 07/16/2020 1120   PROTEINUR NEGATIVE 07/16/2020 1120   NITRITE POSITIVE (A) 07/16/2020 1120   LEUKOCYTESUR NEGATIVE 07/16/2020 1120   LEUKOCYTESUR Negative 10/25/2011 1417    Pertinent Imaging: Urine reviewed; c/s sent; chart reviewed; order ultrasound  Assessment & Plan:  see in 6 weeks for cysto and pelvic on macrobid; call if u/sound abnormal  1. Urinary frequency  - Urinalysis, Complete   No follow-ups on file.  Reece Packer, MD  [facility] [address] [place], [place] [address] [phone]

## 2020-08-04 NOTE — Addendum Note (Signed)
Addended by: Alvera Novel on: 08/04/2020 09:37 AM   Modules accepted: Orders

## 2020-08-04 NOTE — Patient Instructions (Signed)
Cystoscopy Cystoscopy is a procedure that is used to help diagnose and sometimes treat conditions that affect the lower urinary tract. The lower urinary tract includes the bladder and the urethra. The urethra is the tube that drains urine from the bladder. Cystoscopy is done using a thin, tube-shaped instrument with a light and camera at the end (cystoscope). The cystoscope may be hard or flexible, depending on the goal of the procedure. The cystoscope is inserted through the urethra, into the bladder. Cystoscopy may be recommended if you have: Urinary tract infections that keep coming back. Blood in the urine (hematuria). An inability to control when you urinate (urinary incontinence) or an overactive bladder. Unusual cells found in a urine sample. A blockage in the urethra, such as a urinary stone. Painful urination. An abnormality in the bladder found during an intravenous pyelogram (IVP) or CT scan. Cystoscopy may also be done to remove a sample of tissue to be examined under a microscope (biopsy). What are the risks? Generally, this is a safe procedure. However, problems may occur, including: Infection. Bleeding.  What happens during the procedure?  You will be given one or more of the following: A medicine to numb the area (local anesthetic). The area around the opening of your urethra will be cleaned. The cystoscope will be passed through your urethra into your bladder. Germ-free (sterile) fluid will flow through the cystoscope to fill your bladder. The fluid will stretch your bladder so that your health care provider can clearly examine your bladder walls. Your doctor will look at the urethra and bladder. The cystoscope will be removed The procedure may vary among health care providers  What can I expect after the procedure? After the procedure, it is common to have: Some soreness or pain in your abdomen and urethra. Urinary symptoms. These include: Mild pain or burning when you  urinate. Pain should stop within a few minutes after you urinate. This may last for up to 1 week. A small amount of blood in your urine for several days. Feeling like you need to urinate but producing only a small amount of urine. Follow these instructions at home: General instructions Return to your normal activities as told by your health care provider.  Do not drive for 24 hours if you were given a sedative during your procedure. Watch for any blood in your urine. If the amount of blood in your urine increases, call your health care provider. If a tissue sample was removed for testing (biopsy) during your procedure, it is up to you to get your test results. Ask your health care provider, or the department that is doing the test, when your results will be ready. Drink enough fluid to keep your urine pale yellow. Keep all follow-up visits as told by your health care provider. This is important. Contact a health care provider if you: Have pain that gets worse or does not get better with medicine, especially pain when you urinate. Have trouble urinating. Have more blood in your urine. Get help right away if you: Have blood clots in your urine. Have abdominal pain. Have a fever or chills. Are unable to urinate. Summary Cystoscopy is a procedure that is used to help diagnose and sometimes treat conditions that affect the lower urinary tract. Cystoscopy is done using a thin, tube-shaped instrument with a light and camera at the end. After the procedure, it is common to have some soreness or pain in your abdomen and urethra. Watch for any blood in your urine.   If the amount of blood in your urine increases, call your health care provider. If you were prescribed an antibiotic medicine, take it as told by your health care provider. Do not stop taking the antibiotic even if you start to feel better. This information is not intended to replace advice given to you by your health care provider. Make  sure you discuss any questions you have with your health care provider. Document Revised: 12/27/2017 Document Reviewed: 12/27/2017 Elsevier Patient Education  2020 Elsevier Inc.  

## 2020-08-04 NOTE — Addendum Note (Signed)
Addended by: Alvera Novel on: 08/04/2020 09:42 AM   Modules accepted: Orders

## 2020-08-06 ENCOUNTER — Other Ambulatory Visit: Payer: Self-pay

## 2020-08-06 ENCOUNTER — Inpatient Hospital Stay: Payer: Medicare Other

## 2020-08-06 ENCOUNTER — Inpatient Hospital Stay: Payer: Medicare Other | Attending: Oncology | Admitting: Oncology

## 2020-08-06 ENCOUNTER — Encounter: Payer: Self-pay | Admitting: Oncology

## 2020-08-06 VITALS — BP 116/79 | HR 89 | Temp 98.8°F | Resp 18 | Wt 122.6 lb

## 2020-08-06 DIAGNOSIS — D696 Thrombocytopenia, unspecified: Secondary | ICD-10-CM

## 2020-08-06 DIAGNOSIS — D708 Other neutropenia: Secondary | ICD-10-CM | POA: Diagnosis not present

## 2020-08-06 NOTE — Progress Notes (Signed)
Hematology/Oncology Follow Up Note [facility]  Telephone:([phone] [phone]  Patient Care Team: Gladstone Lighter, MD as PCP - General (Internal Medicine)   Name of the patient: Brandy Haley  [id number]  1955-08-09   REASON FOR VISIT Follow-up of hospitalization for thrombocytopenia and neutropenia.  INTERVAL HISTORY Patient was recently admitted to [facility] due to AKI, acute colitis.  It was noted that she has developed pancytopenia and hematology was consulted.  Patient was seen by me during hospitalization.  It was felt that the cytopenias are likely secondary to acute infection.  Her counts starts to improve during hospitalization and she was eventually discharged on 07/18/2020. She presented for a posthospitalization follow-up today. Reports all symptoms have resolved. No fever, chills, nausea vomiting diarrhea.  She had blood work done on 07/22/20 at [place] which is available to me via [facility] Hemoglobin has improved to 11.2, neutrophil normalized,  platelet count has normalized to 384,000.  Review of Systems  Constitutional:  Negative for appetite change, chills, fatigue and fever.  HENT:   Negative for hearing loss and voice change.   Eyes:  Negative for eye problems.  Respiratory:  Negative for chest tightness and cough.   Cardiovascular:  Negative for chest pain.  Gastrointestinal:  Negative for abdominal distention, abdominal pain and blood in stool.  Endocrine: Negative for hot flashes.  Genitourinary:  Negative for difficulty urinating and frequency.   Musculoskeletal:  Negative for arthralgias.  Skin:  Negative for itching and rash.  Neurological:  Negative for extremity weakness.  Hematological:  Negative for adenopathy.  Psychiatric/Behavioral:  Negative for confusion.      Allergies  Allergen Reactions   Cefdinir Nausea And Vomiting   Ciprofloxacin Hives   Pyridium [Phenazopyridine Hcl] Nausea  And Vomiting     Past Medical History:  Diagnosis Date   Diverticulitis    Ruptured abscess   GERD (gastroesophageal reflux disease)    GI bleed    Hypercholesteremia    Hypertension      Past Surgical History:  Procedure Laterality Date   APPENDECTOMY     CHOLECYSTECTOMY     COLONOSCOPY WITH PROPOFOL N/A 09/09/2015   Procedure: COLONOSCOPY WITH PROPOFOL;  Surgeon: Lollie Sails, MD;  Location: [facility] ENDOSCOPY;  Service: Endoscopy;  Laterality: N/A;   COLOSTOMY     COLOSTOMY REVERSAL     ESOPHAGOGASTRODUODENOSCOPY (EGD) WITH PROPOFOL N/A 09/09/2015   Procedure: ESOPHAGOGASTRODUODENOSCOPY (EGD) WITH PROPOFOL;  Surgeon: Lollie Sails, MD;  Location: [facility] ENDOSCOPY;  Service: Endoscopy;  Laterality: N/A;   Orthopedic surgeries     [address] [place] of her house and had multiple fractures repaired.     Social History   Socioeconomic History   Marital status: Married    Spouse name: Not on file   Number of children: Not on file   Years of education: Not on file   Highest education level: Not on file  Occupational History   Not on file  Tobacco Use   Smoking status: Never   Smokeless tobacco: Never  Substance and Sexual Activity   Alcohol use: No   Drug use: No   Sexual activity: Not on file  Other Topics Concern   Not on file  Social History Narrative   Lives at home with husband.  Four children and 12 grand.     Social Determinants of Health   Financial Resource Strain: Not on file  Food Insecurity: Not on file  Transportation Needs: Not on  file  Physical Activity: Not on file  Stress: Not on file  Social Connections: Not on file  Intimate Partner Violence: Not on file    Family History  Problem Relation Age of Onset   Cancer Mother        Throat and neck  died age 35   CAD Mother 51       Stents   Stroke Father        Died age 65   Breast cancer Neg Hx      Current Outpatient Medications:    Calcium Carbonate-Vitamin D 600-400 MG-UNIT tablet, Take 1  tablet by mouth daily., Disp: , Rfl:    celecoxib (CELEBREX) 200 MG capsule, Take 200 mg by mouth 2 (two) times daily., Disp: , Rfl:    Certolizumab Pegol 2 X 200 MG/ML PSKT, Inject into the skin., Disp: , Rfl:    cyanocobalamin 1000 MCG tablet, Take by mouth., Disp: , Rfl:    ezetimibe (ZETIA) 10 MG tablet, Take 10 mg by mouth daily., Disp: , Rfl:    Multiple Vitamin (MULTI-VITAMIN) tablet, Take 1 tablet by mouth daily., Disp: , Rfl:    nitrofurantoin (MACRODANTIN) 100 MG capsule, Take 1 capsule (100 mg total) by mouth daily., Disp: 30 capsule, Rfl: 11   traMADol (ULTRAM) 50 MG tablet, Take 1 tablet (50 mg total) by mouth every 6 (six) hours as needed., Disp: 30 tablet, Rfl: 0  Physical exam:  Vitals:   08/06/20 1019  BP: 116/79  Pulse: 89  Resp: 18  Temp: 98.8 F (37.1 C)  SpO2: 100%  Weight: 122 lb 9.6 oz (55.6 kg)   Physical Exam Constitutional:      General: She is not in acute distress. HENT:     Head: Normocephalic and atraumatic.  Eyes:     General: No scleral icterus. Cardiovascular:     Rate and Rhythm: Normal rate.  Pulmonary:     Effort: Pulmonary effort is normal.  Abdominal:     General: Abdomen is flat.  Musculoskeletal:        General: No deformity. Normal range of motion.     Cervical back: Normal range of motion.  Skin:    Findings: No rash.  Neurological:     Mental Status: She is alert and oriented to person, place, and time. Mental status is at baseline.     Cranial Nerves: No cranial nerve deficit.  Psychiatric:        Mood and Affect: Mood normal.    CMP Latest Ref Rng & Units 07/18/2020  Glucose 70 - 99 mg/dL 93  BUN 8 - 23 mg/dL 14  Creatinine 0.44 - 1.00 mg/dL 0.57  Sodium 135 - 145 mmol/L 132(L)  Potassium 3.5 - 5.1 mmol/L 3.9  Chloride 98 - 111 mmol/L 108  CO2 22 - 32 mmol/L 20(L)  Calcium 8.9 - 10.3 mg/dL 7.8(L)  Total Protein 6.5 - 8.1 g/dL 5.0(L)  Total Bilirubin 0.3 - 1.2 mg/dL 0.7  Alkaline Phos 38 - 126 U/L 60  AST 15 - 41 U/L  48(H)  ALT 0 - 44 U/L 38   CBC Latest Ref Rng & Units 07/18/2020  WBC 4.0 - 10.5 K/uL 4.0  Hemoglobin 12.0 - 15.0 g/dL 10.7(L)  Hematocrit 36.0 - 46.0 % 30.7(L)  Platelets 150 - 400 K/uL 80(L)    RADIOGRAPHIC STUDIES: I have personally reviewed the radiological images as listed and agreed with the findings in the report. DG Chest 2 View  Result Date: 07/16/2020 CLINICAL  DATA:  Sepsis EXAM: CHEST - 2 VIEW COMPARISON:  01/08/2019 FINDINGS: Cardiac shadow is within normal limits. Scarring in the left base is seen. Lungs are well aerated bilaterally. Extrinsic artifact is noted over the apices bilaterally. No focal infiltrate or effusion is seen. No acute bony abnormality is noted. Prior surgical fixation in the right humerus is noted. IMPRESSION: No acute abnormality noted.  Left basilar scarring is seen Electronically Signed   By: Inez Catalina M.D.   On: 07/16/2020 11:58   CT Head Wo Contrast  Result Date: 07/16/2020 CLINICAL DATA:  Facial trauma and headaches, initial encounter EXAM: CT HEAD WITHOUT CONTRAST TECHNIQUE: Contiguous axial images were obtained from the base of the skull through the vertex without intravenous contrast. COMPARISON:  10/27/2015 FINDINGS: Brain: No evidence of acute infarction, hemorrhage, hydrocephalus, extra-axial collection or mass lesion/mass effect. Mild atrophic changes are noted commenced with the patient's given age. Vascular: No hyperdense vessel or unexpected calcification. Skull: Normal. Negative for fracture or focal lesion. Sinuses/Orbits: No acute finding. Other: Right forehead hematoma is seen. IMPRESSION: Mild atrophic changes without acute intracranial abnormality. Right forehead hematoma Electronically Signed   By: Inez Catalina M.D.   On: 07/16/2020 11:46   CT ABDOMEN PELVIS W CONTRAST  Result Date: 07/16/2020 CLINICAL DATA:  Flank pain, diarrhea, vomiting after starting antibiotic for UTI EXAM: CT ABDOMEN AND PELVIS WITH CONTRAST TECHNIQUE:  Multidetector CT imaging of the abdomen and pelvis was performed using the standard protocol following bolus administration of intravenous contrast. CONTRAST:  16mL OMNIPAQUE IOHEXOL 300 MG/ML  SOLN COMPARISON:  2017 FINDINGS: Lower chest: Basilar atelectasis/scarring. Hepatobiliary: No focal liver abnormality is seen. Status post cholecystectomy. No biliary dilatation. Pancreas: Unremarkable. No pancreatic ductal dilatation or surrounding inflammatory changes. Spleen: Normal in size without focal abnormality. Adrenals/Urinary Tract: Adrenals are unremarkable. Few small right renal cysts and too small to characterize low-density lesions. Bladder is unremarkable. Stomach/Bowel: Stomach is unremarkable.  Bowel is normal in caliber. Vascular/Lymphatic: Aortic atherosclerosis. No enlarged lymph nodes. Reproductive: Uterus and bilateral adnexa are unremarkable. Other: No free fluid.  No acute abnormality of the abdominal wall. Musculoskeletal: Levocurvature of the lumbar spine. Degenerative changes of the lumbar spine. No acute osseous abnormality. IMPRESSION: No acute abnormality. No evidence of ascending urinary tract infection. Aortic atherosclerosis. Electronically Signed   By: Macy Mis M.D.   On: 07/16/2020 13:36     Assessment and plan  1. Thrombocytopenia ([place])   2. Other neutropenia ([place])    I reviewed the labs she did on 07/22/2020. Both thrombocytopenia and neutropenia have normalized.  No need for additional work-up at this point. Hemoglobin has improved not yet normalized. I recommend patient to follow-up with primary care physician in the future.  She will be discharged from our clinic    Earlie Server, MD, PhD Hematology Oncology [facility] at [facility] Pager- [phone] 08/06/2020

## 2020-08-07 ENCOUNTER — Telehealth: Payer: Self-pay

## 2020-08-07 LAB — CULTURE, URINE COMPREHENSIVE

## 2020-08-07 MED ORDER — NITROFURANTOIN MONOHYD MACRO 100 MG PO CAPS: 100.0000 mg | ORAL_CAPSULE | Freq: Two times a day (BID) | ORAL | 0 refills | Status: DC

## 2020-08-07 NOTE — Telephone Encounter (Signed)
As per Dr. Matilde Sprang Have the patient take the Macrodantin twice a day for a week and then go back on it once a day. she did have a bladder infection       Sent medication to pharmacy. Pt aware.

## 2020-08-07 NOTE — Telephone Encounter (Signed)
-----   Message from Chrystie Nose, [place] sent at 08/07/2020  2:33 PM EDT -----  ----- Message ----- From: Bjorn Loser, MD Sent: 08/07/2020  12:50 PM EDT To: Chrystie Nose, CMA  Have the patient take the Macrodantin twice a day for a week and then go back on it once a day  she did have a bladder infection   ----- Message ----- From: Chrystie Nose, CMA Sent: 08/06/2020   7:47 AM EDT To: Bjorn Loser, MD   ----- Message ----- From: Interface, [facility] Lab Results In Sent: 08/04/2020   1:36 PM EDT To: Rowe Robert Clinical

## 2020-09-16 ENCOUNTER — Other Ambulatory Visit: Payer: Self-pay | Admitting: Internal Medicine

## 2020-09-16 DIAGNOSIS — Z1231 Encounter for screening mammogram for malignant neoplasm of breast: Secondary | ICD-10-CM

## 2020-09-29 ENCOUNTER — Other Ambulatory Visit: Payer: Self-pay

## 2020-09-29 ENCOUNTER — Ambulatory Visit (INDEPENDENT_AMBULATORY_CARE_PROVIDER_SITE_OTHER): Payer: Medicare Other | Admitting: Urology

## 2020-09-29 VITALS — BP 105/68 | HR 97

## 2020-09-29 DIAGNOSIS — R350 Frequency of micturition: Secondary | ICD-10-CM | POA: Diagnosis not present

## 2020-09-29 MED ORDER — NITROFURANTOIN MONOHYD MACRO 100 MG PO CAPS: 100.0000 mg | ORAL_CAPSULE | Freq: Every day | ORAL | 3 refills | Status: DC

## 2020-09-29 NOTE — Addendum Note (Signed)
Addended by: Alvera Novel on: 09/29/2020 10:36 AM   Modules accepted: Orders

## 2020-09-29 NOTE — Progress Notes (Signed)
09/29/2020 10:21 AM   Brandy Haley 1955/04/23 [id number]  Referring provider: Gladstone Lighter, MD [place],  [place] [address]  Chief Complaint  Patient presents with   Cysto    HPI: back pain and cloudy urine that improve with antibiotics Normal: continent; no nocturia; void q 2-3 hours Once daily for 6 months worked before No hysterectomy No GU surgery   Mild hypermobility and no SUI see in 6 weeks for cysto and pelvic on macrobid; call if u/sound abnormal   Today Last culture positive.  CT scan June 2022 normal.  Frequency stable. No infections on daily Macrobid Cystoscopy: Patient underwent flexible cystoscopy.  Bladder mucosa and trigone normal.  No stitch foreign body or carcinoma.  Tolerated well.     PMH: Past Medical History:  Diagnosis Date   Diverticulitis    Ruptured abscess   GERD (gastroesophageal reflux disease)    GI bleed    Hypercholesteremia    Hypertension     Surgical History: Past Surgical History:  Procedure Laterality Date   APPENDECTOMY     CHOLECYSTECTOMY     COLONOSCOPY WITH PROPOFOL N/A 09/09/2015   Procedure: COLONOSCOPY WITH PROPOFOL;  Surgeon: Lollie Sails, MD;  Location[facility] ENDOSCOPY;  Service: Endoscopy;  Laterality: N/A;   COLOSTOMY     COLOSTOMY REVERSAL     ESOPHAGOGASTRODUODENOSCOPY (EGD) WITH PROPOFOL N/A 09/09/2015   Procedure: ESOPHAGOGASTRODUODENOSCOPY (EGD) WITH PROPOFOL;  Surgeon: Lollie Sails, MD;  Location: [facility] ENDOSCOPY;  Service: Endoscopy;  Laterality: N/A;   Orthopedic surgeries     [address] [place] of her house and had multiple fractures repaired.     Home Medications:  Allergies as of 09/29/2020       Reactions   Cefdinir Nausea And Vomiting   Ciprofloxacin Hives   Pyridium [phenazopyridine Hcl] Nausea And Vomiting        Medication List        Accurate as of September 29, 2020 10:21 AM. If you have any questions, ask your nurse or doctor.          benazepril 10  MG tablet Commonly known as: LOTENSIN Take 10 mg by mouth daily.   Calcium Carbonate-Vitamin D 600-400 MG-UNIT tablet Take 1 tablet by mouth daily.   celecoxib 200 MG capsule Commonly known as: CELEBREX Take 200 mg by mouth 2 (two) times daily.   Certolizumab Pegol 2 X 200 MG/ML Pskt Inject into the skin.   cyanocobalamin 1000 MCG tablet Take by mouth.   ezetimibe 10 MG tablet Commonly known as: ZETIA Take 10 mg by mouth daily.   gabapentin 300 MG capsule Commonly known as: NEURONTIN 1 po qHS   Multi-Vitamin tablet Take 1 tablet by mouth daily.   nitrofurantoin (macrocrystal-monohydrate) 100 MG capsule Commonly known as: MACROBID Take by mouth. What changed: Another medication with the same name was removed. Continue taking this medication, and follow the directions you see here. Changed by: Reece Packer, MD   nitrofurantoin 100 MG capsule Commonly known as: Macrodantin Take 1 capsule (100 mg total) by mouth daily.   traMADol 50 MG tablet Commonly known as: Ultram Take 1 tablet (50 mg total) by mouth every 6 (six) hours as needed.        Allergies:  Allergies  Allergen Reactions   Cefdinir Nausea And Vomiting   Ciprofloxacin Hives   Pyridium [Phenazopyridine Hcl] Nausea And Vomiting    Family History: Family History  Problem Relation Age of Onset   Cancer Mother  Throat and neck  died age 20   CAD Mother 49       Stents   Stroke Father        Died age 18   Breast cancer Neg Hx     Social History:  reports that she has never smoked. She has never used smokeless tobacco. She reports that she does not drink alcohol and does not use drugs.  ROS:                                        Physical Exam: BP 105/68   Pulse 97   Constitutional:  Alert and oriented, No acute distress. HEENT: [place] AT, moist mucus membranes.  Trachea midline, no masses.   Laboratory Data: Lab Results  Component Value Date   WBC 4.0  07/18/2020   HGB 10.7 (L) 07/18/2020   HCT 30.7 (L) 07/18/2020   MCV 96.2 07/18/2020   PLT 80 (L) 07/18/2020    Lab Results  Component Value Date   CREATININE 0.57 07/18/2020    No results found for: PSA  No results found for: TESTOSTERONE  Lab Results  Component Value Date   HGBA1C 5.6 05/20/2015    Urinalysis    Component Value Date/Time   COLORURINE AMBER (A) 07/16/2020 1120   APPEARANCEUR Clear 08/04/2020 0909   LABSPEC 1.025 07/16/2020 1120   LABSPEC 1.020 10/25/2011 1417   PHURINE 5.0 07/16/2020 1120   GLUCOSEU Negative 08/04/2020 0909   GLUCOSEU Negative 10/25/2011 1417   HGBUR SMALL (A) 07/16/2020 1120   BILIRUBINUR Negative 08/04/2020 0909   BILIRUBINUR Negative 10/25/2011 1417   KETONESUR NEGATIVE 07/16/2020 1120   PROTEINUR Negative 08/04/2020 0909   PROTEINUR NEGATIVE 07/16/2020 1120   NITRITE Negative 08/04/2020 0909   NITRITE POSITIVE (A) 07/16/2020 1120   LEUKOCYTESUR Negative 08/04/2020 0909   LEUKOCYTESUR NEGATIVE 07/16/2020 1120   LEUKOCYTESUR Negative 10/25/2011 1417    Pertinent Imaging:   Assessment & Plan: The prescription was renewed and I will see in 1 year  1. Urinary frequency  - Urinalysis, Complete   No follow-ups on file.  Reece Packer, MD  [place] [address] [place], [place] [address] [phone]

## 2020-10-01 LAB — MICROSCOPIC EXAMINATION: Bacteria, UA: NONE SEEN

## 2020-10-01 LAB — URINALYSIS, COMPLETE
Bilirubin, UA: NEGATIVE
Glucose, UA: NEGATIVE
Ketones, UA: NEGATIVE
Leukocytes,UA: NEGATIVE
Nitrite, UA: NEGATIVE
Protein,UA: NEGATIVE
RBC, UA: NEGATIVE
Specific Gravity, UA: <1.005 — ABNORMAL LOW (ref 1.005–1.030)
Urobilinogen, Ur: 0.2 mg/dL (ref 0.2–1.0)
pH, UA: 5.5 (ref 5.0–7.5)

## 2020-10-24 ENCOUNTER — Other Ambulatory Visit: Payer: Self-pay

## 2020-10-24 ENCOUNTER — Ambulatory Visit
Admission: RE | Admit: 2020-10-24 | Discharge: 2020-10-24 | Disposition: A | Discharge status: Home / Self Care | Payer: Medicare Other | Source: Ambulatory Visit | Attending: Internal Medicine | Admitting: Internal Medicine

## 2020-10-24 DIAGNOSIS — Z1231 Encounter for screening mammogram for malignant neoplasm of breast: Secondary | ICD-10-CM

## 2020-12-31 IMAGING — CT CT CERVICAL SPINE W/O CM
3 of 4 series · 9 of 33 positions shown, 11 images · non-contrast
Comparison: None.

CLINICAL DATA: Chronic neck pain along the left side.

EXAM:
CT CERVICAL SPINE WITHOUT CONTRAST
TECHNIQUE: Multidetector CT imaging of the cervical spine was performed without
intravenous contrast. Multiplanar CT image reconstructions were also
generated.

[Series 6: sagittal bone · sagittal · 0.26mm/px · 5 of 42 slices shown, 6 images]
[im 14/42  bone]
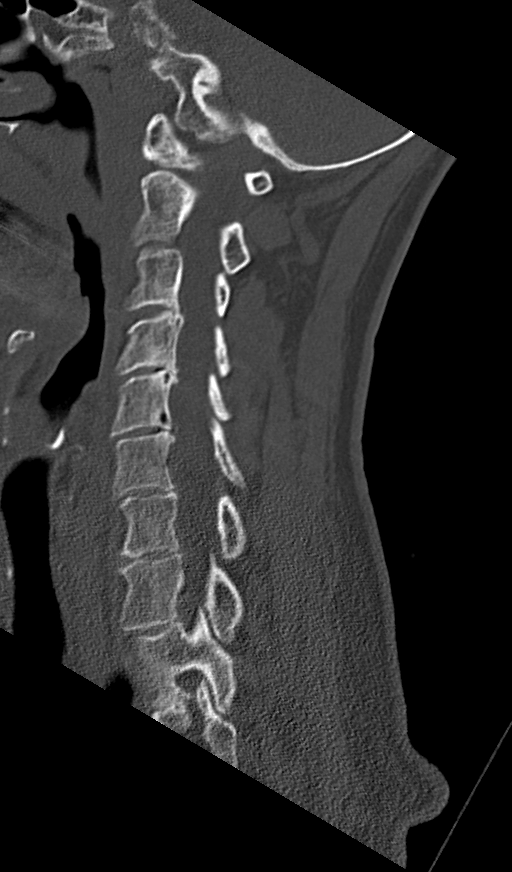
[im 18/42  bone]
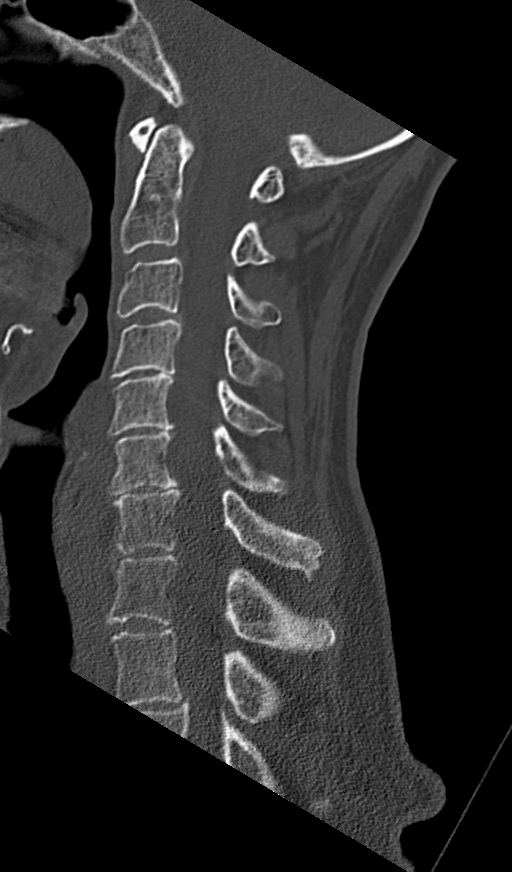
[im 21/42  soft-tissue]
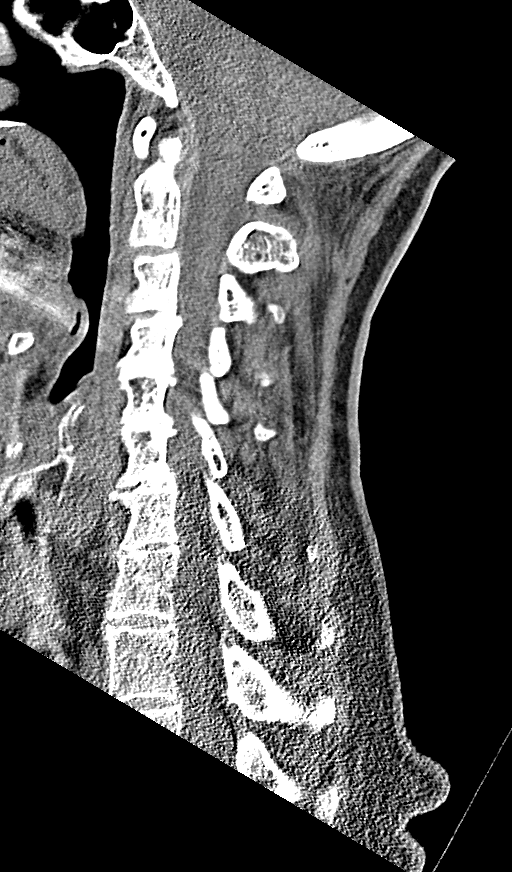
[im 21/42  bone]
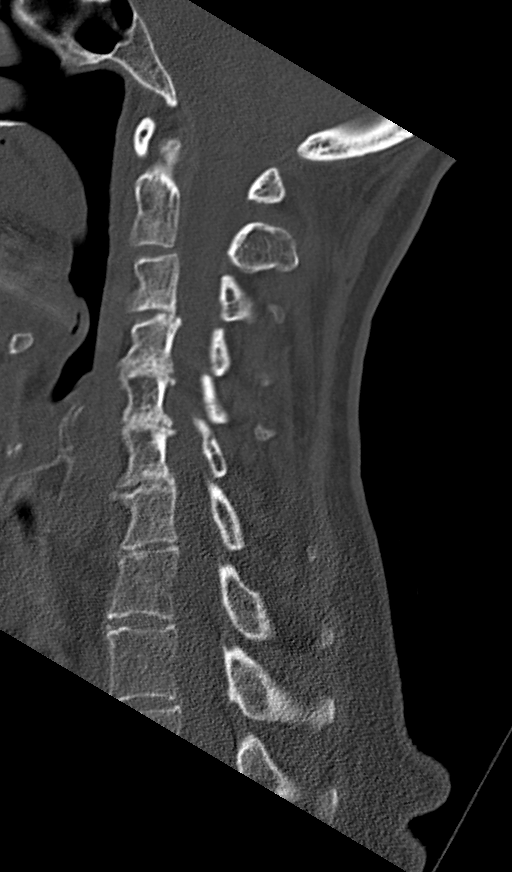
[im 24/42  bone]
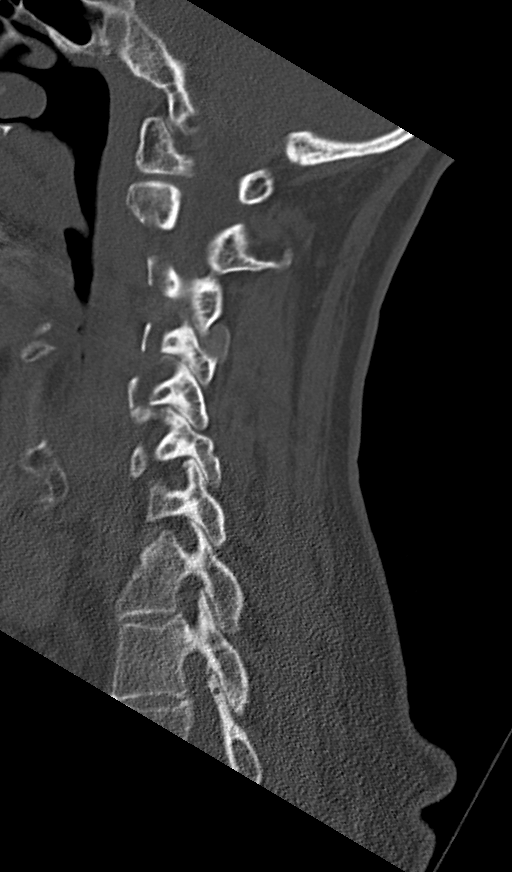
[im 28/42  bone]
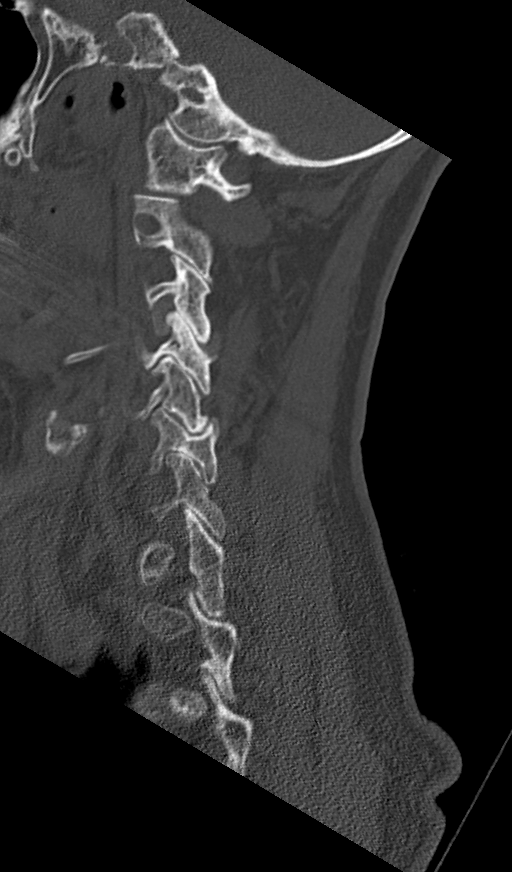

[Series 7: coronal bone · coronal · 0.20mm/px · 3 of 37 slices shown]
[im 8/37  bone]
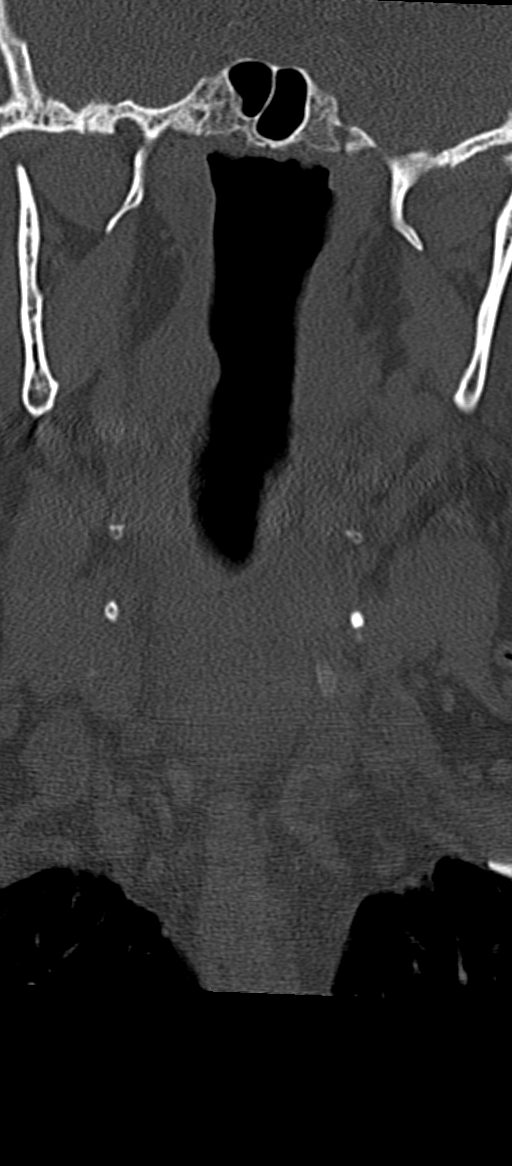
[im 15/37  bone]
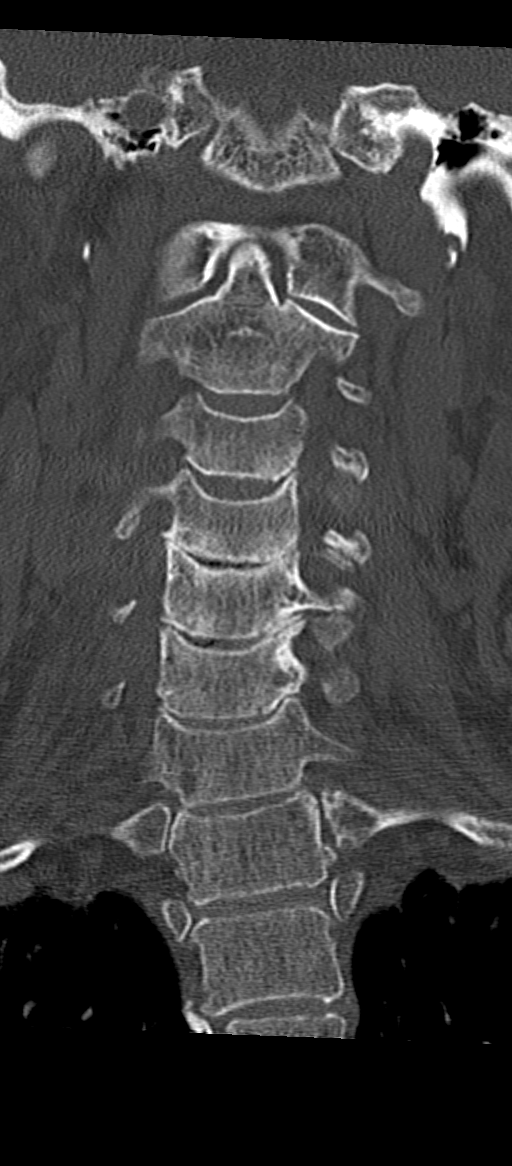
[im 22/37  bone]
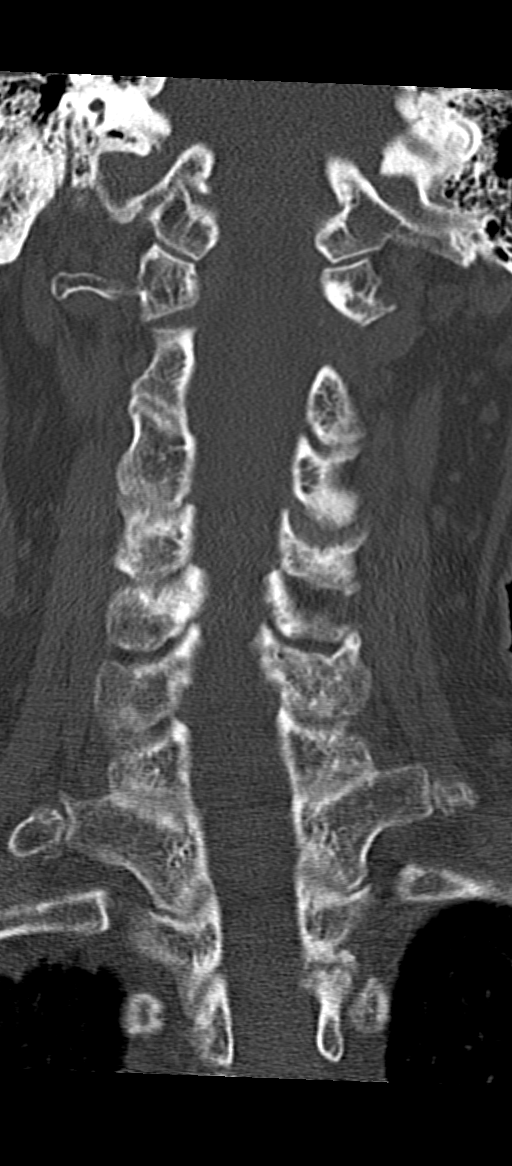

[Series 8: orthogonal bone · axial · 0.20mm/px · z∈[+151,+151]mm · 1 of 92 slices shown, 2 images]
[im 46/92  soft-tissue]
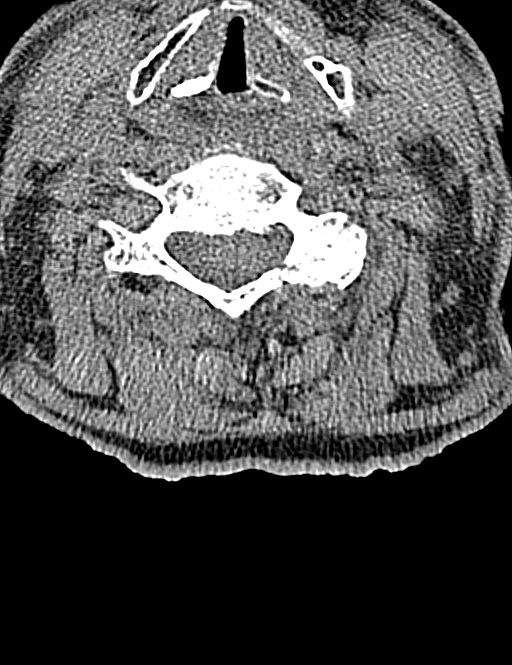
[im 46/92  bone]
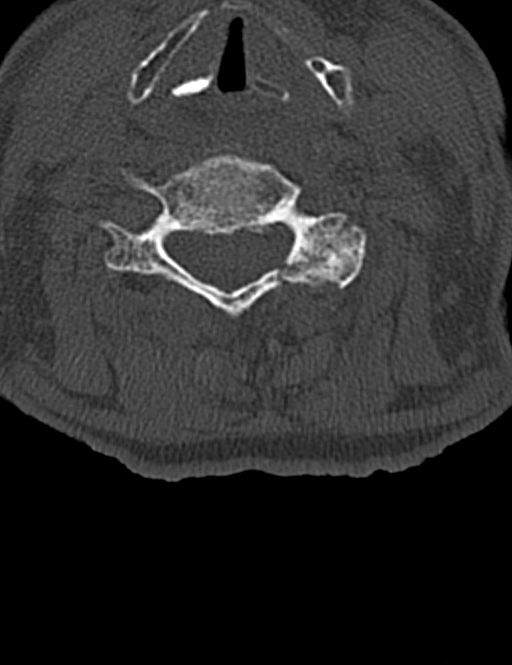

[9 of 33 positions shown; findings below may reference images not displayed]

FINDINGS: Alignment: 2 mm anterolisthesis of C6 on C7 secondary to facet
disease. Loss of the normal cervical lordosis with straightening.

Skull base and vertebrae: No acute fracture. No primary bone lesion
or focal pathologic process.

Soft tissues and spinal canal: No prevertebral fluid or swelling. No
visible canal hematoma.

Disc levels:

Degenerative disease with disc height loss at C4-5, C5-6, C6-7,
C7-T1.

C1-2: Symmetry is preserved between the dens and lateral masses of
C1. The dens and C1 are intact, without evidence of fracture.

C2-3: Disc spaces maintained. No foraminal or central canal
stenosis.

C3-4: Mild broad-based disc osteophyte complex. Severe left facet
arthropathy with a large left facet joint effusion which extends
into the left neural foramen with severe left foraminal stenosis. No
right foraminal stenosis.

C4-5: Broad-based disc osteophyte complex. Moderate bilateral facet
arthropathy. Bilateral foraminal stenosis.

C5-C6: Broad-based disc osteophyte complex. Severe left facet
arthropathy. Left foraminal stenosis. Mild right facet arthropathy.
No right foraminal stenosis.

C6-C7: Broad-based disc osteophyte complex. No foraminal or central
canal stenosis.

C7-T1: No disc protrusion, foraminal stenosis or central canal
stenosis. Mild bilateral facet arthropathy.

Upper chest: Lung apices are clear.

Other: No fluid collection or hematoma.
IMPRESSION: 1. Cervical spine spondylosis as described above.
2. No acute osseous injury of the cervical spine.

## 2021-01-26 ENCOUNTER — Other Ambulatory Visit: Payer: Self-pay

## 2021-01-26 ENCOUNTER — Emergency Department
Admission: EM | Admit: 2021-01-26 | Discharge: 2021-01-26 | Disposition: A | Discharge status: Home / Self Care | Payer: Medicare Other | Attending: Emergency Medicine | Admitting: Emergency Medicine

## 2021-01-26 DIAGNOSIS — T7840XA Allergy, unspecified, initial encounter: Secondary | ICD-10-CM | POA: Diagnosis not present

## 2021-01-26 MED ORDER — EPINEPHRINE 0.3 MG/0.3ML IJ SOAJ: 0.3000 mg | INTRAMUSCULAR | 0 refills | Status: DC | PRN

## 2021-01-26 MED ORDER — PREDNISONE 50 MG PO TABS: 50.0000 mg | ORAL_TABLET | Freq: Every day | ORAL | 0 refills | Status: AC

## 2021-01-26 NOTE — ED Notes (Signed)
See triage note  presents from [facility] with possible allergic rxn  pt was given benadryl and steroids prior to arrival  states she developed sxs' of throat closing while getting Remacade

## 2021-01-26 NOTE — ED Triage Notes (Signed)
Pt comes with c/o allergic reaction. Pt was receiving her 4th infusion at [facility]. Caryl Pina RN states pt became diaphoretic and c/o chest tightness and throat. Pt's O2 checked and was 70s.   Pt arrives and placed on Dinamap with O2 reading 88%. Pt placed on 2L [place] and now at 100%. Pt able to speak in complete sentences.  Pt was given 125 solumedrol and 50 of benadryl by RN Caryl Pina.

## 2021-01-26 NOTE — ED Provider Notes (Signed)
[facility] Provider Note    Event Date/Time   First MD Initiated Contact with Patient 01/26/21 1015     (approximate)  History   Allergic Reaction   HPI Brandy Haley is a 66 y.o. female who presents from infusion clinic after a presumed allergic reaction while getting her first Remicade infusion.  Patient states that during her preliminary infusions she did experience some posterior headache and intermittent chest pain however this was alleviated by an oral medication that pretreated herself with before these infusions.  Patient states that she did take this premedication today as well however during the infusion she began feeling the sensation that her throat was closing up and that she was having difficulty breathing.  Patient denies any rash, palpitations, chest pain, shortness of breath, nausea/vomiting.  Patient states that she has known allergies to Cipro and cefaclor but no food allergies.  Patient received 125 mg Solu-Medrol and 50 mg IV Benadryl with complete resolution of her symptoms at the time of my evaluation.     Physical Exam   Triage Vital Signs: ED Triage Vitals  Enc Vitals Group     BP 01/26/21 1011 (!) 162/101     Pulse Rate 01/26/21 1011 97     Resp 01/26/21 1011 19     Temp 01/26/21 1011 98 F (36.7 C)     Temp src --      SpO2 01/26/21 1011 100 %     Weight 01/26/21 1119 122 lb 9.2 oz (55.6 kg)     Height 01/26/21 1119 5\' 2"  (1.575 m)     Head Circumference --      Peak Flow --      Pain Score 01/26/21 1010 3     Pain Loc --      Pain Edu? --      Excl. in [place]? --     Most recent vital signs: Vitals:   01/26/21 1200 01/26/21 1215  BP: 102/74 102/74  Pulse: 77 77  Resp: 14 14  Temp:    SpO2:  99%    General: Awake, no distress.  CV:  Good peripheral perfusion.  Resp:  Normal effort.  Placed on room air with no desaturations Abd:  No distention.  Other:  Elderly Caucasian female laying on stretcher in no distress  with no stridor appreciated on neck exam   ED Results / Procedures / Treatments   Labs (all labs ordered are listed, but only abnormal results are displayed) Labs Reviewed - No data to display   EKG ED ECG REPORT I, Naaman Plummer, the attending physician, personally viewed and interpreted this ECG.  Date: 01/26/2021 EKG Time: 1010 Rate: 94 Rhythm: normal sinus rhythm QRS Axis: normal Intervals: normal ST/T Wave abnormalities: normal Narrative Interpretation: no evidence of acute ischemia  PROCEDURES:  Critical Care performed: No  Procedures   MEDICATIONS ORDERED IN ED: Medications - No data to display   IMPRESSION / MDM / [place] / ED COURSE  I reviewed the triage vital signs and the nursing notes.                              The patient is on the cardiac monitor to evaluate for evidence of arrhythmia and/or significant heart rate changes. + Sensation of throat swelling No evidence of multiorgan involvement Patient received 125 Solu-Medrol and 50 mg Benadryl prior to arrival. Given history and exam,  presentation most consistent with allergic reaction. I have low suspicion for toxic shock syndrome, anaphylaxis, asthma exacerbation, or drug toxicity. Rx: Prednisone 60mg  qday x3days, Benadryl 25mg  q8hr x3days, EpiPen Disposition: Discharge home with SRP. Follow up with PCP in 1-2 days.       FINAL CLINICAL IMPRESSION(S) / ED DIAGNOSES   Final diagnoses:  Allergic reaction, initial encounter     Rx / DC Orders   ED Discharge Orders          Ordered    EPINEPHrine (EPIPEN 2-PAK) 0.3 mg/0.3 mL IJ SOAJ injection  As needed        01/26/21 1243    predniSONE (DELTASONE) 50 MG tablet  Daily with breakfast        01/26/21 1243             Note:  This document was prepared using Dragon voice recognition software and may include unintentional dictation errors.   Naaman Plummer, MD 01/26/21 1249

## 2021-02-16 ENCOUNTER — Other Ambulatory Visit: Payer: Self-pay | Admitting: Physical Medicine and Rehabilitation

## 2021-02-16 ENCOUNTER — Other Ambulatory Visit (HOSPITAL_COMMUNITY): Payer: Self-pay | Admitting: Physical Medicine and Rehabilitation

## 2021-02-16 DIAGNOSIS — M5412 Radiculopathy, cervical region: Secondary | ICD-10-CM

## 2021-02-21 ENCOUNTER — Ambulatory Visit
Admission: RE | Admit: 2021-02-21 | Discharge: 2021-02-21 | Disposition: A | Discharge status: Home / Self Care | Payer: Medicare Other | Source: Ambulatory Visit | Attending: Physical Medicine and Rehabilitation | Admitting: Physical Medicine and Rehabilitation

## 2021-02-21 DIAGNOSIS — M5412 Radiculopathy, cervical region: Secondary | ICD-10-CM | POA: Diagnosis present

## 2021-02-23 ENCOUNTER — Other Ambulatory Visit: Payer: Self-pay | Admitting: Physical Medicine and Rehabilitation

## 2021-02-23 ENCOUNTER — Other Ambulatory Visit (HOSPITAL_COMMUNITY): Payer: Self-pay | Admitting: Physical Medicine and Rehabilitation

## 2021-02-23 DIAGNOSIS — R9389 Abnormal findings on diagnostic imaging of other specified body structures: Secondary | ICD-10-CM

## 2021-02-23 DIAGNOSIS — M5412 Radiculopathy, cervical region: Secondary | ICD-10-CM

## 2021-02-24 ENCOUNTER — Ambulatory Visit
Admission: RE | Admit: 2021-02-24 | Discharge: 2021-02-24 | Disposition: A | Discharge status: Home / Self Care | Payer: Medicare Other | Source: Ambulatory Visit | Attending: Physical Medicine and Rehabilitation | Admitting: Physical Medicine and Rehabilitation

## 2021-02-24 ENCOUNTER — Other Ambulatory Visit: Payer: Self-pay

## 2021-02-24 DIAGNOSIS — R9389 Abnormal findings on diagnostic imaging of other specified body structures: Secondary | ICD-10-CM | POA: Insufficient documentation

## 2021-02-24 DIAGNOSIS — M5412 Radiculopathy, cervical region: Secondary | ICD-10-CM | POA: Insufficient documentation

## 2021-03-24 ENCOUNTER — Encounter: Admission: RE | Payer: Self-pay | Source: Home / Self Care

## 2021-03-24 ENCOUNTER — Ambulatory Visit: Admission: RE | Admit: 2021-03-24 | Payer: Medicare Other | Source: Home / Self Care

## 2021-03-24 SURGERY — COLONOSCOPY WITH PROPOFOL
Anesthesia: General

## 2021-10-05 ENCOUNTER — Encounter: Payer: Self-pay | Admitting: Urology

## 2021-10-05 ENCOUNTER — Ambulatory Visit (INDEPENDENT_AMBULATORY_CARE_PROVIDER_SITE_OTHER): Payer: Medicare Other | Admitting: Urology

## 2021-10-05 VITALS — BP 129/76 | HR 88 | Ht 62.0 in | Wt 111.0 lb

## 2021-10-05 DIAGNOSIS — R350 Frequency of micturition: Secondary | ICD-10-CM

## 2021-10-05 MED ORDER — NITROFURANTOIN MACROCRYSTAL 100 MG PO CAPS: 100.0000 mg | ORAL_CAPSULE | Freq: Every day | ORAL | 11 refills | Status: DC

## 2021-10-05 NOTE — Progress Notes (Signed)
10/05/2021 10:13 AM   Brandy Haley 10-Mar-1955 [id number]  Referring provider: Gladstone Lighter, MD [place],  [place] [address]  Chief Complaint  Patient presents with   Urinary Frequency    1 year follow-up    HPI: back pain and cloudy urine that improve with antibiotics Normal: continent; no nocturia; void q 2-3 hours Once daily for 6 months worked before No hysterectomy No GU surgery   Mild hypermobility and no SUI see in 6 weeks for cysto and pelvic on macrobid; call if u/sound abnormal   Today Last culture positive.  CT scan June 2022 normal.  Frequency stable. No infections on daily Macrobid Cystoscopy: Patient underwent flexible cystoscopy.  Bladder mucosa and trigone normal.  No stitch foreign body or carcinoma.  Tolerated well.    TOday She had complicated cervical spine surgery that fortunately did not compromise an artery to her brain.  She did get a infection a month ago and they stopped her Macrodantin.  Infection free now.  Frequency stable  PMH: Past Medical History:  Diagnosis Date   Diverticulitis    Ruptured abscess   GERD (gastroesophageal reflux disease)    GI bleed    Hypercholesteremia    Hypertension     Surgical History: Past Surgical History:  Procedure Laterality Date   APPENDECTOMY     CHOLECYSTECTOMY     COLONOSCOPY WITH PROPOFOL N/A 09/09/2015   Procedure: COLONOSCOPY WITH PROPOFOL;  Surgeon: Lollie Sails, MD;  Location: [facility] ENDOSCOPY;  Service: Endoscopy;  Laterality: N/A;   COLOSTOMY     COLOSTOMY REVERSAL     ESOPHAGOGASTRODUODENOSCOPY (EGD) WITH PROPOFOL N/A 09/09/2015   Procedure: ESOPHAGOGASTRODUODENOSCOPY (EGD) WITH PROPOFOL;  Surgeon: Lollie Sails, MD;  [facility]: [facility] ENDOSCOPY;  Service: Endoscopy;  Laterality: N/A;   Orthopedic surgeries     [address] [place] of her house and had multiple fractures repaired.     Home Medications:  Allergies as of 10/05/2021       Reactions   Cefdinir Nausea  And Vomiting   Ciprofloxacin Hives   Infliximab    Other reaction(s): Other (See Comments) "Went into shock"   Pyridium [phenazopyridine Hcl] Nausea And Vomiting        Medication List        Accurate as of October 05, 2021 10:13 AM. If you have any questions, ask your nurse or doctor.          STOP taking these medications    nitrofurantoin (macrocrystal-monohydrate) 100 MG capsule Commonly known as: MACROBID Stopped by: Reece Packer, MD   nitrofurantoin 100 MG capsule Commonly known as: Macrodantin Stopped by: Reece Packer, MD       TAKE these medications    benazepril 10 MG tablet Commonly known as: LOTENSIN Take 10 mg by mouth daily.   Calcium Carbonate-Vitamin D 600-400 MG-UNIT tablet Take 1 tablet by mouth daily.   celecoxib 200 MG capsule Commonly known as: CELEBREX Take 200 mg by mouth 2 (two) times daily.   certolizumab pegol 2 X 200 MG/ML Pskt Commonly known as: CIMZIA Inject into the skin.   cyanocobalamin 1000 MCG tablet Take by mouth.   EPINEPHrine 0.3 mg/0.3 mL Soaj injection Commonly known as: EpiPen 2-Pak Inject 0.3 mg into the muscle as needed for anaphylaxis.   ezetimibe 10 MG tablet Commonly known as: ZETIA Take 10 mg by mouth daily.   gabapentin 300 MG capsule Commonly known as: NEURONTIN 1 po qHS   Multi-Vitamin tablet Take 1  tablet by mouth daily.   traMADol 50 MG tablet Commonly known as: Ultram Take 1 tablet (50 mg total) by mouth every 6 (six) hours as needed.        Allergies:  Allergies  Allergen Reactions   Cefdinir Nausea And Vomiting   Ciprofloxacin Hives   Infliximab     Other reaction(s): Other (See Comments) "Went into shock"   Pyridium [Phenazopyridine Hcl] Nausea And Vomiting    Family History: Family History  Problem Relation Age of Onset   Cancer Mother        Throat and neck  died age 32   CAD Mother 68       Stents   Stroke Father        Died age 80   Breast cancer Neg  Hx     Social History:  reports that she has never smoked. She has never been exposed to tobacco smoke. She has never used smokeless tobacco. She reports that she does not drink alcohol and does not use drugs.  ROS:                                        Physical Exam: BP 129/76   Pulse 88   Ht '5\' 2"'$  (1.575 m)   Wt 50.3 kg   BMI 20.30 kg/m   Constitutional:  Alert and oriented, No acute distress. HEENT: [place] AT, moist mucus membranes.  Trachea midline, no masses.  Laboratory Data: Lab Results  Component Value Date   WBC 4.0 07/18/2020   HGB 10.7 (L) 07/18/2020   HCT 30.7 (L) 07/18/2020   MCV 96.2 07/18/2020   PLT 80 (L) 07/18/2020    Lab Results  Component Value Date   CREATININE 0.57 07/18/2020    No results found for: "PSA"  No results found for: "TESTOSTERONE"  Lab Results  Component Value Date   HGBA1C 5.6 05/20/2015    Urinalysis    Component Value Date/Time   COLORURINE AMBER (A) 07/16/2020 1120   APPEARANCEUR Clear 09/29/2020 0955   LABSPEC 1.025 07/16/2020 1120   LABSPEC 1.020 10/25/2011 1417   PHURINE 5.0 07/16/2020 1120   GLUCOSEU Negative 09/29/2020 0955   GLUCOSEU Negative 10/25/2011 1417   HGBUR SMALL (A) 07/16/2020 1120   BILIRUBINUR Negative 09/29/2020 0955   BILIRUBINUR Negative 10/25/2011 1417   KETONESUR NEGATIVE 07/16/2020 1120   PROTEINUR Negative 09/29/2020 0955   PROTEINUR NEGATIVE 07/16/2020 1120   NITRITE Negative 09/29/2020 0955   NITRITE POSITIVE (A) 07/16/2020 1120   LEUKOCYTESUR Negative 09/29/2020 0955   LEUKOCYTESUR NEGATIVE 07/16/2020 1120   LEUKOCYTESUR Negative 10/25/2011 1417    Pertinent Imaging:   Assessment & Plan: Macrodantin 90x3 sent to pharmacy and I will see in a year  1. Urinary frequency    No follow-ups on file.  Reece Packer, MD  [facility] [address], [place], [place] [address] [phone]

## 2021-11-06 ENCOUNTER — Telehealth: Payer: Self-pay

## 2021-11-06 ENCOUNTER — Other Ambulatory Visit: Payer: Self-pay | Admitting: Neurosurgery

## 2021-11-06 MED ORDER — OXYCODONE HCL 5 MG PO TABS: 5.0000 mg | ORAL_TABLET | Freq: Four times a day (QID) | ORAL | 0 refills | Status: AC | PRN

## 2021-11-06 NOTE — Telephone Encounter (Signed)
-----   Message from Peggyann Shoals sent at 11/06/2021  9:08 AM EDT ----- Regarding: pain med refill Contact: [phone] C3-4 corpectomy, C2-5 anterioe plate, C2-6 posterioe fusion, en bloc resection of tumor on 03/27/21 Oxycodone '5mg'$  1 every morning [facility]

## 2021-11-06 NOTE — Telephone Encounter (Signed)
Patient has been notified about the message below and verbalized understanding. She stated that she has a appt with her PCP at the end of this month and will discuss any further refills with her.

## 2021-11-17 ENCOUNTER — Other Ambulatory Visit: Payer: Self-pay | Admitting: Internal Medicine

## 2021-11-17 DIAGNOSIS — Z1231 Encounter for screening mammogram for malignant neoplasm of breast: Secondary | ICD-10-CM

## 2021-11-30 ENCOUNTER — Ambulatory Visit
Admission: RE | Admit: 2021-11-30 | Discharge: 2021-11-30 | Disposition: A | Discharge status: Home / Self Care | Payer: Medicare Other | Source: Ambulatory Visit | Attending: Internal Medicine | Admitting: Internal Medicine

## 2021-11-30 DIAGNOSIS — Z1231 Encounter for screening mammogram for malignant neoplasm of breast: Secondary | ICD-10-CM

## 2022-01-06 ENCOUNTER — Other Ambulatory Visit: Payer: Self-pay

## 2022-01-06 DIAGNOSIS — Z981 Arthrodesis status: Secondary | ICD-10-CM

## 2022-01-07 ENCOUNTER — Ambulatory Visit
Admission: RE | Admit: 2022-01-07 | Discharge: 2022-01-07 | Disposition: A | Discharge status: Home / Self Care | Payer: Medicare Other | Source: Ambulatory Visit | Attending: Neurosurgery | Admitting: Neurosurgery

## 2022-01-07 ENCOUNTER — Ambulatory Visit (INDEPENDENT_AMBULATORY_CARE_PROVIDER_SITE_OTHER): Payer: Medicare Other | Admitting: Neurosurgery

## 2022-01-07 ENCOUNTER — Encounter: Payer: Self-pay | Admitting: Neurosurgery

## 2022-01-07 ENCOUNTER — Ambulatory Visit
Admission: RE | Admit: 2022-01-07 | Discharge: 2022-01-07 | Disposition: A | Discharge status: Home / Self Care | Payer: Medicare Other | Attending: Neurosurgery | Admitting: Neurosurgery

## 2022-01-07 VITALS — BP 128/68 | Ht 62.0 in | Wt 107.6 lb

## 2022-01-07 DIAGNOSIS — Z981 Arthrodesis status: Secondary | ICD-10-CM | POA: Insufficient documentation

## 2022-01-07 DIAGNOSIS — Z09 Encounter for follow-up examination after completed treatment for conditions other than malignant neoplasm: Secondary | ICD-10-CM | POA: Diagnosis not present

## 2022-01-07 DIAGNOSIS — R221 Localized swelling, mass and lump, neck: Secondary | ICD-10-CM

## 2022-01-07 NOTE — Progress Notes (Signed)
   DOS: 03/27/21 C3-4 corpectomy, C2-6 posterior fusion, en bloc resection of tumor Pathology: Calcium pyrophosphate deposition   HISTORY OF PRESENT ILLNESS: 01/07/2022 [name] is status post extensive cervical reconstruction and resection of posterior cervical soft tissue mass.  She is improved since I last saw her.  Her swallowing has continued to cause some issues, but is better than it was.  Her voice is also strengthen.  She is dealing with significant issues with her psoriasis.  She is change medications 5 times..   PHYSICAL EXAMINATION:   Vitals:   01/07/22 1407  BP: 128/68   General: Patient is well developed, well nourished, calm, collected, and in no apparent distress.  NEUROLOGICAL:  General: In no acute distress.  Awake, alert, oriented to person, place, and time. Pupils equal round and reactive to light.   Strength: Side Biceps Triceps Deltoid Interossei Grip Wrist Ext. Wrist Flex.  R '5 5 5 5 '$ 4- 5 5  L '5 5 5 5 5 5 5   '$ Incision c/d/i   ROS (Neurologic): Negative except as noted above  IMAGING: No complications noted on x-rays today.  She appears to have fused.  ASSESSMENT/PLAN:  Brandy Haley is doing well after complex cervical reconstruction.  She appears to have fused.  She will continue to modify her medication regimen for her psoriasis which I think is one of her primary issues currently.  I will see her back on an as-needed basis.  I spent a total of 10 minutes in this patient's care today. This time was spent reviewing pertinent records including imaging studies, obtaining and confirming history, performing a directed evaluation, formulating and discussing my recommendations, and documenting the visit within the medical record.    Brandy Maw MD, [facility] Department of Neurosurgery

## 2022-03-25 NOTE — H&P (Signed)
Pre-Procedure H&P   Patient ID: Brandy Haley is a 67 y.o. female.  Gastroenterology Provider: [name], DO  Referring Provider: Laurine Blazer, PA PCP: Gladstone Lighter, MD  Date: 03/26/2022  HPI [name] is a 67 y.o. female who presents today for Esophagogastroduodenoscopy and Colonoscopy for Dysphagia, surveillance personal history of colon polyps .  Patient had a history of dysphagia which improved on its own by 2017, however, she underwent neck surgery and has noted some issues with solids and pills since then. Reports this is "far worse" than previous.  If solids stick she feels she will have to regurgitate liquid through her naris.  She denies any reflux symptoms nausea vomiting weight loss appetite changes or odynophagia.  No dysphagia to liquids.  Bowel movements occur 2-3 times a day.  She is status post appendectomy and cholecystectomy.  She is on [place] for psoriatic arthritis and psoriasis. Reports she has had lost 20+ lbs since starting otezla  Patient with a personal history of colon polyps.  She last underwent colonoscopy in August 2017 with 2 tubular adenomas.  Biopsies negative for microscopic colitis.  Diverticulosis was also noted.  She also underwent an EGD at that time demonstrating reflux esophagitis grade B, gastritis and abnormal esophageal motility.  Biopsies were negative for H. pylori Barrett's esophagus and celiac disease Patient had previously undergone a colostomy after partial colectomy (sigmoid) and has been reanastomosed which was patent on 2017 colonoscopy.  Anastomosis was noted approximately 8 to 10 cm from the anus.  Had partial colectomy due to abscess (sigmoid). Has had loose stools since, but manageable. Negative for microscopic colitis the past.   Most recent lab work hemoglobin 13.3 MCV 98 platelets 276,000 creatinine 0.4 AST 82 ALT 71 alk phos 63 total bili 0.4  No family history of colon cancer or colon polyps   Past  Medical History:  Diagnosis Date   Diverticulitis    Ruptured abscess   GERD (gastroesophageal reflux disease)    GI bleed    Hypercholesteremia    Hypertension     Past Surgical History:  Procedure Laterality Date   APPENDECTOMY     CHOLECYSTECTOMY     COLONOSCOPY WITH PROPOFOL N/A 09/09/2015   Procedure: COLONOSCOPY WITH PROPOFOL;  Surgeon: Lollie Sails, MD;  Location: [facility] ENDOSCOPY;  Service: Endoscopy;  Laterality: N/A;   COLOSTOMY     COLOSTOMY REVERSAL     ESOPHAGOGASTRODUODENOSCOPY (EGD) WITH PROPOFOL N/A 09/09/2015   Procedure: ESOPHAGOGASTRODUODENOSCOPY (EGD) WITH PROPOFOL;  Surgeon: Lollie Sails, MD;  Location: [facility] ENDOSCOPY;  Service: Endoscopy;  Laterality: N/A;   Orthopedic surgeries     [address] [place] of her house and had multiple fractures repaired.     Family History No h/o GI disease or malignancy  Review of Systems  Constitutional:  Negative for activity change, appetite change, chills, diaphoresis, fatigue, fever and unexpected weight change.  HENT:  Positive for trouble swallowing. Negative for voice change.   Respiratory:  Negative for shortness of breath and wheezing.   Cardiovascular:  Negative for chest pain, palpitations and leg swelling.  Gastrointestinal:  Negative for abdominal distention, abdominal pain, anal bleeding, blood in stool, constipation, diarrhea, nausea, rectal pain and vomiting.  Musculoskeletal:  Negative for arthralgias and myalgias.  Skin:  Negative for color change and pallor.  Neurological:  Negative for dizziness, syncope and weakness.  Psychiatric/Behavioral:  Negative for confusion.   All other systems reviewed and are negative.    Medications No current  facility-administered medications on file prior to encounter.   Current Outpatient Medications on File Prior to Encounter  Medication Sig Dispense Refill   benazepril (LOTENSIN) 10 MG tablet Take 10 mg by mouth daily.     Calcium Carbonate-Vitamin D 600-400 MG-UNIT  tablet Take 1 tablet by mouth daily.     celecoxib (CELEBREX) 200 MG capsule Take 200 mg by mouth 2 (two) times daily.     cyanocobalamin 1000 MCG tablet Take by mouth.     ezetimibe (ZETIA) 10 MG tablet Take 10 mg by mouth daily.     Multiple Vitamin (MULTI-VITAMIN) tablet Take 1 tablet by mouth daily.     gabapentin (NEURONTIN) 300 MG capsule 1 po qHS (Patient not taking: Reported on 10/05/2021)     nitrofurantoin (MACRODANTIN) 100 MG capsule Take 1 capsule (100 mg total) by mouth daily. (Patient not taking: Reported on 03/26/2022) 30 capsule 11   [DISCONTINUED] ferrous sulfate 325 (65 FE) MG tablet Take 1 tablet (325 mg total) by mouth daily. 30 tablet 1    Pertinent medications related to GI and procedure were reviewed by me with the patient prior to the procedure   Current Facility-Administered Medications:    0.9 %  sodium chloride infusion, , Intravenous, Continuous, [name], DO      Allergies  Allergen Reactions   Cefdinir Nausea And Vomiting   Ciprofloxacin Hives   Infliximab     Other reaction(s): Other (See Comments) "Went into shock"   Pyridium [Phenazopyridine Hcl] Nausea And Vomiting   Allergies were reviewed by me prior to the procedure  Objective   Body mass index is 18.88 kg/m. Vitals:   03/26/22 1245  BP: (!) 155/86  Pulse: (!) 107  Resp: 20  Temp: (!) 96.8 F (36 C)  TempSrc: Temporal  SpO2: 100%  Weight: 46.8 kg  Height: '5\' 2"'$  (1.575 m)     Physical Exam Vitals and nursing note reviewed.  Constitutional:      General: She is not in acute distress.    Appearance: Normal appearance. She is not ill-appearing, toxic-appearing or diaphoretic.  HENT:     Head: Normocephalic and atraumatic.     Nose: Nose normal.     Mouth/Throat:     Mouth: Mucous membranes are moist.     Pharynx: Oropharynx is clear.  Eyes:     General: No scleral icterus.    Extraocular Movements: Extraocular movements intact.  Cardiovascular:     Rate and  Rhythm: Regular rhythm. Tachycardia present.     Heart sounds: Normal heart sounds. No murmur heard.    No friction rub. No gallop.  Pulmonary:     Effort: Pulmonary effort is normal. No respiratory distress.     Breath sounds: Normal breath sounds. No wheezing, rhonchi or rales.  Abdominal:     General: Abdomen is flat. Bowel sounds are normal. There is no distension.     Palpations: Abdomen is soft.     Tenderness: There is no abdominal tenderness. There is no guarding or rebound.  Musculoskeletal:     Cervical back: Neck supple.     Right lower leg: No edema.     Left lower leg: No edema.  Skin:    General: Skin is warm and dry.     Coloration: Skin is not jaundiced or pale.  Neurological:     General: No focal deficit present.     Mental Status: She is alert and oriented to person, place, and time. Mental status is  at baseline.  Psychiatric:        Mood and Affect: Mood normal.        Behavior: Behavior normal.        Thought Content: Thought content normal.        Judgment: Judgment normal.      Assessment:  [name] is a 67 y.o. female  who presents today for Esophagogastroduodenoscopy and Colonoscopy for Dysphagia, surveillance personal history of colon polyps .  Plan:  Esophagogastroduodenoscopy and Colonoscopy with possible intervention today  Esophagogastroduodenoscopy and Colonoscopy with possible biopsy, control of bleeding, polypectomy, and interventions as necessary has been discussed with the patient/patient representative. Informed consent was obtained from the patient/patient representative after explaining the indication, nature, and risks of the procedure including but not limited to death, bleeding, perforation, missed neoplasm/lesions, cardiorespiratory compromise, and reaction to medications. Opportunity for questions was given and appropriate answers were provided. Patient/patient representative has verbalized understanding is amenable to undergoing  the procedure.   [name], DO  [facility] Gastroenterology  Portions of the record may have been created with voice recognition software. Occasional wrong-word or 'sound-a-like' substitutions may have occurred due to the inherent limitations of voice recognition software.  Read the chart carefully and recognize, using context, where substitutions may have occurred.

## 2022-03-26 ENCOUNTER — Ambulatory Visit: Payer: Medicare Other | Admitting: Certified Registered Nurse Anesthetist

## 2022-03-26 ENCOUNTER — Encounter: Payer: Self-pay | Admitting: Gastroenterology

## 2022-03-26 ENCOUNTER — Encounter
Admission: RE | Disposition: A | Discharge status: Home / Self Care | Payer: Self-pay | Source: Home / Self Care | Attending: Gastroenterology

## 2022-03-26 ENCOUNTER — Ambulatory Visit
Admission: RE | Admit: 2022-03-26 | Discharge: 2022-03-26 | Disposition: A | Discharge status: Home / Self Care | Payer: Medicare Other | Attending: Gastroenterology | Admitting: Gastroenterology

## 2022-03-26 DIAGNOSIS — Z9049 Acquired absence of other specified parts of digestive tract: Secondary | ICD-10-CM | POA: Insufficient documentation

## 2022-03-26 DIAGNOSIS — K529 Noninfective gastroenteritis and colitis, unspecified: Secondary | ICD-10-CM | POA: Diagnosis not present

## 2022-03-26 DIAGNOSIS — Z86010 Personal history of colonic polyps: Secondary | ICD-10-CM | POA: Insufficient documentation

## 2022-03-26 DIAGNOSIS — K640 First degree hemorrhoids: Secondary | ICD-10-CM | POA: Insufficient documentation

## 2022-03-26 DIAGNOSIS — I10 Essential (primary) hypertension: Secondary | ICD-10-CM | POA: Insufficient documentation

## 2022-03-26 DIAGNOSIS — Z1211 Encounter for screening for malignant neoplasm of colon: Secondary | ICD-10-CM | POA: Insufficient documentation

## 2022-03-26 DIAGNOSIS — Z980 Intestinal bypass and anastomosis status: Secondary | ICD-10-CM | POA: Diagnosis not present

## 2022-03-26 DIAGNOSIS — L4050 Arthropathic psoriasis, unspecified: Secondary | ICD-10-CM | POA: Diagnosis not present

## 2022-03-26 DIAGNOSIS — K449 Diaphragmatic hernia without obstruction or gangrene: Secondary | ICD-10-CM | POA: Diagnosis not present

## 2022-03-26 DIAGNOSIS — K200 Eosinophilic esophagitis: Secondary | ICD-10-CM | POA: Diagnosis not present

## 2022-03-26 DIAGNOSIS — R1310 Dysphagia, unspecified: Secondary | ICD-10-CM | POA: Insufficient documentation

## 2022-03-26 DIAGNOSIS — K224 Dyskinesia of esophagus: Secondary | ICD-10-CM | POA: Insufficient documentation

## 2022-03-26 DIAGNOSIS — K5730 Diverticulosis of large intestine without perforation or abscess without bleeding: Secondary | ICD-10-CM | POA: Insufficient documentation

## 2022-03-26 DIAGNOSIS — Z09 Encounter for follow-up examination after completed treatment for conditions other than malignant neoplasm: Secondary | ICD-10-CM | POA: Diagnosis not present

## 2022-03-26 HISTORY — PX: COLONOSCOPY: SHX5424

## 2022-03-26 HISTORY — PX: ESOPHAGOGASTRODUODENOSCOPY: SHX5428

## 2022-03-26 SURGERY — COLONOSCOPY
Anesthesia: General

## 2022-03-26 MED ORDER — LIDOCAINE HCL (CARDIAC) PF 100 MG/5ML IV SOSY
PREFILLED_SYRINGE | INTRAVENOUS | Status: DC | PRN
  Administered 2022-03-26: 50 mg via INTRAVENOUS

## 2022-03-26 MED ORDER — PROPOFOL 10 MG/ML IV BOLUS
INTRAVENOUS | Status: DC | PRN
  Administered 2022-03-26: 20 mg via INTRAVENOUS
  Administered 2022-03-26: 80 mg via INTRAVENOUS

## 2022-03-26 MED ORDER — SODIUM CHLORIDE 0.9 % IV SOLN
INTRAVENOUS | Status: DC
  Administered 2022-03-26: 20 mL/h via INTRAVENOUS

## 2022-03-26 MED ORDER — PROPOFOL 500 MG/50ML IV EMUL
INTRAVENOUS | Status: DC | PRN
  Administered 2022-03-26: 150 ug/kg/min via INTRAVENOUS

## 2022-03-26 MED ORDER — PHENYLEPHRINE HCL (PRESSORS) 10 MG/ML IV SOLN
INTRAVENOUS | Status: DC | PRN
  Administered 2022-03-26: 240 ug via INTRAVENOUS

## 2022-03-26 NOTE — Op Note (Signed)
[facility] Gastroenterology Patient Name: Brandy Haley Procedure Date: 03/26/2022 12:59 PM MRN: [id number] Account #: [id number] Date of Birth: [birth year] Admit Type: Outpatient Age: 67 Room: [facility] ENDO ROOM 3 Gender: Female Note Status: Finalized Instrument Name: Peds Colonoscope [id number] Procedure:             Colonoscopy Indications:           High risk colon cancer surveillance: Personal history                         of colonic polyps Providers:             [name] DO, DO Referring MD:          Gladstone Lighter, MD (Referring MD) Medicines:             Monitored Anesthesia Care Complications:         No immediate complications. Estimated blood loss:                         Minimal. Procedure:             Pre-Anesthesia Assessment:                        - Prior to the procedure, a History and Physical was                         performed, and patient medications and allergies were                         reviewed. The patient is competent. The risks and                         benefits of the procedure and the sedation options and                         risks were discussed with the patient. All questions                         were answered and informed consent was obtained.                         Patient identification and proposed procedure were                         verified by the physician, the nurse, the anesthetist                         and the technician in the endoscopy suite. Mental                         Status Examination: alert and oriented. Airway                         Examination: normal oropharyngeal airway and neck                         mobility. Respiratory Examination: clear to  auscultation. CV Examination: RRR, no murmurs, no S3                         or S4. Prophylactic Antibiotics: The patient does not                         require prophylactic antibiotics. Prior                          Anticoagulants: The patient has taken no anticoagulant                         or antiplatelet agents. ASA Grade Assessment: III - A                         patient with severe systemic disease. After reviewing                         the risks and benefits, the patient was deemed in                         satisfactory condition to undergo the procedure. The                         anesthesia plan was to use monitored anesthesia care                         (MAC). Immediately prior to administration of                         medications, the patient was re-assessed for adequacy                         to receive sedatives. The heart rate, respiratory                         rate, oxygen saturations, blood pressure, adequacy of                         pulmonary ventilation, and response to care were                         monitored throughout the procedure. The physical                         status of the patient was re-assessed after the                         procedure.                        After obtaining informed consent, the colonoscope was                         passed under direct vision. Throughout the procedure,                         the patient's blood pressure, pulse, and oxygen  saturations were monitored continuously. The                         Colonoscope was introduced through the anus and                         advanced to the the cecum, identified by appendiceal                         orifice and ileocecal valve. The colonoscopy was                         performed without difficulty. The patient tolerated                         the procedure well. The quality of the bowel                         preparation was evaluated using the BBPS [facility] Bowel                         Preparation Scale) with scores of: Right Colon = 1                         (portion of mucosa seen, but other areas not well seen                         due to  staining, residual stool and/or opaque liquid),                         Transverse Colon = 2 (minor amount of residual                         staining, small fragments of stool and/or opaque                         liquid, but mucosa seen well) and Left Colon = 3                         (entire mucosa seen well with no residual staining,                         small fragments of stool or opaque liquid). The total                         BBPS score equals 6. The quality of the bowel                         preparation was inadequate. The ileocecal valve,                         appendiceal orifice, and rectum were photographed. Findings:      The perianal and digital rectal examinations were normal. Pertinent       negatives include normal sphincter tone.      There was evidence of a prior side-to-side colo-colonic anastomosis at 9       cm proximal to the  anus. This was patent and was characterized by       healthy appearing mucosa. The anastomosis was traversed. Estimated blood       loss: none.      Scattered small-mouthed diverticula were found in the descending colon.       Estimated blood loss: none.      Non-bleeding internal hemorrhoids were found during retroflexion. The       hemorrhoids were Grade I (internal hemorrhoids that do not prolapse).       Estimated blood loss: none.      Localized mild inflammation characterized by granularity was found in       the rectum. Biopsies were taken with a cold forceps for histology.       Estimated blood loss was minimal.      Semi-solid, stuck to walls, difficult to clean stool was found in the       transverse colon, in the ascending colon and in the cecum, interfering       with visualization. Lavage of the area was performed using copious       amounts, resulting in incomplete clearance with continued poor       visualization. Despite large amounts of lavage, could not wash stool       stuck to walls of cecum and ascending colon.  Estimated blood loss: none.      The exam was otherwise without abnormality on direct and retroflexion       views. Impression:            - Preparation of the colon was inadequate.                        - Patent side-to-side colo-colonic anastomosis,                         characterized by healthy appearing mucosa.                        - Diverticulosis in the descending colon.                        - Non-bleeding internal hemorrhoids.                        - Localized mild inflammation was found in the rectum                         secondary to colitis. Biopsied.                        - Stool in the transverse colon, in the ascending                         colon and in the cecum.                        - The examination was otherwise normal on direct and                         retroflexion views. Recommendation:        - Patient has a contact number available for  emergencies. The signs and symptoms of potential                         delayed complications were discussed with the patient.                         Return to normal activities tomorrow. Written                         discharge instructions were provided to the patient.                        - Discharge patient to home.                        - Resume previous diet.                        - Continue present medications.                        - Await pathology results.                        - Repeat colonoscopy in 6 months because the bowel                         preparation was poor.                        - Return to GI office as previously scheduled.                        - The findings and recommendations were discussed with                         the patient. Procedure Code(s):     --- Professional ---                        [phone], Colonoscopy, flexible; with biopsy, single or                         multiple Diagnosis Code(s):     --- Professional ---                         Z86.010, Personal history of colonic polyps                        K64.0, First degree hemorrhoids                        Z98.0, Intestinal bypass and anastomosis status                        K52.9, Noninfective gastroenteritis and colitis,                         unspecified                        K57.30, Diverticulosis of large intestine without  perforation or abscess without bleeding CPT copyright 2022 American Medical Association. All rights reserved. The codes documented in this report are preliminary and upon coder review may  be revised to meet current compliance requirements. Attending Participation:      I personally performed the entire procedure. Volney American, DO [name] DO, DO 03/26/2022 2:01:02 PM This report has been signed electronically. Number of Addenda: 0 Note Initiated On: 03/26/2022 12:59 PM Scope Withdrawal Time: 0 hours 12 minutes 30 seconds  Total Procedure Duration: 0 hours 18 minutes 29 seconds  Estimated Blood Loss:  Estimated blood loss was minimal.      [facility]

## 2022-03-26 NOTE — Op Note (Signed)
[facility] Gastroenterology Patient Name: Brandy Haley Procedure Date: 03/26/2022 1:00 PM MRN: [id number] Account #: [id number] Date of Birth: 09-23-1955 Admit Type: Outpatient Age: 67 Room: [facility] ENDO ROOM 3 Gender: Female Note Status: Finalized Instrument Name: Michaelle Birks [id number] Procedure:             Upper GI endoscopy Indications:           Dysphagia Providers:             [name] DO, DO Referring MD:          Gladstone Lighter, MD (Referring MD) Medicines:             Monitored Anesthesia Care Complications:         No immediate complications. Estimated blood loss:                         Minimal. Procedure:             Pre-Anesthesia Assessment:                        - Prior to the procedure, a History and Physical was                         performed, and patient medications and allergies were                         reviewed. The patient is competent. The risks and                         benefits of the procedure and the sedation options and                         risks were discussed with the patient. All questions                         were answered and informed consent was obtained.                         Patient identification and proposed procedure were                         verified by the physician, the nurse, the anesthetist                         and the technician in the endoscopy suite. Mental                         Status Examination: alert and oriented. Airway                         Examination: normal oropharyngeal airway and neck                         mobility. Respiratory Examination: clear to                         auscultation. CV Examination: RRR, no murmurs, no S3  or S4. Prophylactic Antibiotics: The patient does not                         require prophylactic antibiotics. Prior                         Anticoagulants: The patient has taken no anticoagulant                          or antiplatelet agents. ASA Grade Assessment: III - A                         patient with severe systemic disease. After reviewing                         the risks and benefits, the patient was deemed in                         satisfactory condition to undergo the procedure. The                         anesthesia plan was to use monitored anesthesia care                         (MAC). Immediately prior to administration of                         medications, the patient was re-assessed for adequacy                         to receive sedatives. The heart rate, respiratory                         rate, oxygen saturations, blood pressure, adequacy of                         pulmonary ventilation, and response to care were                         monitored throughout the procedure. The physical                         status of the patient was re-assessed after the                         procedure.                        After obtaining informed consent, the endoscope was                         passed under direct vision. Throughout the procedure,                         the patient's blood pressure, pulse, and oxygen                         saturations were monitored continuously. The Endoscope  was introduced through the mouth, and advanced to the                         second part of duodenum. The upper GI endoscopy was                         accomplished without difficulty. The patient tolerated                         the procedure well. Findings:      The duodenal bulb, first portion of the duodenum and second portion of       the duodenum were normal. Estimated blood loss: none.      A small hiatal hernia was present. Estimated blood loss: none.      The exam of the stomach was otherwise normal.      The Z-line was regular. Estimated blood loss: none.      Esophagogastric landmarks were identified: the gastroesophageal junction       was found at 40 cm  from the incisors.      Normal mucosa was found in the entire esophagus. The scope was       withdrawn. Dilation was performed with a Maloney dilator with no       resistance at 48 Fr. The dilation site was examined following endoscope       reinsertion and showed mild mucosal disruption. Mild disruption at the       GEJ. Biopsies were obtained from the proximal and distal esophagus with       cold forceps for histology of suspected eosinophilic esophagitis.       Estimated blood loss was minimal.      Abnormal motility was noted in the esophagus. The cricopharyngeus was       normal. There is spasticity of the esophageal body. The distal       esophagus/lower esophageal sphincter is open. Tertiary peristaltic waves       are noted. Estimated blood loss: none.      The exam of the esophagus was otherwise normal. Impression:            - Normal duodenal bulb, first portion of the duodenum                         and second portion of the duodenum.                        - Small hiatal hernia.                        - Z-line regular.                        - Esophagogastric landmarks identified.                        - Normal mucosa was found in the entire esophagus.                         Dilated.                        - Abnormal esophageal motility, consistent with  presbyesophagus.                        - Biopsies were taken with a cold forceps for                         evaluation of eosinophilic esophagitis. Recommendation:        - Patient has a contact number available for                         emergencies. The signs and symptoms of potential                         delayed complications were discussed with the patient.                         Return to normal activities tomorrow. Written                         discharge instructions were provided to the patient.                        - Discharge patient to home.                        - Soft diet  today.                        - moisten and chew food well. chew slowly                        - Use Protonix (pantoprazole) 40 mg PO daily.                        - Await pathology results.                        - Repeat upper endoscopy PRN for retreatment.                        - Return to GI office as previously scheduled.                        - The findings and recommendations were discussed with                         the patient. Procedure Code(s):     --- Professional ---                        [phone], Esophagogastroduodenoscopy, flexible,                         transoral; with biopsy, single or multiple                        [id number], Dilation of esophagus, by unguided sound or                         bougie, single or multiple passes Diagnosis Code(s):     --- Professional ---  K44.9, Diaphragmatic hernia without obstruction or                         gangrene                        K22.4, Dyskinesia of esophagus                        R13.10, Dysphagia, unspecified CPT copyright 2022 American Medical Association. All rights reserved. The codes documented in this report are preliminary and upon coder review may  be revised to meet current compliance requirements. Attending Participation:      I personally performed the entire procedure. Volney American, DO [name] DO, DO 03/26/2022 1:33:02 PM This report has been signed electronically. Number of Addenda: 0 Note Initiated On: 03/26/2022 1:00 PM Estimated Blood Loss:  Estimated blood loss was minimal.      [facility]

## 2022-03-26 NOTE — Transfer of Care (Signed)
Immediate Anesthesia Transfer of Care Note  Patient: Brandy Haley  Procedure(s) Performed: COLONOSCOPY ESOPHAGOGASTRODUODENOSCOPY (EGD)  Patient Location: Endoscopy Unit  Anesthesia Type:General  Level of Consciousness: awake, alert , and oriented  Airway & Oxygen Therapy: Patient Spontanous Breathing  Post-op Assessment: Report given to RN and Post -op Vital signs reviewed and stable  Post vital signs: Reviewed and stable  Last Vitals:  Vitals Value Taken Time  BP 132/68 03/26/22 1400  Temp 36.8 C 03/26/22 1400  Pulse 92 03/26/22 1401  Resp 19 03/26/22 1401  SpO2 100 % 03/26/22 1401  Vitals shown include unvalidated device data.  Last Pain:  Vitals:   03/26/22 1400  TempSrc: Temporal  PainSc: 0-No pain         Complications: No notable events documented.

## 2022-03-26 NOTE — Interval H&P Note (Signed)
History and Physical Interval Note: Preprocedure H&P from 03/26/22  was reviewed and there was no interval change after seeing and examining the patient.  Written consent was obtained from the patient after discussion of risks, benefits, and alternatives. Patient has consented to proceed with Esophagogastroduodenoscopy and Colonoscopy with possible intervention   03/26/2022 1:06 PM  Brandy Haley  has presented today for surgery, with the diagnosis of Personal history of colonic polyps (Z86.010) Dysphagia, unspecified type (R13.10).  The various methods of treatment have been discussed with the patient and family. After consideration of risks, benefits and other options for treatment, the patient has consented to  Procedure(s): COLONOSCOPY (N/A) ESOPHAGOGASTRODUODENOSCOPY (EGD) (N/A) as a surgical intervention.  The patient's history has been reviewed, patient examined, no change in status, stable for surgery.  I have reviewed the patient's chart and labs.  Questions were answered to the patient's satisfaction.     [name]

## 2022-03-26 NOTE — Anesthesia Postprocedure Evaluation (Signed)
Anesthesia Post Note  Patient: Brandy Haley  Procedure(s) Performed: COLONOSCOPY ESOPHAGOGASTRODUODENOSCOPY (EGD)  Patient location during evaluation: Endoscopy Anesthesia Type: General Level of consciousness: awake and alert Pain management: pain level controlled Vital Signs Assessment: post-procedure vital signs reviewed and stable Respiratory status: spontaneous breathing, nonlabored ventilation, respiratory function stable and patient connected to nasal cannula oxygen Cardiovascular status: blood pressure returned to baseline and stable Postop Assessment: no apparent nausea or vomiting Anesthetic complications: no   No notable events documented.   Last Vitals:  Vitals:   03/26/22 1245 03/26/22 1400  BP: (!) 155/86 132/68  Pulse: (!) 107   Resp: 20   Temp: (!) 36 C 36.8 C  SpO2: 100%     Last Pain:  Vitals:   03/26/22 1410  TempSrc:   PainSc: 0-No pain                 Ilene Qua

## 2022-03-26 NOTE — Anesthesia Preprocedure Evaluation (Signed)
Anesthesia Evaluation  Patient identified by MRN, date of birth, ID band Patient awake    Reviewed: Allergy & Precautions, NPO status , Patient's Chart, lab work & pertinent test results  History of Anesthesia Complications Negative for: history of anesthetic complications  Airway Mallampati: III  TM Distance: >3 FB Neck ROM: Limited    Dental no notable dental hx. (+) Teeth Intact   Pulmonary neg pulmonary ROS, neg sleep apnea, neg COPD, Patient abstained from smoking.Not current smoker   breath sounds clear to auscultation- rhonchi (-) wheezing      Cardiovascular Exercise Tolerance: Good METShypertension, (-) CAD and (-) Past MI (-) dysrhythmias  Rhythm:Regular Rate:Normal - Systolic murmurs and - Diastolic murmurs    Neuro/Psych Significant cervical spine disease s/p fusions  Neuromuscular disease  negative psych ROS   GI/Hepatic Neg liver ROS,GERD  ,,  Endo/Other  negative endocrine ROSneg diabetes    Renal/GU negative Renal ROS     Musculoskeletal negative musculoskeletal ROS (+)    Abdominal  (+) - obese  Peds  Hematology  (+) Blood dyscrasia, anemia   Anesthesia Other Findings Past Medical History: No date: Diverticulitis     Comment:  Ruptured abscess No date: GERD (gastroesophageal reflux disease) No date: GI bleed No date: Hypercholesteremia No date: Hypertension  Reproductive/Obstetrics                              Anesthesia Physical Anesthesia Plan  ASA: 2  Anesthesia Plan: General   Post-op Pain Management: Minimal or no pain anticipated   Induction: Intravenous  PONV Risk Score and Plan: 3 and Propofol infusion, TIVA and Ondansetron  Airway Management Planned: Natural Airway  Additional Equipment: None  Intra-op Plan:   Post-operative Plan:   Informed Consent: I have reviewed the patients History and Physical, chart, labs and discussed the procedure  including the risks, benefits and alternatives for the proposed anesthesia with the patient or authorized representative who has indicated his/her understanding and acceptance.     Dental advisory given  Plan Discussed with: CRNA and Anesthesiologist  Anesthesia Plan Comments: (Discussed risks of anesthesia with patient, including possibility of difficulty with spontaneous ventilation under anesthesia necessitating airway intervention, PONV, and rare risks such as cardiac or respiratory or neurological events, and allergic reactions. Discussed the role of CRNA in patient's perioperative care. Patient understands.)         Anesthesia Quick Evaluation

## 2022-03-29 ENCOUNTER — Encounter: Payer: Self-pay | Admitting: Gastroenterology

## 2022-03-29 LAB — SURGICAL PATHOLOGY

## 2022-05-27 ENCOUNTER — Other Ambulatory Visit: Payer: Self-pay | Admitting: Endocrinology

## 2022-05-27 DIAGNOSIS — E782 Mixed hyperlipidemia: Secondary | ICD-10-CM

## 2022-06-07 ENCOUNTER — Other Ambulatory Visit: Payer: Self-pay | Admitting: Internal Medicine

## 2022-06-07 DIAGNOSIS — G959 Disease of spinal cord, unspecified: Secondary | ICD-10-CM

## 2022-06-08 ENCOUNTER — Ambulatory Visit
Admission: RE | Admit: 2022-06-08 | Discharge: 2022-06-08 | Disposition: A | Discharge status: Home / Self Care | Payer: Medicare Other | Source: Ambulatory Visit | Attending: Endocrinology | Admitting: Endocrinology

## 2022-06-08 ENCOUNTER — Ambulatory Visit
Admission: RE | Admit: 2022-06-08 | Discharge: 2022-06-08 | Disposition: A | Discharge status: Home / Self Care | Payer: Medicare Other | Source: Ambulatory Visit | Attending: Internal Medicine | Admitting: Internal Medicine

## 2022-06-08 DIAGNOSIS — E782 Mixed hyperlipidemia: Secondary | ICD-10-CM | POA: Insufficient documentation

## 2022-06-08 DIAGNOSIS — G959 Disease of spinal cord, unspecified: Secondary | ICD-10-CM

## 2022-07-08 ENCOUNTER — Other Ambulatory Visit: Payer: Self-pay | Admitting: Gastroenterology

## 2022-07-08 DIAGNOSIS — R7989 Other specified abnormal findings of blood chemistry: Secondary | ICD-10-CM

## 2022-07-15 ENCOUNTER — Ambulatory Visit
Admission: RE | Admit: 2022-07-15 | Discharge: 2022-07-15 | Disposition: A | Discharge status: Home / Self Care | Payer: Medicare Other | Source: Ambulatory Visit | Attending: Gastroenterology | Admitting: Gastroenterology

## 2022-07-15 DIAGNOSIS — R7989 Other specified abnormal findings of blood chemistry: Secondary | ICD-10-CM

## 2022-08-11 ENCOUNTER — Other Ambulatory Visit: Payer: Self-pay | Admitting: Rheumatology

## 2022-08-11 DIAGNOSIS — M3320 Polymyositis, organ involvement unspecified: Secondary | ICD-10-CM

## 2022-08-22 ENCOUNTER — Encounter: Payer: Self-pay | Admitting: Cardiovascular Disease

## 2022-08-22 NOTE — Progress Notes (Unsigned)
Cardiology Office Note:  .   Date:  08/23/2022  ID:  Brandy Haley, DOB 12/02/1955, MRN [id number] PCP: Enid Baas, MD  [facility] Providers Cardiologist:  Kristeen Miss, MD {   History of Present Illness: .   Brandy Haley is a 67 y.o. 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  Post op had significant problems   No covid vaccines   She chokes while eating  Daughter comfirms that she aspirates regulalry   ROS:    Studies Reviewed: Marland Kitchen        ECG :  EKG Interpretation Date/Time:  Monday August 23 2022 14:30:33 EDT Ventricular Rate:  90 PR Interval:  102 QRS Duration:  74 QT Interval:  350 QTC Calculation: 428 R Axis:   49  Text Interpretation: Sinus rhythm with short PR When compared with ECG of 26-Jan-2021 10:10, No significant change was found Confirmed by Kristeen Miss ([id number]) on 08/23/2022 5:26:05 PM     Risk Assessment/Calculations:             Physical Exam:   VS:  BP 122/78   Pulse 98   Ht 5\' 2"  (1.575 m)   Wt 97 lb 3.2 oz (44.1 kg)   SpO2 99%   BMI 17.78 kg/m    Wt Readings from Last 3 Encounters:  08/23/22 97 lb 3.2 oz (44.1 kg)  03/26/22 103 lb 3.2 oz (46.8 kg)  01/07/22 107 lb 9.6 oz (48.8 kg)    GEN: thin, chronically ill apearing female  no acute distress NECK: No JVD; No carotid bruits CARDIAC: RR , 2-3/6 holosystolic murmur  RESPIRATORY:  Clear  to auscultation without rales, wheezing or rhonchi  ABDOMEN: Soft, non-tender, non-distended EXTREMITIES: Reduced skin turgor.  ASSESSMENT AND PLAN: .    1.  Heart murmur: [name] presents for further evaluation to make sure that she is able to take IgG infusions as well as prednisone.  Apparently this presents a risk of congestive heart failure.  She does have a 3/6 systolic ejection murmur at the left sternal border.  It is difficult to say whether this is tricuspid regurgitation, mitral regurgitation or combination of both.  Either way regurgitant lesions such as this or able to tolerate excess volume fairly well.  We will get an echocardiogram but she can go ahead and get her IgG infusions without significant risk.  On clinical exam she appears to be dehydrated.  Her oral intake has been greatly diminished.  She is lost 35 pounds over the past 6 months.  She has known aspiration and chokes when she eats.  I think that a little bit of extra volume may actually be beneficial.  I will be in the office on the day of her infusion and I would be happy  to discuss with her doctors if needed.  I will see her again in 2 months.       Dispo: 2 months   Signed, Kristeen Miss, MD

## 2022-08-23 ENCOUNTER — Ambulatory Visit: Payer: Medicare Other | Admitting: Cardiovascular Disease

## 2022-08-23 ENCOUNTER — Encounter: Payer: Self-pay | Admitting: Cardiovascular Disease

## 2022-08-23 VITALS — BP 122/78 | HR 98 | Ht 62.0 in | Wt 97.2 lb

## 2022-08-23 DIAGNOSIS — I10 Essential (primary) hypertension: Secondary | ICD-10-CM | POA: Insufficient documentation

## 2022-08-23 DIAGNOSIS — M3320 Polymyositis, organ involvement unspecified: Secondary | ICD-10-CM | POA: Insufficient documentation

## 2022-08-23 NOTE — Patient Instructions (Signed)
Medication Instructions:  Your physician recommends that you continue on your current medications as directed. Please refer to the Current Medication list given to you today.  *If you need a refill on your cardiac medications before your next appointment, please call your pharmacy*  Lab Work: NONE If you have labs (blood work) drawn today and your tests are completely normal, you will receive your results only by: MyChart Message (if you have MyChart) OR A paper copy in the mail If you have any lab test that is abnormal or we need to change your treatment, we will call you to review the results.  Testing/Procedures: ECHO Your physician has requested that you have an echocardiogram. Echocardiography is a painless test that uses sound waves to create images of your heart. It provides your doctor with information about the size and shape of your heart and how well your heart's chambers and valves are working. This procedure takes approximately one hour. There are no restrictions for this procedure. Please do NOT wear cologne, perfume, aftershave, or lotions (deodorant is allowed). Please arrive 15 minutes prior to your appointment time.  Follow-Up: At [facility], you and your health needs are our priority.  As part of our continuing mission to provide you with exceptional heart care, we have created designated Provider Care Teams.  These Care Teams include your primary Cardiologist (physician) and Advanced Practice Providers (APPs -  Physician Assistants and Nurse Practitioners) who all work together to provide you with the care you need, when you need it.  Your next appointment:   2 month(s)  Provider:   Kristeen Miss, MD

## 2022-08-25 ENCOUNTER — Ambulatory Visit
Admission: RE | Admit: 2022-08-25 | Discharge: 2022-08-25 | Disposition: A | Discharge status: Home / Self Care | Payer: Medicare Other | Source: Ambulatory Visit | Attending: Rheumatology | Admitting: Rheumatology

## 2022-08-25 ENCOUNTER — Inpatient Hospital Stay: Admission: RE | Admit: 2022-08-25 | Payer: Medicare Other | Source: Ambulatory Visit

## 2022-08-25 DIAGNOSIS — M3320 Polymyositis, organ involvement unspecified: Secondary | ICD-10-CM

## 2022-09-09 ENCOUNTER — Ambulatory Visit (HOSPITAL_COMMUNITY): Payer: Medicare Other | Attending: Cardiology

## 2022-09-09 DIAGNOSIS — M3320 Polymyositis, organ involvement unspecified: Secondary | ICD-10-CM | POA: Insufficient documentation

## 2022-09-09 DIAGNOSIS — I10 Essential (primary) hypertension: Secondary | ICD-10-CM | POA: Diagnosis not present

## 2022-09-09 LAB — ECHOCARDIOGRAM COMPLETE
AR max vel: 2.42 cm2
AV Area VTI: 2.35 cm2
AV Area mean vel: 2.45 cm2
AV Mean grad: 11.5 mmHg
AV Peak grad: 21.1 mmHg
Ao pk vel: 2.3 m/s
Area-P 1/2: 2.49 cm2
S' Lateral: 1.75 cm

## 2022-09-28 ENCOUNTER — Other Ambulatory Visit: Payer: Self-pay | Admitting: General Surgery

## 2022-10-04 ENCOUNTER — Ambulatory Visit: Payer: Medicare Other | Admitting: Urology

## 2022-10-06 ENCOUNTER — Emergency Department: Payer: Medicare Other

## 2022-10-06 ENCOUNTER — Emergency Department
Admission: EM | Admit: 2022-10-06 | Discharge: 2022-10-06 | Disposition: A | Discharge status: Home / Self Care | Payer: Medicare Other | Attending: Emergency Medicine | Admitting: Emergency Medicine

## 2022-10-06 DIAGNOSIS — I10 Essential (primary) hypertension: Secondary | ICD-10-CM | POA: Insufficient documentation

## 2022-10-06 DIAGNOSIS — S32011A Stable burst fracture of first lumbar vertebra, initial encounter for closed fracture: Secondary | ICD-10-CM | POA: Diagnosis not present

## 2022-10-06 DIAGNOSIS — W541XXA Struck by dog, initial encounter: Secondary | ICD-10-CM | POA: Insufficient documentation

## 2022-10-06 DIAGNOSIS — S0990XA Unspecified injury of head, initial encounter: Secondary | ICD-10-CM | POA: Diagnosis not present

## 2022-10-06 DIAGNOSIS — S3992XA Unspecified injury of lower back, initial encounter: Secondary | ICD-10-CM | POA: Diagnosis present

## 2022-10-06 MED ORDER — OXYCODONE HCL 5 MG PO TABS
10.0000 mg | ORAL_TABLET | Freq: Once | ORAL | Status: AC
  Administered 2022-10-06: 10 mg via ORAL
  Filled 2022-10-06: qty 2

## 2022-10-06 MED ORDER — MORPHINE SULFATE (PF) 4 MG/ML IV SOLN
4.0000 mg | Freq: Once | INTRAVENOUS | Status: AC
  Administered 2022-10-06: 4 mg via INTRAMUSCULAR
  Filled 2022-10-06: qty 1

## 2022-10-06 MED ORDER — OXYCODONE HCL 5 MG PO TABS
10.0000 mg | ORAL_TABLET | Freq: Four times a day (QID) | ORAL | 0 refills | Status: AC | PRN
Start: 2022-10-06 — End: 2022-10-09

## 2022-10-06 MED ORDER — OXYCODONE HCL 5 MG PO TABS: 5.0000 mg | ORAL_TABLET | Freq: Once | ORAL | Status: DC

## 2022-10-06 NOTE — ED Notes (Signed)
Called Ortho Tech in ref: to brace currently at urgent care will call back as soon as he returns to office

## 2022-10-06 NOTE — ED Provider Notes (Signed)
[facility] Provider Note    Event Date/Time   First MD Initiated Contact with Patient 10/06/22 1035     (approximate)   History   Chief Complaint: Fall   HPI  Brandy Haley is a 67 y.o. female with a history of hypertension, polymyositis who comes ED complaining of low back pain after mechanical fall.  She was in her usual state of health when she got tripped by her dog as it ran past her legs and bumped her.  She fell flat on her back on the floor, hitting her back and the back of her head.  She also hit her head on the wall.  Denies loss of consciousness or neck pain.  Was unable to get up and bear weight afterward so she crawled to the phone to contact her husband.  She tried placing a lidocaine patch and taking an oxycodone at home without relief of the pain.  Denies paresthesias or motor weakness.  No vision changes.  No severe headache.  No blood thinner use     Physical Exam   Triage Vital Signs: ED Triage Vitals [10/06/22 0931]  Encounter Vitals Group     BP (!) 165/101     Systolic BP Percentile      Diastolic BP Percentile      Pulse Rate 98     Resp 16     Temp 98.2 F (36.8 C)     Temp Source Oral     SpO2 96 %     Weight      Height      Head Circumference      Peak Flow      Pain Score 10     Pain Loc      Pain Education      Exclude from Growth Chart     Most recent vital signs: Vitals:   10/06/22 1300 10/06/22 1400  BP: (!) 139/90 (!) 164/103  Pulse: 82 91  Resp:    Temp:    SpO2: 94% 96%    General: Awake, no distress.  CV:  Good peripheral perfusion.  Regular rate rhythm Resp:  Normal effort.  Clear to auscultation bilaterally Abd:  No distention.  Soft nontender Other:  Pelvis stable, hips nontender.  No pain with passive flexion bilateral hips or rotation of the legs.  No midline spinal tenderness.  No scalp hematoma or laceration.   ED Results / Procedures / Treatments   Labs (all labs ordered are listed,  but only abnormal results are displayed) Labs Reviewed - No data to display   EKG    RADIOLOGY CT head interpreted by me, negative for intracranial hemorrhage.  Radiology report reviewed.  CT lumbar spine reveals L1 burst fracture   PROCEDURES:  Procedures   MEDICATIONS ORDERED IN ED: Medications  morphine (PF) 4 MG/ML injection 4 mg (4 mg Intramuscular Given 10/06/22 1129)     IMPRESSION / MDM / ASSESSMENT AND PLAN / ED COURSE  I reviewed the triage vital signs and the nursing notes.  DDx: Intracranial hemorrhage, lumbar spine fracture, lumbar strain, contusion  Patient's presentation is most consistent with acute presentation with potential threat to life or bodily function.  Patient presents with mechanical fall causing blunt trauma to the back of the head and spine with low back pain.  Due to her age and a degree of frailty due to low weight and rheumatologic disease, will obtain CT scans.   ----------------------------------------- 2:56 PM on  10/06/2022 ----------------------------------------- Pain adequately controlled.  Injury discussed with neurosurgery who agrees with TLSO brace and outpatient follow-up.  Awaiting brace technician after which patient can be discharged.  She is somewhat opioid tolerant, so I sent a prescription to her pharmacy to increase oxycodone to manage her acute pain over the next few days.      FINAL CLINICAL IMPRESSION(S) / ED DIAGNOSES   Final diagnoses:  Closed stable burst fracture of first lumbar vertebra, initial encounter (HCC)     Rx / DC Orders   ED Discharge Orders          Ordered    oxyCODONE (ROXICODONE) 5 MG immediate release tablet  Every 6 hours PRN       Note to Pharmacy: Not opioid naive.   10/06/22 1409             Note:  This document was prepared using Dragon voice recognition software and may include unintentional dictation errors.   Sharman Cheek, MD 10/06/22 [phone]

## 2022-10-06 NOTE — ED Notes (Signed)
Received a call from ortho tech he will ordering and delivering brace

## 2022-10-06 NOTE — ED Triage Notes (Signed)
Pt arrives via [facility] from home for a fall caused by her dogs. Pt c/o lower back pain. Denies LOC. No blood thinners. Pt recently diagnosed with polymyositis. Pt reports taking an oxycodone approximately one hour PTA.

## 2022-10-06 NOTE — ED Notes (Signed)
Gave pt po fluids/food while waiting for brace. Family at bedside, call within reach

## 2022-10-14 ENCOUNTER — Other Ambulatory Visit: Payer: Self-pay | Admitting: Family Medicine

## 2022-10-14 ENCOUNTER — Inpatient Hospital Stay
Admission: RE | Admit: 2022-10-14 | Discharge: 2022-10-14 | Disposition: A | Discharge status: Home / Self Care | Payer: Self-pay | Source: Ambulatory Visit | Attending: Orthopedic Surgery | Admitting: Orthopedic Surgery

## 2022-10-14 DIAGNOSIS — Z049 Encounter for examination and observation for unspecified reason: Secondary | ICD-10-CM

## 2022-10-15 ENCOUNTER — Inpatient Hospital Stay
Admission: RE | Admit: 2022-10-15 | Discharge: 2022-10-15 | Disposition: A | Discharge status: Home / Self Care | Payer: Self-pay | Source: Ambulatory Visit | Attending: Orthopedic Surgery | Admitting: Orthopedic Surgery

## 2022-10-15 ENCOUNTER — Other Ambulatory Visit: Payer: Self-pay

## 2022-10-15 DIAGNOSIS — Z049 Encounter for examination and observation for unspecified reason: Secondary | ICD-10-CM

## 2022-10-19 ENCOUNTER — Ambulatory Visit: Payer: Medicare Other | Admitting: Orthopedic Surgery

## 2022-10-22 ENCOUNTER — Other Ambulatory Visit: Payer: Self-pay | Admitting: Orthopedic Surgery

## 2022-10-22 DIAGNOSIS — S32011A Stable burst fracture of first lumbar vertebra, initial encounter for closed fracture: Secondary | ICD-10-CM

## 2022-10-22 NOTE — Progress Notes (Addendum)
Referring Physician:  No referring provider defined for this encounter.  Primary Physician:  Brandy Baas, MD  History of Present Illness: 10/28/2022 [name] has a history of HTN, polymyositis, GERD, psoriatic arthritis, hyperlipidemia.   She has history of C3-4 corpectomy, C2-6 posterior fusion, en bloc resection of tumor (Pathology: Calcium pyrophosphate deposition) by Dr. Myer Haff on 03/27/21.    Seen in ED on 10/06/22 for L1 burst fracture s/p fall. This was discussed with neurosurgery and she was placed in a TLSO brace.   She is here for follow up.   She is being treated for polymyositis- she is having infusions (IVIG) done and think this is helping.   She has intermittent LBP with no leg pain. Pain is worse with activity. No numbness, tingling, or weakness in her legs.   She notes overall weakness due to polymyositis, especially in her arms.   She is on chronic prednisone. She was given oxycodone from ED.   Bowel/Bladder Dysfunction: none  Conservative measures:  Physical therapy: has not participated in for her back  Multimodal medical therapy including regular antiinflammatories: celebrex, prednsione, oxycodone  Injections: has not received epidural steroid injections  Past Surgery:  03/27/21: c3 and c4 corpectomies, possible resection of vertebral artery with c2-6 posterior fusion by Dr. Myer Haff   Review of Systems:  A 10 point review of systems is negative, except for the pertinent positives and negatives detailed in the HPI.  Past Medical History: Past Medical History:  Diagnosis Date   Diverticulitis    Ruptured abscess   GERD (gastroesophageal reflux disease)    GI bleed    Hypercholesteremia    Hypertension     Past Surgical History: Past Surgical History:  Procedure Laterality Date   APPENDECTOMY     CHOLECYSTECTOMY     COLONOSCOPY N/A 03/26/2022   Procedure: COLONOSCOPY;  Surgeon: Jaynie Collins, DO;  Location: [facility]  ENDOSCOPY;  Service: Gastroenterology;  Laterality: N/A;   COLONOSCOPY WITH PROPOFOL N/A 09/09/2015   Procedure: COLONOSCOPY WITH PROPOFOL;  Surgeon: Christena Deem, MD;  Location: [facility] ENDOSCOPY;  Service: Endoscopy;  Laterality: N/A;   COLOSTOMY     COLOSTOMY REVERSAL     ESOPHAGOGASTRODUODENOSCOPY N/A 03/26/2022   Procedure: ESOPHAGOGASTRODUODENOSCOPY (EGD);  Surgeon: Jaynie Collins, DO;  Location: [facility] ENDOSCOPY;  Service: Gastroenterology;  Laterality: N/A;   ESOPHAGOGASTRODUODENOSCOPY (EGD) WITH PROPOFOL N/A 09/09/2015   Procedure: ESOPHAGOGASTRODUODENOSCOPY (EGD) WITH PROPOFOL;  Surgeon: Christena Deem, MD;  Location: [facility] ENDOSCOPY;  Service: Endoscopy;  Laterality: N/A;   Orthopedic surgeries     Larey Seat off of her house and had multiple fractures repaired.     Allergies: Allergies as of 10/28/2022 - Review Complete 10/28/2022  Allergen Reaction Noted   Cefdinir Nausea And Vomiting 08/04/2020   Ciprofloxacin Hives 09/08/2015   Infliximab  02/26/2021   Pyridium [phenazopyridine hcl] Nausea And Vomiting 08/04/2020   Tizanidine Other (See Comments) 10/06/2022    Medications: Outpatient Encounter Medications as of 10/28/2022  Medication Sig   benazepril (LOTENSIN) 10 MG tablet Take 10 mg by mouth daily.   celecoxib (CELEBREX) 200 MG capsule Take 200 mg by mouth 2 (two) times daily.   cyanocobalamin 1000 MCG tablet Take by mouth.   levothyroxine (SYNTHROID) 50 MCG tablet Take 50 mcg by mouth daily before breakfast.   metoprolol succinate (TOPROL XL) 25 MG 24 hr tablet Take 1 tablet (25 mg total) by mouth daily.   Oxycodone HCl 10 MG TABS Take by mouth.   pantoprazole (PROTONIX) 40  MG tablet Take 1 tablet by mouth daily.   predniSONE (DELTASONE) 20 MG tablet Take 20 mg by mouth 2 (two) times daily with a meal.   [DISCONTINUED] ferrous sulfate 325 (65 FE) MG tablet Take 1 tablet (325 mg total) by mouth daily.   No facility-administered encounter medications on file as of  10/28/2022.    Social History: Social History   Tobacco Use   Smoking status: Never    Passive exposure: Never   Smokeless tobacco: Never  Substance Use Topics   Alcohol use: No   Drug use: No    Family Medical History: Family History  Problem Relation Age of Onset   Cancer Mother        Throat and neck  died age 36   CAD Mother 69       Stents   Stroke Father        Died age 14   Breast cancer Neg Hx     Physical Examination: Vitals:   10/28/22 1006  BP: 136/84    General: Patient is well developed, well nourished, calm, collected, and in no apparent distress. Attention to examination is appropriate.  Respiratory: Patient is breathing without any difficulty.   NEUROLOGICAL:     Awake, alert, oriented to person, place, and time.  Speech is clear and fluent. Fund of knowledge is appropriate.   Cranial Nerves: Pupils equal round and reactive to light.  Facial tone is symmetric.    No lumbar tenderness noted. No tenderness at TL junction.   No abnormal lesions on exposed skin.   Strength: Side Biceps Triceps Deltoid Interossei Grip Wrist Ext. Wrist Flex.  R 4- 4- 5 --- 4- 5 5  L 4- 4- 5 --- 4- 5 5   Side Iliopsoas Quads Hamstring PF DF EHL  R 5 5 5 5 5 5   L 5 5 5 5 5 5    Above weakness likely due to polymyositis. Fingers in both hands are held flexed, she cannot abduct them.   Reflexes are 1+ and symmetric at the biceps, brachioradialis, patella and achilles.   Hoffman's is absent.  Clonus is not present.   Bilateral upper and lower extremity sensation is intact to light touch.     Gait is normal.     Medical Decision Making  Imaging: Lumbar xrays dated 10/27/22:  L1 compression fracture that is grossly stable.   Radiology report for above xrays is not yet available.    CT of lumbar spine dated 10/06/22:  FINDINGS: Segmentation: Normal.   Alignment: Stable lumbar lordosis since 2022 with mild chronic levoconvex lumbar scoliosis. No  significant spondylolisthesis.   Vertebrae: Mild chronic T12 superior endplate compression is stable since 2022. Visible 12th ribs appear intact.   There is a mild burst fracture of the L1 vertebral body with comminution and compression of the superior endplate and mild retropulsion of a posterosuperior endplate fragment. See series 9, image 29. The L1 pedicles and posterior elements remain intact. Retropulsion is up to 5 mm, and narrows the AP spinal canal there by about 45%. Loss of height is 25%.   L2 through L5 vertebrae appears stable and intact, including mild chronic L3 anterior superior endplate irregularity. Grossly intact visible sacrum and SI joints.   Paraspinal and other soft tissues: Chronic cholecystectomy. Aortoiliac calcified atherosclerosis. Negative visible other noncontrast abdominal viscera. Faint L1 level prevertebral edema/contusion.   Disc levels: Mild for age superimposed lumbar spine degeneration. Left side L5-S1 facet arthropathy.   IMPRESSION: 1.  Positive for a mild L1 burst fracture with comminution and retropulsion of the superior endplate. Subsequent mild to moderate narrowing of the L1 level AP lumbar spinal canal (by 45%). And 25% loss of vertebral body height. The L1 pedicles and posterior elements remain intact and aligned.   2. No other acute traumatic injury identified in the Lumbar Spine. Mild chronic T12 compression fracture. Generally mild for age lumbar spine degeneration.   3.  Aortic Atherosclerosis (ICD10-I70.0).     Electronically Signed   By: Odessa Fleming M.D.   On: 10/06/2022 12:01  I have personally reviewed the images and agree with the above interpretation.  Above lumbar CT scan reviewed with Dr. Myer Haff prior to her visit.  Assessment and Plan: Brandy Haley is a pleasant 67 y.o. female who has L1 burst fracture s/p fall on 10/06/22.   She has intermittent LBP with no leg pain. Pain is worse with activity. No numbness,  tingling, or weakness in her legs.   She notes overall weakness due to polymyositis, especially in her arms.   Xrays from today show stable L1 burst fracture.   Treatment options discussed with patient and following plan made:   - Continue with TLSO brace. No bending, twisting, or lifting.  - Continue on prn oxycodone from her PCP.  - Follow up in 1 month with repeat xrays.  - Discussed time in brace would be 3 months total.  - Continue to follow with neurology and rheumatology for polymyositis.   Dr. Myer Haff reviewed her xrays from today after her visit and he agrees with above plan.   I spent a total of 25 minutes in face-to-face and non-face-to-face activities related to this patient's care today including review of outside records, review of imaging, review of symptoms, physical exam, discussion of differential diagnosis, discussion of treatment options, and documentation.   Thank you for involving me in the care of this patient.   Drake Leach PA-C Dept. of Neurosurgery

## 2022-10-24 NOTE — Progress Notes (Unsigned)
  Cardiology Office Note:  .   Date:  10/25/2022  ID:  Brandy Haley, DOB [birth year], MRN [id number] PCP: Brandy Baas, MD  [facility] Providers Cardiologist:  Kristeen Miss, MD {   History of Present Illness: .   Brandy Haley is a 67 y.o. 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 7, 2024 Brandy Haley is seen for follow up of her murmur ( prior to her IgG infusion)  Echo revealed normal LV function Mild MR, trivial TR  Mild aortic sclerosis with minimal AS ( mean gradient  of 11.5 mmHg )   Her IgG and prednisone seems to be helping  Her neck muscles seem to be stronger    ROS:    Studies Reviewed: Marland Kitchen        ECG :        Risk Assessment/Calculations:           Physical Exam:     Physical Exam: Blood pressure (!) 140/80, pulse (!) 106, height 5\' 2"  (1.575 m), weight 106 lb (48.1 kg), SpO2 96%.  HYPERTENSION CONTROL Vitals:   10/25/22 1141 10/25/22 1208  BP: (!) 142/80 (!) 140/80    The patient's blood pressure is elevated above target today.  In order to address the patient's elevated BP: A new medication was prescribed today.       GEN:  Well nourished, well developed in no acute distress HEENT: Normal NECK: No JVD; No carotid  bruits LYMPHATICS: No lymphadenopathy CARDIAC: RRR , tachycardic,   3/6 systolic murmur  RESPIRATORY:  Clear to auscultation without rales, wheezing or rhonchi  ABDOMEN: Soft, non-tender, non-distended MUSCULOSKELETAL:  No edema; No deformity  SKIN: Warm and dry NEUROLOGIC:  Alert and oriented x 3   ASSESSMENT AND PLAN: .    1.  Heart murmur: Primarily due to her aortic sclerosis.  She also has mild mitral regurgitation and trivial tricuspid regurgitation.   2.  Tachycardia :  will add  toprol XL 25 mg a day   3.  Myositis:   seems better.          Dispo: 3 months with Brandy Bridegroom, NP   Signed, Kristeen Miss, MD

## 2022-10-25 ENCOUNTER — Ambulatory Visit: Payer: Medicare Other | Attending: Cardiovascular Disease | Admitting: Cardiovascular Disease

## 2022-10-25 ENCOUNTER — Encounter: Payer: Self-pay | Admitting: Cardiovascular Disease

## 2022-10-25 VITALS — BP 140/80 | HR 106 | Ht 62.0 in | Wt 106.0 lb

## 2022-10-25 DIAGNOSIS — I10 Essential (primary) hypertension: Secondary | ICD-10-CM | POA: Insufficient documentation

## 2022-10-25 DIAGNOSIS — R000 Tachycardia, unspecified: Secondary | ICD-10-CM | POA: Insufficient documentation

## 2022-10-25 MED ORDER — METOPROLOL SUCCINATE ER 25 MG PO TB24: 25.0000 mg | ORAL_TABLET | Freq: Every day | ORAL | 3 refills | Status: DC

## 2022-10-25 NOTE — Patient Instructions (Addendum)
Medication Instructions:  Your physician has recommended you make the following change in your medication: Start Metoprolol Succinate 25 mg by mouth daily   *If you need a refill on your cardiac medications before your next appointment, please call your pharmacy*   Lab Work: none If you have labs (blood work) drawn today and your tests are completely normal, you will receive your results only by: MyChart Message (if you have MyChart) OR A paper copy in the mail If you have any lab test that is abnormal or we need to change your treatment, we will call you to review the results.   Testing/Procedures: none   Follow-Up: At [facility], you and your health needs are our priority.  As part of our continuing mission to provide you with exceptional heart care, we have created designated Provider Care Teams.  These Care Teams include your primary Cardiologist (physician) and Advanced Practice Providers (APPs -  Physician Assistants and Nurse Practitioners) who all work together to provide you with the care you need, when you need it.  We recommend signing up for the patient portal called "MyChart".  Sign up information is provided on this After Visit Summary.  MyChart is used to connect with patients for Virtual Visits (Telemedicine).  Patients are able to view lab/test results, encounter notes, upcoming appointments, etc.  Non-urgent messages can be sent to your provider as well.   To learn more about what you can do with MyChart, go to [email or web address].    Your next appointment:   02/03/23 at 11:20  Provider:   Eligha Bridegroom, NP         Other Instructions

## 2022-10-27 ENCOUNTER — Ambulatory Visit
Admission: RE | Admit: 2022-10-27 | Discharge: 2022-10-27 | Disposition: A | Discharge status: Home / Self Care | Payer: Medicare Other | Attending: Orthopedic Surgery | Admitting: Orthopedic Surgery

## 2022-10-27 ENCOUNTER — Ambulatory Visit
Admission: RE | Admit: 2022-10-27 | Discharge: 2022-10-27 | Disposition: A | Discharge status: Home / Self Care | Payer: Medicare Other | Source: Ambulatory Visit | Attending: Orthopedic Surgery | Admitting: Orthopedic Surgery

## 2022-10-27 DIAGNOSIS — S32011A Stable burst fracture of first lumbar vertebra, initial encounter for closed fracture: Secondary | ICD-10-CM

## 2022-10-28 ENCOUNTER — Encounter: Payer: Self-pay | Admitting: Orthopedic Surgery

## 2022-10-28 ENCOUNTER — Ambulatory Visit: Payer: Medicare Other | Admitting: Orthopedic Surgery

## 2022-10-28 VITALS — BP 136/84 | Ht 62.0 in | Wt 106.0 lb

## 2022-10-28 DIAGNOSIS — W19XXXA Unspecified fall, initial encounter: Secondary | ICD-10-CM | POA: Diagnosis not present

## 2022-10-28 DIAGNOSIS — S32011A Stable burst fracture of first lumbar vertebra, initial encounter for closed fracture: Secondary | ICD-10-CM | POA: Diagnosis not present

## 2022-10-28 NOTE — Patient Instructions (Signed)
It was so nice to see you today. Thank you so much for coming in.    You have a broken bone at L1. This should heal without surgery.   Continue with your brace when up and walking.   No bending, twisting, or lifting.   Continue on oxycodone as needed from your PCP.   I will see you back in 1 month. You will need xrays prior to this visit. Please do not hesitate to call if you have any questions or concerns. You can also message me in MyChart.   Drake Leach PA-C [phone]    The physicians and staff at [facility] at [facility] are committed to providing excellent care. You may receive a survey requesting feedback about your experience at our office. We strive to receive "very good" responses to the survey questions. If you feel that your experience would prevent you from giving the office a "very good " response, please contact our office to try to remedy the situation. We may be reached at [phone]. Thank you for taking the time out of your busy day to complete the survey.

## 2022-12-03 ENCOUNTER — Ambulatory Visit
Admission: RE | Admit: 2022-12-03 | Discharge: 2022-12-03 | Disposition: A | Discharge status: Home / Self Care | Payer: Medicare Other | Source: Ambulatory Visit | Attending: Orthopedic Surgery | Admitting: Orthopedic Surgery

## 2022-12-03 ENCOUNTER — Ambulatory Visit
Admission: RE | Admit: 2022-12-03 | Discharge: 2022-12-03 | Disposition: A | Discharge status: Home / Self Care | Payer: Medicare Other | Attending: Orthopedic Surgery | Admitting: Orthopedic Surgery

## 2022-12-03 DIAGNOSIS — S32011A Stable burst fracture of first lumbar vertebra, initial encounter for closed fracture: Secondary | ICD-10-CM | POA: Diagnosis present

## 2022-12-03 NOTE — Progress Notes (Unsigned)
Referring Physician:  Enid Baas, MD [address] [place],  [place] [address]  Primary Physician:  Enid Baas, MD  History of Present Illness: [name] has a history of HTN, polymyositis, GERD, psoriatic arthritis, hyperlipidemia.   She has history of C3-4 corpectomy, C2-6 posterior fusion, en bloc resection of tumor (Pathology: Calcium pyrophosphate deposition) by Dr. Myer Haff on 03/27/21.    Last seen by me on 10/28/22 for L1 burst fracture s/p fall on 10/06/22. She was to continue with TLSO brace.  She is being treated for polymyositis- she is having infusions (IVIG) done and think this is helping.   She is here for follow up and repeat xrays.   Her back pain is better than it was, but she still has some intermittent pain. No leg pain. She feels like she gets tired easily. No numbness or tingling in her legs. She has weakness in her legs from polymyositis, but feels like this is getting better.   She notes overall weakness due to polymyositis, especially in her arms.   She is on chronic prednisone- she is down to 25mg  then 20mg . She is taking prn oxycodone- PCP is prescribing this.   Bowel/Bladder Dysfunction: none  Conservative measures:  Physical therapy: has not participated in for her back  Multimodal medical therapy including regular antiinflammatories: celebrex, prednsione, oxycodone  Injections: has not received epidural steroid injections  Past Surgery:  03/27/21: C3 and C4 corpectomies, possible resection of vertebral artery with C2-C6 posterior fusion by Dr. Myer Haff   Review of Systems:  A 10 point review of systems is negative, except for the pertinent positives and negatives detailed in the HPI.  Past Medical History: Past Medical History:  Diagnosis Date   Diverticulitis    Ruptured abscess   GERD (gastroesophageal reflux disease)    GI bleed    Hypercholesteremia    Hypertension     Past Surgical History: Past  Surgical History:  Procedure Laterality Date   APPENDECTOMY     CHOLECYSTECTOMY     COLONOSCOPY N/A 03/26/2022   Procedure: COLONOSCOPY;  Surgeon: Jaynie Collins, DO;  Location: [facility] ENDOSCOPY;  Service: Gastroenterology;  Laterality: N/A;   COLONOSCOPY WITH PROPOFOL N/A 09/09/2015   Procedure: COLONOSCOPY WITH PROPOFOL;  Surgeon: Christena Deem, MD;  Location: [facility] ENDOSCOPY;  Service: Endoscopy;  Laterality: N/A;   COLOSTOMY     COLOSTOMY REVERSAL     ESOPHAGOGASTRODUODENOSCOPY N/A 03/26/2022   Procedure: ESOPHAGOGASTRODUODENOSCOPY (EGD);  Surgeon: Jaynie Collins, DO;  Location: [facility] ENDOSCOPY;  Service: Gastroenterology;  Laterality: N/A;   ESOPHAGOGASTRODUODENOSCOPY (EGD) WITH PROPOFOL N/A 09/09/2015   Procedure: ESOPHAGOGASTRODUODENOSCOPY (EGD) WITH PROPOFOL;  Surgeon: Christena Deem, MD;  Location: [facility] ENDOSCOPY;  Service: Endoscopy;  Laterality: N/A;   Orthopedic surgeries     Larey Seat off of her house and had multiple fractures repaired.     Allergies: Allergies as of 12/06/2022 - Review Complete 10/28/2022  Allergen Reaction Noted   Cefdinir Nausea And Vomiting 08/04/2020   Ciprofloxacin Hives 09/08/2015   Infliximab  02/26/2021   Pyridium [phenazopyridine hcl] Nausea And Vomiting 08/04/2020   Tizanidine Other (See Comments) 10/06/2022    Medications: Outpatient Encounter Medications as of 12/06/2022  Medication Sig   benazepril (LOTENSIN) 10 MG tablet Take 10 mg by mouth daily.   celecoxib (CELEBREX) 200 MG capsule Take 200 mg by mouth 2 (two) times daily.   cyanocobalamin 1000 MCG tablet Take by mouth.   levothyroxine (SYNTHROID) 50 MCG tablet Take 50 mcg by mouth  daily before breakfast.   metoprolol succinate (TOPROL XL) 25 MG 24 hr tablet Take 1 tablet (25 mg total) by mouth daily.   Oxycodone HCl 10 MG TABS Take by mouth.   pantoprazole (PROTONIX) 40 MG tablet Take 1 tablet by mouth daily.   predniSONE (DELTASONE) 20 MG tablet Take 20 mg by mouth 2 (two)  times daily with a meal.   [DISCONTINUED] ferrous sulfate 325 (65 FE) MG tablet Take 1 tablet (325 mg total) by mouth daily.   No facility-administered encounter medications on file as of 12/06/2022.    Social History: Social History   Tobacco Use   Smoking status: Never    Passive exposure: Never   Smokeless tobacco: Never  Substance Use Topics   Alcohol use: No   Drug use: No    Family Medical History: Family History  Problem Relation Age of Onset   Cancer Mother        Throat and neck  died age 66   CAD Mother 69       Stents   Stroke Father        Died age 50   Breast cancer Neg Hx     Physical Examination: There were no vitals filed for this visit.    Awake, alert, oriented to person, place, and time.  Speech is clear and fluent. Fund of knowledge is appropriate.   Cranial Nerves: Pupils equal round and reactive to light.  Facial tone is symmetric.    No lumbar tenderness noted. No tenderness at TL junction.   No abnormal lesions on exposed skin.   Strength: Side Biceps Triceps Deltoid Interossei Grip Wrist Ext. Wrist Flex.  R 5 4- 5 --- 4- 5 5  L 5 4- 5 --- 4- 5 5   Side Iliopsoas Quads Hamstring PF DF EHL  R 5 5 5 5 5 5   L 5 5 5 5 5 5    Above weakness likely due to polymyositis. Fingers in both hands are held flexed, she cannot abduct them. She is partially able to extend fingers in left hand.   Reflexes are 1+ and symmetric at the biceps, brachioradialis, patella and achilles.   Hoffman's is absent.  Clonus is not present.   Bilateral upper and lower extremity sensation is intact to light touch.     Gait is normal.     Medical Decision Making  Imaging: Lumbar xrays dated 12/03/22:  L1 compression fracture that is grossly stable compared to previous xrays on 10/27/22.  Radiology report for above xrays is not yet available.   Assessment and Plan: Brandy Haley is a pleasant 67 y.o. female who has L1 burst fracture s/p fall on 10/06/22.   Her  back pain is better than it was, but she still has some intermittent pain. No leg pain. She feels like she gets tired easily. No numbness or tingling in her legs. She has weakness in her legs from polymyositis, but feels like this is getting better.   She notes overall weakness due to polymyositis, especially in her arms.   Xrays from today show stable L1 burst fracture.   Treatment options discussed with patient and following plan made:   - Continue with TLSO brace. No bending, twisting, or lifting.  - Continue on prn oxycodone from her PCP.  - Follow up in 1 month with repeat xrays (flexion/extension) - Continue to follow with neurology and rheumatology for polymyositis.   I spent a total of 15 minutes in face-to-face and non-face-to-face  activities related to this patient's care today including review of outside records, review of imaging, review of symptoms, physical exam, discussion of differential diagnosis, discussion of treatment options, and documentation.   Drake Leach PA-C Dept. of Neurosurgery

## 2022-12-06 ENCOUNTER — Ambulatory Visit (INDEPENDENT_AMBULATORY_CARE_PROVIDER_SITE_OTHER): Payer: Medicare Other | Admitting: Orthopedic Surgery

## 2022-12-06 ENCOUNTER — Encounter: Payer: Self-pay | Admitting: Orthopedic Surgery

## 2022-12-06 VITALS — BP 136/76 | Ht 62.0 in | Wt 106.0 lb

## 2022-12-06 DIAGNOSIS — W19XXXD Unspecified fall, subsequent encounter: Secondary | ICD-10-CM | POA: Diagnosis not present

## 2022-12-06 DIAGNOSIS — S32011D Stable burst fracture of first lumbar vertebra, subsequent encounter for fracture with routine healing: Secondary | ICD-10-CM | POA: Diagnosis not present

## 2022-12-06 DIAGNOSIS — S32011A Stable burst fracture of first lumbar vertebra, initial encounter for closed fracture: Secondary | ICD-10-CM

## 2022-12-06 NOTE — Patient Instructions (Signed)
It was so nice to see you today. Thank you so much for coming in.    You have a broken bone at L1. Xrays look good.   Continue with your brace when up and walking.   No bending, twisting, or lifting.   Continue on oxycodone as needed from your PCP.   I will see you back in 1 month. You will need xrays prior to this visit. Please do not hesitate to call if you have any questions or concerns. You can also message me in MyChart.   Drake Leach PA-C [phone]    The physicians and staff at [facility] at [facility] are committed to providing excellent care. You may receive a survey requesting feedback about your experience at our office. We strive to receive "very good" responses to the survey questions. If you feel that your experience would prevent you from giving the office a "very good " response, please contact our office to try to remedy the situation. We may be reached at [phone]. Thank you for taking the time out of your busy day to complete the survey.

## 2023-01-02 ENCOUNTER — Other Ambulatory Visit: Payer: Self-pay | Admitting: Orthopedic Surgery

## 2023-01-02 DIAGNOSIS — S32011D Stable burst fracture of first lumbar vertebra, subsequent encounter for fracture with routine healing: Secondary | ICD-10-CM

## 2023-01-02 NOTE — Progress Notes (Unsigned)
Referring Physician:  Enid Baas, MD [address] [place],  [place] [address]  Primary Physician:  Enid Baas, MD  History of Present Illness: [name] has a history of HTN, polymyositis, GERD, psoriatic arthritis, hyperlipidemia.   She has history of C3-4 corpectomy, C2-6 posterior fusion, en bloc resection of tumor (Pathology: Calcium pyrophosphate deposition) by Dr. Myer Haff on 03/27/21.    Last seen by me on 12/06/22 for L1 burst fracture s/p fall on 10/06/22. She was to continue with TLSO brace.  She is being treated for polymyositis- she is having infusions (IVIG) done and think this is helping.   She is here for follow up and repeat xrays.   She has seen continued improvement in her back pain. She has intermittent pain. No leg pain.   She has been weaning down on her prednisone for her polymyositis and notes some overall increased weakness.   She is taking prn oxycodone- PCP is prescribing this.   Bowel/Bladder Dysfunction: none  Conservative measures:  Physical therapy: has not participated in for her back  Multimodal medical therapy including regular antiinflammatories: celebrex, prednsione, oxycodone  Injections: has not received epidural steroid injections  Past Surgery:  03/27/21: C3 and C4 corpectomies, possible resection of vertebral artery with C2-C6 posterior fusion by Dr. Myer Haff   Review of Systems:  A 10 point review of systems is negative, except for the pertinent positives and negatives detailed in the HPI.  Past Medical History: Past Medical History:  Diagnosis Date   Diverticulitis    Ruptured abscess   GERD (gastroesophageal reflux disease)    GI bleed    Hypercholesteremia    Hypertension     Past Surgical History: Past Surgical History:  Procedure Laterality Date   APPENDECTOMY     CHOLECYSTECTOMY     COLONOSCOPY N/A 03/26/2022   Procedure: COLONOSCOPY;  Surgeon: Jaynie Collins, DO;  Location: [facility]  ENDOSCOPY;  Service: Gastroenterology;  Laterality: N/A;   COLONOSCOPY WITH PROPOFOL N/A 09/09/2015   Procedure: COLONOSCOPY WITH PROPOFOL;  Surgeon: Christena Deem, MD;  Location: [facility] ENDOSCOPY;  Service: Endoscopy;  Laterality: N/A;   COLOSTOMY     COLOSTOMY REVERSAL     ESOPHAGOGASTRODUODENOSCOPY N/A 03/26/2022   Procedure: ESOPHAGOGASTRODUODENOSCOPY (EGD);  Surgeon: Jaynie Collins, DO;  Location: [facility] ENDOSCOPY;  Service: Gastroenterology;  Laterality: N/A;   ESOPHAGOGASTRODUODENOSCOPY (EGD) WITH PROPOFOL N/A 09/09/2015   Procedure: ESOPHAGOGASTRODUODENOSCOPY (EGD) WITH PROPOFOL;  Surgeon: Christena Deem, MD;  Location: [facility] ENDOSCOPY;  Service: Endoscopy;  Laterality: N/A;   Orthopedic surgeries     Larey Seat off of her house and had multiple fractures repaired.     Allergies: Allergies as of 01/04/2023 - Review Complete 01/04/2023  Allergen Reaction Noted   Cefdinir Nausea And Vomiting 08/04/2020   Ciprofloxacin Hives 09/08/2015   Infliximab  02/26/2021   Pyridium [phenazopyridine hcl] Nausea And Vomiting 08/04/2020   Tizanidine Other (See Comments) 10/06/2022    Medications: Outpatient Encounter Medications as of 01/04/2023  Medication Sig   benazepril (LOTENSIN) 10 MG tablet Take 10 mg by mouth daily.   celecoxib (CELEBREX) 200 MG capsule Take 200 mg by mouth 2 (two) times daily.   cyanocobalamin 1000 MCG tablet Take by mouth.   levothyroxine (SYNTHROID) 50 MCG tablet Take 50 mcg by mouth daily before breakfast.   metoprolol succinate (TOPROL XL) 25 MG 24 hr tablet Take 1 tablet (25 mg total) by mouth daily.   Oxycodone HCl 10 MG TABS Take by mouth.   pantoprazole (PROTONIX)  40 MG tablet Take 1 tablet by mouth daily.   predniSONE (DELTASONE) 20 MG tablet Take 20 mg by mouth 2 (two) times daily with a meal.   [DISCONTINUED] ferrous sulfate 325 (65 FE) MG tablet Take 1 tablet (325 mg total) by mouth daily.   No facility-administered encounter medications on file as of  01/04/2023.    Social History: Social History   Tobacco Use   Smoking status: Never    Passive exposure: Never   Smokeless tobacco: Never  Substance Use Topics   Alcohol use: No   Drug use: No    Family Medical History: Family History  Problem Relation Age of Onset   Cancer Mother        Throat and neck  died age 61   CAD Mother 41       Stents   Stroke Father        Died age 59   Breast cancer Neg Hx     Physical Examination: Vitals:   01/04/23 1100  BP: 118/78      Awake, alert, oriented to person, place, and time.  Speech is clear and fluent. Fund of knowledge is appropriate.   Cranial Nerves: Pupils equal round and reactive to light.  Facial tone is symmetric.    No lumbar tenderness noted. No tenderness at TL junction.   No abnormal lesions on exposed skin.   Strength: Side Biceps Triceps Deltoid Interossei Grip Wrist Ext. Wrist Flex.  R 5 4- 5 --- 4- 5 5  L 5 4- 5 --- 4- 5 5   Side Iliopsoas Quads Hamstring PF DF EHL  R 5 5 5 5 5 5   L 5 5 5 5 5 5    Above weakness likely due to polymyositis. Fingers in both hands are held flexed, she cannot abduct them. She is partially able to extend fingers in left hand.   Reflexes are 1+ and symmetric at the biceps, brachioradialis, patella and achilles.   Hoffman's is absent.  Clonus is not present.   Bilateral upper and lower extremity sensation is intact to light touch.     Gait is normal.     Medical Decision Making  Imaging: Lumbar xrays dated 01/03/23:  L1 compression fracture that is grossly stable compared to previous xrays on 12/03/22. No gross instability on flex/ext views.   Radiology report for above xrays is not yet available.   Assessment and Plan: Brandy Haley is a pleasant 67 y.o. female who has L1 burst fracture s/p fall on 10/06/22.   Her back pain continues to improve, it is intermittent. No leg pain.  She has been weaning down on her prednisone for her polymyositis and notes some overall  increased weakness.   Xrays from today show stable L1 burst fracture.   Treatment options discussed with patient and following plan made:   - She can wean out of TLSO brace. May need to do slowly.  - Continue on prn oxycodone from her PCP.  - Continue to follow with neurology and rheumatology for polymyositis.  - Follow up with [place] prn for L1 burst fracture.   I spent a total of 15 minutes in face-to-face and non-face-to-face activities related to this patient's care today including review of outside records, review of imaging, review of symptoms, physical exam, discussion of differential diagnosis, discussion of treatment options, and documentation.   Drake Leach PA-C Dept. of Neurosurgery

## 2023-01-03 ENCOUNTER — Ambulatory Visit
Admission: RE | Admit: 2023-01-03 | Discharge: 2023-01-03 | Disposition: A | Discharge status: Home / Self Care | Payer: Medicare Other | Attending: Orthopedic Surgery | Admitting: Orthopedic Surgery

## 2023-01-03 ENCOUNTER — Ambulatory Visit
Admission: RE | Admit: 2023-01-03 | Discharge: 2023-01-03 | Disposition: A | Discharge status: Home / Self Care | Payer: Medicare Other | Source: Ambulatory Visit | Attending: Orthopedic Surgery | Admitting: Orthopedic Surgery

## 2023-01-03 DIAGNOSIS — S32011A Stable burst fracture of first lumbar vertebra, initial encounter for closed fracture: Secondary | ICD-10-CM

## 2023-01-04 ENCOUNTER — Ambulatory Visit (INDEPENDENT_AMBULATORY_CARE_PROVIDER_SITE_OTHER): Payer: Medicare Other | Admitting: Orthopedic Surgery

## 2023-01-04 ENCOUNTER — Encounter: Payer: Self-pay | Admitting: Orthopedic Surgery

## 2023-01-04 VITALS — BP 118/78 | Ht 62.0 in | Wt 106.0 lb

## 2023-01-04 DIAGNOSIS — W19XXXD Unspecified fall, subsequent encounter: Secondary | ICD-10-CM

## 2023-01-04 DIAGNOSIS — S32011D Stable burst fracture of first lumbar vertebra, subsequent encounter for fracture with routine healing: Secondary | ICD-10-CM | POA: Diagnosis not present

## 2023-01-31 NOTE — H&P (View-Only) (Signed)
Cardiology Office Note:  .   Date:  02/03/2023  ID:  Brandy Haley, DOB Oct 05, 1955, MRN [id number] PCP: Enid Baas, MD  [facility] Providers Cardiologist:  Kristeen Miss, MD    Patient Profile: .      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 Antoine Poche for hyperlipidemia. She underwent ETT 10/04/2017 which did not reveal any evidence of ischemia.  She had elevated BP response and was encouraged to closely follow BP at home.  Referred back to cardiology and seen by Dr. Elease Hashimoto 08/23/2022.  She was seen with her daughter Brandy Haley who is Charity fundraiser at [employer].  Reported a very rapid decline in her functional status with loss of muscle mass and strength, feeling profoundly weak.  Weight loss of 35 lbs over 6 months, and significantly diminished oral intake. Scheduled to start IgG infusions soon. Tested positive for RA and psoriasis and took autoimmune medications. Her physician wanted to ensure no risk for CHF. 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 Had lots of post op complications. Daughter reports she aspirates regularly; she chokes while eating. Heart murmur was noted on exam. Subsequently,  echocardiogram 09/09/22 revealed normal LV function, mild MR, trivial TR, and mild aortic sclerosis with minimal AAS (mean gradient of 11.5 mmHg.  At follow-up visit 10/25/2022 she was feeling stronger and felt that IgG and prednisone were helping. BP was elevated at 140/80 and HR was 106 bpm.  She was advised to start Toprol-XL 25 mg daily and return in 3 months for follow-up.        History of Present Illness: .   Brandy Haley is a very pleasant 68 y.o. female who is here today for follow-up of heart murmur and  hypertension.  She continues to have muscle fatigue, chest pain, and difficulty swallowing. She is on a liquid and soft diet. She reports chest pain has awakened her at times and is relieved by drinking fluids. She is currently receiving IVIG infusions every 28 days for polymyositis and is on metoprolol for heart rate control. Her coronary calcium score is high, indicating potential coronary artery disease. She has been unable to tolerate statins for elevated LDL cholesterol. Potassium is elevated and sodium is slightly low, possibly due to diet and fluid intake. She frequently drinks Propel and Boost.  Activity is limited by symptoms she believes are muscle fatigue secondary to polymyositis. She was recently browning meat for spaghetti and got very fatigued, diaphoretic and had to sit down to recover. She denies shortness of breath, orthopnea, PND, edema.   Discussed the use of AI scribe software for clinical note transcription with the patient, who gave verbal consent to proceed.   ROS: See HPI       Studies Reviewed: .         Risk Assessment/Calculations:             Physical Exam:   VS:  BP 120/76 (BP Location: Left Arm, Patient Position: Sitting, Cuff Size: Normal)   Pulse 97   Ht 5\' 1"  (1.549 m)   Wt 114 lb (51.7 kg)   SpO2 98%   BMI 21.54 kg/m    Wt Readings from Last 3 Encounters:  02/03/23 114 lb (51.7 kg)  01/04/23 106 lb (48.1 kg)  12/06/22 106 lb (48.1 kg)    GEN: Well nourished, well developed in no acute distress NECK: No JVD; No carotid bruits CARDIAC: RRR, 2/6 systolic murmur. No rubs, gallops RESPIRATORY:  Clear to auscultation without rales, wheezing or rhonchi  ABDOMEN: Soft, non-tender, non-distended EXTREMITIES:  No edema; No deformity     ASSESSMENT AND PLAN: .    Polymyositis: Prolonged illness and weakness with eventual diagnosis of polymyositis. She reports chronic muscle fatigue and weakness. Treatment with IVIG treatments every 28 days and recently  started on Methotrexate. Labs indicate elevated liver enzymes. She also has poor neck control. She is feeling better and has gained strength since starting treatment. She also has difficulty swallowing and is on a liquid diet. Management per [facility].  CAD: Significantly elevated coronary calcium score of 1108 (98th percentile) on CT 05/2022.  She reports chest pain that sometimes awakens her that is improved with drinking fluids.  She reports occasional episodes of becoming diaphoretic and having to sit down due to profound weakness. It is difficult to discern if these symptoms are due to polymyositis alone or if she is having angina. She is up and down stairs at home with no chest pain or shortness of breath. No prior ischemia evaluation. Through shared decision making and due to additional risk factors of hyperlipidemia and hypertension, we will get coronary CTA for ischemia evaluation. Will have her take an additional lopressor 50 mg 2 hours prior to test. Pt had abnormal coronary CT 02/14/23 that revealed concern for significant stenosis by FFR in mid LCx. No significant stenosis in LAD or RCA. Plan including risks of procedure discussed with patient and she agrees to proceed with cardiac catheterization.   Valve disease: Echocardiogram 09/09/2022 reveals mild MR, mild aortic sclerosis with minimal aortic stenosis (mean gradient of 11.5 mmHg), and trivial TR with normal LVEF 60 to 65%.  Systolic murmur noted on exam.  Chest pain as noted above, no shortness of breath, palpitations, presyncope or syncope.  We will continue to monitor clinically for now.  Hypertension: BP is well controlled.   Hyperlipidemia LDL goal < 70: Lipid panel completed 05/27/2022 with total cholesterol 243, HDL 36, LDL 160, triglycerides 252. She has been intolerant of multiple statins. Unclear as to whether she has tried ezetimibe.  She has a significantly elevated coronary calcium score. Need to consider novel lipid lowering options in  the setting of polymyositis.  Will plan to discuss further at next office visit.  Sinus tachycardia: She was put on metoprolol for elevated heart rate at last office visit 10/25/2022 by Dr. Elease Hashimoto.  HR remains slightly elevated today.  She is tolerating metoprolol without any concerning side effects. We will not up-titrate for concern for worsening fatigue on higher dose.   Informed Consent   Shared Decision Making/Informed Consent The risks [stroke (1 in 1000), death (1 in 1000), kidney failure [usually temporary] (1 in 500), bleeding (1 in 200), allergic reaction [possibly serious] (1 in 200)], benefits (diagnostic support and management of coronary artery disease) and alternatives of a cardiac catheterization were discussed in detail with Ms. Knoch and she is willing to proceed.           Dispo: 2-3 months with me/Request to see Dr. Excell Seltzer in the future (he sees her husband)  Signed, Eligha Bridegroom, NP-C

## 2023-01-31 NOTE — Progress Notes (Addendum)
Cardiology Office Note:  .   Date:  02/03/2023  ID:  Brandy Haley, DOB Oct 05, 1955, MRN [id number] PCP: Enid Baas, MD  [facility] Providers Cardiologist:  Kristeen Miss, MD    Patient Profile: .      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 Antoine Poche for hyperlipidemia. She underwent ETT 10/04/2017 which did not reveal any evidence of ischemia.  She had elevated BP response and was encouraged to closely follow BP at home.  Referred back to cardiology and seen by Dr. Elease Hashimoto 08/23/2022.  She was seen with her daughter [name] who is Charity fundraiser at [employer].  Reported a very rapid decline in her functional status with loss of muscle mass and strength, feeling profoundly weak.  Weight loss of 35 lbs over 6 months, and significantly diminished oral intake. Scheduled to start IgG infusions soon. Tested positive for RA and psoriasis and took autoimmune medications. Her physician wanted to ensure no risk for CHF. 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 Had lots of post op complications. Daughter reports she aspirates regularly; she chokes while eating. Heart murmur was noted on exam. Subsequently,  echocardiogram 09/09/22 revealed normal LV function, mild MR, trivial TR, and mild aortic sclerosis with minimal AAS (mean gradient of 11.5 mmHg.  At follow-up visit 10/25/2022 she was feeling stronger and felt that IgG and prednisone were helping. BP was elevated at 140/80 and HR was 106 bpm.  She was advised to start Toprol-XL 25 mg daily and return in 3 months for follow-up.        History of Present Illness: .   Brandy Haley is a very pleasant 68 y.o. female who is here today for follow-up of heart murmur and  hypertension.  She continues to have muscle fatigue, chest pain, and difficulty swallowing. She is on a liquid and soft diet. She reports chest pain has awakened her at times and is relieved by drinking fluids. She is currently receiving IVIG infusions every 28 days for polymyositis and is on metoprolol for heart rate control. Her coronary calcium score is high, indicating potential coronary artery disease. She has been unable to tolerate statins for elevated LDL cholesterol. Potassium is elevated and sodium is slightly low, possibly due to diet and fluid intake. She frequently drinks Propel and Boost.  Activity is limited by symptoms she believes are muscle fatigue secondary to polymyositis. She was recently browning meat for spaghetti and got very fatigued, diaphoretic and had to sit down to recover. She denies shortness of breath, orthopnea, PND, edema.   Discussed the use of AI scribe software for clinical note transcription with the patient, who gave verbal consent to proceed.   ROS: See HPI       Studies Reviewed: .         Risk Assessment/Calculations:             Physical Exam:   VS:  BP 120/76 (BP Location: Left Arm, Patient Position: Sitting, Cuff Size: Normal)   Pulse 97   Ht 5\' 1"  (1.549 m)   Wt 114 lb (51.7 kg)   SpO2 98%   BMI 21.54 kg/m    Wt Readings from Last 3 Encounters:  02/03/23 114 lb (51.7 kg)  01/04/23 106 lb (48.1 kg)  12/06/22 106 lb (48.1 kg)    GEN: Well nourished, well developed in no acute distress NECK: No JVD; No carotid bruits CARDIAC: RRR, 2/6 systolic murmur. No rubs, gallops RESPIRATORY:  Clear to auscultation without rales, wheezing or rhonchi  ABDOMEN: Soft, non-tender, non-distended EXTREMITIES:  No edema; No deformity     ASSESSMENT AND PLAN: .    Polymyositis: Prolonged illness and weakness with eventual diagnosis of polymyositis. She reports chronic muscle fatigue and weakness. Treatment with IVIG treatments every 28 days and recently  started on Methotrexate. Labs indicate elevated liver enzymes. She also has poor neck control. She is feeling better and has gained strength since starting treatment. She also has difficulty swallowing and is on a liquid diet. Management per [facility].  CAD: Significantly elevated coronary calcium score of 1108 (98th percentile) on CT 05/2022.  She reports chest pain that sometimes awakens her that is improved with drinking fluids.  She reports occasional episodes of becoming diaphoretic and having to sit down due to profound weakness. It is difficult to discern if these symptoms are due to polymyositis alone or if she is having angina. She is up and down stairs at home with no chest pain or shortness of breath. No prior ischemia evaluation. Through shared decision making and due to additional risk factors of hyperlipidemia and hypertension, we will get coronary CTA for ischemia evaluation. Will have her take an additional lopressor 50 mg 2 hours prior to test. Pt had abnormal coronary CT 02/14/23 that revealed concern for significant stenosis by FFR in mid LCx. No significant stenosis in LAD or RCA. Plan including risks of procedure discussed with patient and she agrees to proceed with cardiac catheterization.   Valve disease: Echocardiogram 09/09/2022 reveals mild MR, mild aortic sclerosis with minimal aortic stenosis (mean gradient of 11.5 mmHg), and trivial TR with normal LVEF 60 to 65%.  Systolic murmur noted on exam.  Chest pain as noted above, no shortness of breath, palpitations, presyncope or syncope.  We will continue to monitor clinically for now.  Hypertension: BP is well controlled.   Hyperlipidemia LDL goal < 70: Lipid panel completed 05/27/2022 with total cholesterol 243, HDL 36, LDL 160, triglycerides 252. She has been intolerant of multiple statins. Unclear as to whether she has tried ezetimibe.  She has a significantly elevated coronary calcium score. Need to consider novel lipid lowering options in  the setting of polymyositis.  Will plan to discuss further at next office visit.  Sinus tachycardia: She was put on metoprolol for elevated heart rate at last office visit 10/25/2022 by Dr. Elease Hashimoto.  HR remains slightly elevated today.  She is tolerating metoprolol without any concerning side effects. We will not up-titrate for concern for worsening fatigue on higher dose.   Informed Consent   Shared Decision Making/Informed Consent The risks [stroke (1 in 1000), death (1 in 1000), kidney failure [usually temporary] (1 in 500), bleeding (1 in 200), allergic reaction [possibly serious] (1 in 200)], benefits (diagnostic support and management of coronary artery disease) and alternatives of a cardiac catheterization were discussed in detail with Ms. Knoch and she is willing to proceed.           Dispo: 2-3 months with me/Request to see Dr. Excell Seltzer in the future (he sees her husband)  Signed, Eligha Bridegroom, NP-C

## 2023-02-03 ENCOUNTER — Encounter: Payer: Self-pay | Admitting: Nurse Practitioner

## 2023-02-03 ENCOUNTER — Ambulatory Visit: Payer: Medicare Other | Attending: Nurse Practitioner | Admitting: Nurse Practitioner

## 2023-02-03 VITALS — BP 120/76 | HR 97 | Ht 61.0 in | Wt 114.0 lb

## 2023-02-03 DIAGNOSIS — I358 Other nonrheumatic aortic valve disorders: Secondary | ICD-10-CM | POA: Diagnosis present

## 2023-02-03 DIAGNOSIS — M3320 Polymyositis, organ involvement unspecified: Secondary | ICD-10-CM | POA: Insufficient documentation

## 2023-02-03 DIAGNOSIS — I2510 Atherosclerotic heart disease of native coronary artery without angina pectoris: Secondary | ICD-10-CM | POA: Diagnosis not present

## 2023-02-03 DIAGNOSIS — E785 Hyperlipidemia, unspecified: Secondary | ICD-10-CM | POA: Diagnosis present

## 2023-02-03 DIAGNOSIS — R072 Precordial pain: Secondary | ICD-10-CM | POA: Diagnosis not present

## 2023-02-03 DIAGNOSIS — I10 Essential (primary) hypertension: Secondary | ICD-10-CM | POA: Insufficient documentation

## 2023-02-03 DIAGNOSIS — I340 Nonrheumatic mitral (valve) insufficiency: Secondary | ICD-10-CM | POA: Diagnosis present

## 2023-02-03 DIAGNOSIS — R000 Tachycardia, unspecified: Secondary | ICD-10-CM | POA: Insufficient documentation

## 2023-02-03 MED ORDER — METOPROLOL TARTRATE 50 MG PO TABS: ORAL_TABLET | ORAL | 0 refills | Status: DC

## 2023-02-03 NOTE — Patient Instructions (Signed)
Medication Instructions:   Your physician recommends that you continue on your current medications as directed. Please refer to the Current Medication list given to you today.   *If you need a refill on your cardiac medications before your next appointment, please call your pharmacy*   Lab Work:  None ordered.  If you have labs (blood work) drawn today and your tests are completely normal, you will receive your results only by: MyChart Message (if you have MyChart) OR A paper copy in the mail If you have any lab test that is abnormal or we need to change your treatment, we will call you to review the results.   Testing/Procedures:    Your cardiac CT will be scheduled at one of the below locations:   [facility] [address] [place], [place] [address] [phone]     If scheduled at [facility] or [facility], please arrive 15 mins early for check-in and test prep.  There is spacious parking and easy access to the radiology department from the [facility] entrance. Please enter here and check-in with the desk attendant.   Please follow these instructions carefully (unless otherwise directed):  An IV will be required for this test and Nitroglycerin will be given.     On the Night Before the Test: Be sure to Drink plenty of water. Do not consume any caffeinated/decaffeinated beverages or chocolate 12 hours prior to your test. Do not take any antihistamines 12 hours prior to your test.  On the Day of the Test: Drink plenty of water until 1 hour prior to the test. Do not eat any food 1 hour prior to test. You may take your regular medications prior to the test.  Take metoprolol (Lopressor) one (1) tablet by mouth two hours prior to test. FEMALES- please wear underwire-free bra if available, avoid dresses & tight clothing      After the Test: Drink plenty of water. After receiving  IV contrast, you may experience a mild flushed feeling. This is normal. On occasion, you may experience a mild rash up to 24 hours after the test. This is not dangerous. If this occurs, you can take Benadryl 25 mg and increase your fluid intake. If you experience trouble breathing, this can be serious. If it is severe call 911 IMMEDIATELY. If it is mild, please call our office.  We will call to schedule your test 2-4 weeks out understanding that some insurance companies will need an authorization prior to the service being performed.   For more information and frequently asked questions, please visit our website : [email or web address]  For non-scheduling related questions, please contact the cardiac imaging nurse navigator should you have any questions/concerns: Cardiac Imaging Nurse Navigators Direct Office Dial: [phone]   For scheduling needs, including cancellations and rescheduling, please call [place], [phone].    Follow-Up: At [facility], you and your health needs are our priority.  As part of our continuing mission to provide you with exceptional heart care, we have created designated Provider Care Teams.  These Care Teams include your primary Cardiologist (physician) and Advanced Practice Providers (APPs -  Physician Assistants and Nurse Practitioners) who all work together to provide you with the care you need, when you need it.  We recommend signing up for the patient portal called "MyChart".  Sign up information is provided on this After Visit Summary.  MyChart is used to connect with patients for Virtual Visits (Telemedicine).  Patients are able to view lab/test results, encounter notes, upcoming appointments, etc.  Non-urgent messages can be sent to your provider as well.   To learn more about what you can do with MyChart, go to [email or web address].    Your next appointment:   2 month(s)  Provider:   Eligha Bridegroom, NP         Other  Instructions   Will keep in touch to let you know if Dr. Excell Seltzer will except you as a new patient.     1st Floor: - Lobby - Registration  - Pharmacy  - Lab - Cafe  2nd Floor: - PV Lab - Diagnostic Testing (echo, CT, nuclear med)  3rd Floor: - Vacant  4th Floor: - TCTS (cardiothoracic surgery) - AFib Clinic - Structural Heart Clinic - Vascular Surgery  - Vascular Ultrasound  5th Floor: - HeartCare Cardiology (general and EP) - Clinical Pharmacy for coumadin, hypertension, lipid, weight-loss medications, and med management appointments    Valet parking services will be available as well.

## 2023-02-11 ENCOUNTER — Telehealth (HOSPITAL_COMMUNITY): Payer: Self-pay | Admitting: Emergency Medicine

## 2023-02-11 NOTE — Telephone Encounter (Signed)
Reaching out to patient to offer assistance regarding upcoming cardiac imaging study; pt verbalizes understanding of appt date/time, parking situation and where to check in, pre-test NPO status and medications ordered, and verified current allergies; name and call back number provided for further questions should they arise Rockwell Alexandria RN Navigator Cardiac Imaging [facility] [phone] office [phone] cell

## 2023-02-14 ENCOUNTER — Telehealth: Payer: Self-pay | Admitting: Nurse Practitioner

## 2023-02-14 ENCOUNTER — Ambulatory Visit
Admission: RE | Admit: 2023-02-14 | Discharge: 2023-02-14 | Disposition: A | Discharge status: Home / Self Care | Payer: Medicare Other | Source: Ambulatory Visit | Attending: Nurse Practitioner | Admitting: Nurse Practitioner

## 2023-02-14 ENCOUNTER — Other Ambulatory Visit: Payer: Self-pay | Admitting: Nurse Practitioner

## 2023-02-14 DIAGNOSIS — R072 Precordial pain: Secondary | ICD-10-CM | POA: Diagnosis present

## 2023-02-14 DIAGNOSIS — R0789 Other chest pain: Secondary | ICD-10-CM | POA: Insufficient documentation

## 2023-02-14 DIAGNOSIS — R931 Abnormal findings on diagnostic imaging of heart and coronary circulation: Secondary | ICD-10-CM

## 2023-02-14 MED ORDER — NITROGLYCERIN 0.4 MG SL SUBL
0.8000 mg | SUBLINGUAL_TABLET | Freq: Once | SUBLINGUAL | Status: AC
Start: 2023-02-14 — End: 2023-02-14
  Administered 2023-02-14: 0.8 mg via SUBLINGUAL
  Filled 2023-02-14: qty 25

## 2023-02-14 MED ORDER — METOPROLOL TARTRATE 5 MG/5ML IV SOLN
10.0000 mg | Freq: Once | INTRAVENOUS | Status: AC | PRN
  Administered 2023-02-14: 10 mg via INTRAVENOUS
  Filled 2023-02-14: qty 10

## 2023-02-14 MED ORDER — METOPROLOL TARTRATE 5 MG/5ML IV SOLN
INTRAVENOUS | Status: AC
  Filled 2023-02-14: qty 10

## 2023-02-14 MED ORDER — NITROGLYCERIN 0.4 MG SL SUBL
SUBLINGUAL_TABLET | SUBLINGUAL | Status: AC
  Filled 2023-02-14: qty 1

## 2023-02-14 MED ORDER — DILTIAZEM HCL 25 MG/5ML IV SOLN: 10.0000 mg | INTRAVENOUS | Status: DC | PRN

## 2023-02-14 MED ORDER — IOHEXOL 350 MG/ML SOLN
80.0000 mL | Freq: Once | INTRAVENOUS | Status: AC | PRN
  Administered 2023-02-14: 80 mL via INTRAVENOUS

## 2023-02-14 NOTE — Telephone Encounter (Signed)
Attempted to call patient to discuss coronary CT results.  Her home number is no longer in service.  I left a detailed message advising that I will call back tomorrow on her cell number.

## 2023-02-14 NOTE — Progress Notes (Signed)
Patient tolerated procedure well. Ambulate w/o difficulty. Denies any lightheadedness or being dizzy. Pt denies any pain at this time. Sitting in chair. Pt is encouraged to drink additional water throughout the day and reason explained to patient. Patient verbalized understanding and all questions answered. ABC intact. No further needs at this time. Discharge from procedure area w/o issues.

## 2023-02-15 NOTE — Telephone Encounter (Signed)
Call placed to pt regarding message below.  Left a message for pt to call back so we can get her procedure scheduled.

## 2023-02-15 NOTE — Telephone Encounter (Signed)
1. Coronary calcium score of 1334. This was 98th percentile for age and sex matched control.   2. Normal coronary origin with right dominance.   3. Severe proximal LCx stenosis (>70%).   4. Moderate proximal LAD stenosis (50-69%).   5. CAD-RADS 4 Severe stenosis. (70-99% or > 50% left main). Cardiac catheterization is recommended. Consider symptom-guided anti-ischemic pharmacotherapy as well as risk factor modification per guideline directed care.   6. Additional analysis with CT FFR will be submitted and reported separately.  IMPRESSION: 1. CT FFR analysis showed significant stenosis in the mid RCA. FFRct 0.92.   2.  Cardiac catheterization recommended.  Reviewed results with patient. Risks of cardiac cath reviewed and she is agreeable to proceed. I advised her to start aspirin 81 mg daily. At the time we spoke, she was getting an infusion and said she had lab work drawn today as well. We will await lab results to see if additional labs are needed. Would like to plan for cath Thursday or Friday or early next week.

## 2023-02-16 ENCOUNTER — Other Ambulatory Visit (HOSPITAL_BASED_OUTPATIENT_CLINIC_OR_DEPARTMENT_OTHER): Payer: Self-pay | Admitting: *Deleted

## 2023-02-16 ENCOUNTER — Other Ambulatory Visit: Payer: Self-pay | Admitting: *Deleted

## 2023-02-16 ENCOUNTER — Encounter (HOSPITAL_BASED_OUTPATIENT_CLINIC_OR_DEPARTMENT_OTHER): Payer: Self-pay

## 2023-02-16 DIAGNOSIS — E785 Hyperlipidemia, unspecified: Secondary | ICD-10-CM

## 2023-02-16 DIAGNOSIS — I358 Other nonrheumatic aortic valve disorders: Secondary | ICD-10-CM

## 2023-02-16 DIAGNOSIS — I2510 Atherosclerotic heart disease of native coronary artery without angina pectoris: Secondary | ICD-10-CM

## 2023-02-16 LAB — BASIC METABOLIC PANEL
BUN/Creatinine Ratio: 51 — ABNORMAL HIGH (ref 12–28)
BUN: 19 mg/dL (ref 8–27)
CO2: 25 mmol/L (ref 20–29)
Calcium: 9.5 mg/dL (ref 8.7–10.3)
Chloride: 99 mmol/L (ref 96–106)
Creatinine, Ser: 0.37 mg/dL — ABNORMAL LOW (ref 0.57–1.00)
Glucose: 114 mg/dL — ABNORMAL HIGH (ref 70–99)
Potassium: 4.5 mmol/L (ref 3.5–5.2)
Sodium: 132 mmol/L — ABNORMAL LOW (ref 134–144)
eGFR: 109 mL/min/{1.73_m2} (ref >59)

## 2023-02-16 NOTE — Addendum Note (Signed)
Addended by: Levi Aland on: 02/16/2023 01:06 PM   Modules accepted: Orders

## 2023-02-16 NOTE — Telephone Encounter (Signed)
S/w pt is aware LCP is tomorrow at 5:30 am.  Pt will get bmet today. Sent to auth to get pre cert. Lab placed in system and released. All instructions sent to pt on mychart.

## 2023-02-17 ENCOUNTER — Other Ambulatory Visit (HOSPITAL_COMMUNITY): Payer: Self-pay

## 2023-02-17 ENCOUNTER — Ambulatory Visit (HOSPITAL_COMMUNITY)
Admission: RE | Disposition: A | Discharge status: Home / Self Care | Payer: Self-pay | Source: Ambulatory Visit | Attending: Internal Medicine

## 2023-02-17 ENCOUNTER — Other Ambulatory Visit: Payer: Self-pay

## 2023-02-17 ENCOUNTER — Ambulatory Visit (HOSPITAL_COMMUNITY)
Admission: RE | Admit: 2023-02-17 | Discharge: 2023-02-17 | Disposition: A | Discharge status: Home / Self Care | Payer: Medicare Other | Source: Ambulatory Visit | Attending: Internal Medicine | Admitting: Internal Medicine

## 2023-02-17 DIAGNOSIS — Z7982 Long term (current) use of aspirin: Secondary | ICD-10-CM | POA: Diagnosis not present

## 2023-02-17 DIAGNOSIS — E785 Hyperlipidemia, unspecified: Secondary | ICD-10-CM | POA: Diagnosis not present

## 2023-02-17 DIAGNOSIS — M3320 Polymyositis, organ involvement unspecified: Secondary | ICD-10-CM | POA: Insufficient documentation

## 2023-02-17 DIAGNOSIS — E039 Hypothyroidism, unspecified: Secondary | ICD-10-CM | POA: Diagnosis not present

## 2023-02-17 DIAGNOSIS — I25110 Atherosclerotic heart disease of native coronary artery with unstable angina pectoris: Secondary | ICD-10-CM | POA: Insufficient documentation

## 2023-02-17 DIAGNOSIS — Z79899 Other long term (current) drug therapy: Secondary | ICD-10-CM | POA: Diagnosis not present

## 2023-02-17 DIAGNOSIS — J849 Interstitial pulmonary disease, unspecified: Secondary | ICD-10-CM | POA: Diagnosis not present

## 2023-02-17 DIAGNOSIS — I2510 Atherosclerotic heart disease of native coronary artery without angina pectoris: Secondary | ICD-10-CM

## 2023-02-17 DIAGNOSIS — Z955 Presence of coronary angioplasty implant and graft: Secondary | ICD-10-CM | POA: Diagnosis not present

## 2023-02-17 DIAGNOSIS — I10 Essential (primary) hypertension: Secondary | ICD-10-CM | POA: Insufficient documentation

## 2023-02-17 DIAGNOSIS — I209 Angina pectoris, unspecified: Secondary | ICD-10-CM

## 2023-02-17 DIAGNOSIS — E782 Mixed hyperlipidemia: Secondary | ICD-10-CM

## 2023-02-17 DIAGNOSIS — R079 Chest pain, unspecified: Secondary | ICD-10-CM

## 2023-02-17 DIAGNOSIS — R072 Precordial pain: Secondary | ICD-10-CM

## 2023-02-17 DIAGNOSIS — Z7902 Long term (current) use of antithrombotics/antiplatelets: Secondary | ICD-10-CM | POA: Diagnosis not present

## 2023-02-17 HISTORY — PX: CORONARY STENT INTERVENTION: CATH118234

## 2023-02-17 HISTORY — PX: LEFT HEART CATH AND CORONARY ANGIOGRAPHY: CATH118249

## 2023-02-17 LAB — POCT ACTIVATED CLOTTING TIME
Activated Clotting Time: 291 s
Activated Clotting Time: 366 s

## 2023-02-17 SURGERY — LEFT HEART CATH AND CORONARY ANGIOGRAPHY
Anesthesia: LOCAL

## 2023-02-17 MED ORDER — CLOPIDOGREL BISULFATE 75 MG PO TABS
ORAL_TABLET | ORAL | Status: AC
  Filled 2023-02-17: qty 8

## 2023-02-17 MED ORDER — CLOPIDOGREL BISULFATE 75 MG PO TABS
75.0000 mg | ORAL_TABLET | Freq: Every day | ORAL | 1 refills | Status: DC
  Filled 2023-02-17: qty 90, 90d supply, fill #0

## 2023-02-17 MED ORDER — LABETALOL HCL 5 MG/ML IV SOLN: 10.0000 mg | INTRAVENOUS | Status: DC | PRN

## 2023-02-17 MED ORDER — SODIUM CHLORIDE 0.9 % WEIGHT BASED INFUSION: 1.0000 mL/kg/h | INTRAVENOUS | Status: DC

## 2023-02-17 MED ORDER — HEPARIN SODIUM (PORCINE) 1000 UNIT/ML IJ SOLN
INTRAMUSCULAR | Status: AC
  Filled 2023-02-17: qty 10

## 2023-02-17 MED ORDER — NITROGLYCERIN 0.4 MG SL SUBL
0.4000 mg | SUBLINGUAL_TABLET | SUBLINGUAL | 2 refills | Status: AC | PRN
  Filled 2023-02-17: qty 25, 7d supply, fill #0

## 2023-02-17 MED ORDER — CLOPIDOGREL BISULFATE 75 MG PO TABS: 75.0000 mg | ORAL_TABLET | Freq: Every day | ORAL | Status: DC

## 2023-02-17 MED ORDER — HYDRALAZINE HCL 20 MG/ML IJ SOLN
INTRAMUSCULAR | Status: DC | PRN
  Administered 2023-02-17: 5 mg via INTRAVENOUS

## 2023-02-17 MED ORDER — ASPIRIN 81 MG PO CHEW: 81.0000 mg | CHEWABLE_TABLET | Freq: Every day | ORAL | Status: DC

## 2023-02-17 MED ORDER — MIDAZOLAM HCL 2 MG/2ML IJ SOLN
INTRAMUSCULAR | Status: DC | PRN
  Administered 2023-02-17 (×2): 1 mg via INTRAVENOUS

## 2023-02-17 MED ORDER — LIDOCAINE HCL (PF) 1 % IJ SOLN
INTRAMUSCULAR | Status: DC | PRN
  Administered 2023-02-17: 2 mL

## 2023-02-17 MED ORDER — ONDANSETRON HCL 4 MG/2ML IJ SOLN: 4.0000 mg | Freq: Four times a day (QID) | INTRAMUSCULAR | Status: DC | PRN

## 2023-02-17 MED ORDER — HYDRALAZINE HCL 20 MG/ML IJ SOLN: 10.0000 mg | INTRAMUSCULAR | Status: DC | PRN

## 2023-02-17 MED ORDER — HYDRALAZINE HCL 20 MG/ML IJ SOLN
INTRAMUSCULAR | Status: AC
  Filled 2023-02-17: qty 1

## 2023-02-17 MED ORDER — LIDOCAINE HCL (PF) 1 % IJ SOLN
INTRAMUSCULAR | Status: AC
  Filled 2023-02-17: qty 30

## 2023-02-17 MED ORDER — VERAPAMIL HCL 2.5 MG/ML IV SOLN
INTRAVENOUS | Status: AC
  Filled 2023-02-17: qty 2

## 2023-02-17 MED ORDER — IOHEXOL 350 MG/ML SOLN
INTRAVENOUS | Status: DC | PRN
  Administered 2023-02-17: 140 mL

## 2023-02-17 MED ORDER — NITROGLYCERIN 1 MG/10 ML FOR IR/CATH LAB
INTRA_ARTERIAL | Status: AC
  Filled 2023-02-17: qty 10

## 2023-02-17 MED ORDER — HEPARIN (PORCINE) IN NACL 1000-0.9 UT/500ML-% IV SOLN
INTRAVENOUS | Status: DC | PRN
  Administered 2023-02-17 (×2): 500 mL

## 2023-02-17 MED ORDER — HEPARIN SODIUM (PORCINE) 1000 UNIT/ML IJ SOLN
INTRAMUSCULAR | Status: DC | PRN
  Administered 2023-02-17: 2000 [IU] via INTRAVENOUS
  Administered 2023-02-17: 5000 [IU] via INTRAVENOUS

## 2023-02-17 MED ORDER — FENTANYL CITRATE (PF) 100 MCG/2ML IJ SOLN
INTRAMUSCULAR | Status: DC | PRN
  Administered 2023-02-17 (×2): 25 ug via INTRAVENOUS

## 2023-02-17 MED ORDER — CLOPIDOGREL BISULFATE 300 MG PO TABS
ORAL_TABLET | ORAL | Status: DC | PRN
  Administered 2023-02-17: 600 mg via ORAL

## 2023-02-17 MED ORDER — FENTANYL CITRATE (PF) 100 MCG/2ML IJ SOLN
INTRAMUSCULAR | Status: AC
  Filled 2023-02-17: qty 2

## 2023-02-17 MED ORDER — SODIUM CHLORIDE 0.9 % WEIGHT BASED INFUSION: 3.0000 mL/kg/h | INTRAVENOUS | Status: DC

## 2023-02-17 MED ORDER — MIDAZOLAM HCL 2 MG/2ML IJ SOLN
INTRAMUSCULAR | Status: AC
  Filled 2023-02-17: qty 2

## 2023-02-17 MED ORDER — VERAPAMIL HCL 2.5 MG/ML IV SOLN
INTRAVENOUS | Status: DC | PRN
  Administered 2023-02-17: 10 mL via INTRA_ARTERIAL

## 2023-02-17 MED ORDER — ASPIRIN 81 MG PO CHEW: 81.0000 mg | CHEWABLE_TABLET | ORAL | Status: DC

## 2023-02-17 MED ORDER — ACETAMINOPHEN 325 MG PO TABS: 650.0000 mg | ORAL_TABLET | ORAL | Status: DC | PRN

## 2023-02-17 SURGICAL SUPPLY — 27 items
BALLN EMERGE MR 2.0X12 (BALLOONS) ×1
BALLN EMERGE MR 2.5X12 (BALLOONS) ×1
BALLN WOLVERINE 2.50X10 (BALLOONS) ×1
BALLN ~~LOC~~ EMERGE MR 2.5X12 (BALLOONS) ×2
BALLOON EMERGE MR 2.0X12 (BALLOONS) IMPLANT
BALLOON EMERGE MR 2.5X12 (BALLOONS) IMPLANT
BALLOON WOLVERINE 2.50X10 (BALLOONS) IMPLANT
BALLOON ~~LOC~~ EMERGE MR 2.5X12 (BALLOONS) IMPLANT
CATH GUIDELINER COAST (CATHETERS) IMPLANT
CATH INFINITI 5 FR JL3.5 (CATHETERS) IMPLANT
CATH INFINITI 5FR ANG PIGTAIL (CATHETERS) IMPLANT
CATH INFINITI AMBI 6FR TG (CATHETERS) IMPLANT
CATH LAUNCHER 6FR EBU 3 (CATHETERS) IMPLANT
GLIDESHEATH SLEND SS 6F .021 (SHEATH) IMPLANT
GUIDEWIRE INQWIRE 1.5J.035X260 (WIRE) IMPLANT
GUIDEWIRE VAS SION BLUE 190 (WIRE) IMPLANT
INQWIRE 1.5J .035X260CM (WIRE) ×1
KIT ENCORE 26 ADVANTAGE (KITS) IMPLANT
KIT HEMO VALVE WATCHDOG (MISCELLANEOUS) IMPLANT
PACK CARDIAC CATHETERIZATION (CUSTOM PROCEDURE TRAY) ×2 IMPLANT
SET ATX-X65L (MISCELLANEOUS) IMPLANT
STENT SYNERGY XD 2.50X12 (Permanent Stent) IMPLANT
STENT SYNERGY XD 2.50X16 (Permanent Stent) IMPLANT
SYNERGY XD 2.50X12 (Permanent Stent) ×1 IMPLANT
SYNERGY XD 2.50X16 (Permanent Stent) ×1 IMPLANT
WIRE ASAHI PROWATER 180CM (WIRE) IMPLANT
WIRE EMERALD 3MM-J .035X260CM (WIRE) IMPLANT

## 2023-02-17 NOTE — Interval H&P Note (Signed)
History and Physical Interval Note:  02/17/2023 6:45 AM  Brandy Haley  has presented today for surgery, with the diagnosis of abnl ct.  The various methods of treatment have been discussed with the patient and family. After consideration of risks, benefits and other options for treatment, the patient has consented to  Procedure(s): LEFT HEART CATH AND CORONARY ANGIOGRAPHY (N/A) as a surgical intervention.  The patient's history has been reviewed, patient examined, no change in status, stable for surgery.  I have reviewed the patient's chart and labs.  Questions were answered to the patient's satisfaction.     [name]

## 2023-02-17 NOTE — Progress Notes (Signed)
Pt was educated on stent card, stent location, Antiplatelet and ASA use, wt restrictions, no baths/daily wash-ups, s/s of infection, ex guidelines, s/s to stop exercising, NTG use and calling 911, heart healthy diet, risk factors and CRPII. Pt received materials on exercise, diet, and CRPII. Will refer to [facility].   Faustino Congress 02/17/2023 10:50 AM  Pt may be unable to do CR d/t polymyositis

## 2023-02-17 NOTE — Discharge Summary (Signed)
Discharge Summary for Same Day PCI   Patient ID: Brandy Haley MRN: [id number]; DOB: January 29, 1955  Admit date: 02/17/2023 Discharge date: 02/17/2023  Primary Care Provider: Enid Baas, MD  Primary Cardiologist: Kristeen Miss, MD  Primary Electrophysiologist:  None   Discharge Diagnoses    Principal Problem:   Chest pain Active Problems:   CAD (coronary artery disease)   Diagnostic Studies/Procedures    Cardiac Catheterization 02/17/2023:    1st Mrg lesion is 80% stenosed.   1st Diag lesion is 80% stenosed.   A stent was successfully placed.   A stent was successfully placed.   in the main branch and side branch.   Angioplasty was performed in the main branch and side branch. .   Post intervention, there is a 0% residual stenosis.   1.  High-grade obtuse marginal lesion treated with 2 overlapping drug-eluting stents requiring complex PCI with GuideLiner and multiple wires. 2.  High-grade proximal lesion of first diagonal; this should be treated medically. 3.  Mild diffuse disease of dominant right coronary artery. 4.  LVEDP of 17 mmHg.   Recommendation: Dual antiplatelet therapy for 6 months then Plavix monotherapy indefinitely.   _____________   History of Present Illness     Brandy Haley is a 68 y.o. female with PMH of CAD with CAC score of 1108, hypothyroidism, ILD, mild MR, polymyocitis who was seen in the office on 1/16. Seen in 2019 by Dr. Antoine Poche for hyperlipidemia. She underwent ETT 10/04/2017 which did not reveal any evidence of ischemia.  She had elevated BP response and was encouraged to closely follow BP at home.   Referred back to cardiology and seen by Dr. Elease Hashimoto 08/23/2022.  She was seen with her daughter [name] who is Charity fundraiser at [employer].  Reported a very rapid decline in her functional status with loss of muscle mass and strength, feeling profoundly weak.  Weight loss of 35 lbs over 6 months, and significantly diminished oral intake. Scheduled to start IgG  infusions soon. Tested positive for RA and psoriasis and took autoimmune medications. Her physician wanted to ensure no risk for CHF. 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 Had lots of post op complications. Daughter reported she aspirates regularly; she chokes while eating. Heart murmur was noted on exam. Subsequently,  echocardiogram 09/09/22 revealed normal LV function, mild MR, trivial TR, and mild aortic sclerosis with minimal AAS (mean gradient of 11.5 mmHg.   At follow-up visit 10/25/2022 she was feeling stronger and felt that IgG and prednisone were helping. BP was elevated at 140/80 and HR was 106 bpm.  She was advised to start Toprol-XL 25 mg daily and return in 3 months for follow-up.  At most recent follow up visit she continued to have muscle fatigue, chest pain and difficulty swallowing. Her symptoms were difficult to discern therefore was set up for outpatient CCTa that showed significant stenosis by FFR in mid LCx. No significant stenosis in LAD or RCA. Given this finding she was set up for outpatient cardiac cath.   Hospital Course     The patient underwent cardiac cath as noted above with high grade stenosis of 1st OM with PCI/DES x2 with multiple wires and guideliner. D1 80% lesion to be treated medically. Plan for DAPT with ASA/plavix for at least 6 months. The patient was seen by cardiac rehab while in short stay. There were no observed complications post cath. Radial cath site was  re-evaluated prior to discharge and found to be stable without any complications. Instructions/precautions regarding cath site care were given prior to discharge.  Lakeshia P Milam was seen by Dr. Lynnette Caffey and determined stable for discharge home. Follow up with our office has been arranged. Medications are listed below. Pertinent changes include addition plavix and SL NTG. Consider lipid clinic referral as an  outpatient.  _____________  Cath/PCI Registry Performance & Quality Measures: Aspirin prescribed? - Yes ADP Receptor Inhibitor (Plavix/Clopidogrel, Brilinta/Ticagrelor or Effient/Prasugrel) prescribed (includes medically managed patients)? - Yes High Intensity Statin (Lipitor 40-80mg  or Crestor 20-40mg ) prescribed? - No - intolerant statins For EF <40%, was ACEI/ARB prescribed? - Not Applicable (EF >/= 40%) For EF <40%, Aldosterone Antagonist (Spironolactone or Eplerenone) prescribed? - Not Applicable (EF >/= 40%) Cardiac Rehab Phase II ordered (Included Medically managed Patients)? - Yes  _____________   Discharge Vitals Blood pressure 139/84, pulse 85, temperature 99.5 F (37.5 C), temperature source Oral, resp. rate 19, height 5\' 1"  (1.549 m), weight 53.1 kg, SpO2 99%.  Filed Weights   02/17/23 0620  Weight: 53.1 kg    Last Labs & Radiologic Studies    CBC No results for input(s): "WBC", "NEUTROABS", "HGB", "HCT", "MCV", "PLT" in the last 72 hours. Basic Metabolic Panel Recent Labs    09/81/19 1346  NA 132*  K 4.5  CL 99  CO2 25  GLUCOSE 114*  BUN 19  CREATININE 0.37*  CALCIUM 9.5   Liver Function Tests No results for input(s): "AST", "ALT", "ALKPHOS", "BILITOT", "PROT", "ALBUMIN" in the last 72 hours. No results for input(s): "LIPASE", "AMYLASE" in the last 72 hours. High Sensitivity Troponin:   No results for input(s): "TROPONINIHS" in the last 720 hours.  BNP Invalid input(s): "POCBNP" D-Dimer No results for input(s): "DDIMER" in the last 72 hours. Hemoglobin A1C No results for input(s): "HGBA1C" in the last 72 hours. Fasting Lipid Panel No results for input(s): "CHOL", "HDL", "LDLCALC", "TRIG", "CHOLHDL", "LDLDIRECT" in the last 72 hours. Thyroid Function Tests No results for input(s): "TSH", "T4TOTAL", "T3FREE", "THYROIDAB" in the last 72 hours.  Invalid input(s): "FREET3" _____________  CARDIAC CATHETERIZATION Addendum Date: 02/17/2023   1st Mrg  lesion is 80% stenosed.   1st Diag lesion is 80% stenosed.   A stent was successfully placed.   A stent was successfully placed.   in the main branch and side branch.   Angioplasty was performed in the main branch and side branch. .   Post intervention, there is a 0% residual stenosis. 1.  High-grade obtuse marginal lesion treated with 2 overlapping drug-eluting stents requiring complex PCI with GuideLiner and multiple wires. 2.  High-grade proximal lesion of first diagonal; this should be treated medically. 3.  Mild diffuse disease of dominant right coronary artery. 4.  LVEDP of 17 mmHg. Recommendation: Dual antiplatelet therapy for 6 months then Plavix monotherapy indefinitely.  Result Date: 02/17/2023   1st Mrg lesion is 80% stenosed.   1st Diag lesion is 80% stenosed.   A stent was successfully placed.   A stent was successfully placed.   in the main branch and side branch.   Angioplasty was performed in the main branch and side branch. .   Post intervention, there is a 0% residual stenosis. 1.  High-grade obtuse marginal lesion treated with 2 overlapping drug-eluting stents requiring complex PCI with GuideLiner multiple wires. 2.  High-grade proximal lesion of first diagonal; this should be treated medically. 3.  Mild diffuse disease of dominant right coronary artery. 4.  LVEDP of 17 mmHg. Recommendation: Dual antiplatelet therapy for 6 months then Plavix monotherapy indefinitely.   CT CORONARY FFR DATA PREP & FLUID ANALYSIS Result Date: 02/14/2023 EXAM: CT FFR ANALYSIS CLINICAL DATA:  Abnormal CCTA FINDINGS: FFRct analysis was performed on the original cardiac CT angiogram dataset. Diagrammatic representation of the FFRct analysis is provided in a separate PDF document in PACS. This dictation was created using the PDF document and an interactive 3D model of the results. 3D model is not available in the EMR/PACS. Normal FFR range is >0.80. 1. Left Main:  No significant stenosis. 2. LAD: No significant  stenosis.  FFRct 0.87 3. LCX: significant stenosis in the mid LCx.  FFRct 0.74 4. RCA: No significant stenosis.  FFRct 0.92 IMPRESSION: 1. CT FFR analysis showed significant stenosis in the mid RCA. FFRct 0.92. 2.  Cardiac catheterization recommended. Electronically Signed   By: Debbe Odea M.D.   On: 02/14/2023 16:58   CT CORONARY MORPH W/CTA COR W/SCORE W/CA W/CM &/OR WO/CM Result Date: 02/14/2023 CLINICAL DATA:  Coronary calcium score EXAM: Cardiac/Coronary  CTA TECHNIQUE: The patient was scanned on a Siemens Somatom go.Top scanner. : A retrospective scan was triggered in the ascending thoracic aorta. Axial non-contrast 3 mm slices were carried out through the heart. The data set was analyzed on a dedicated work station and scored using the Agatson method. Gantry rotation speed was 330 msecs and collimation was .6 mm. 50mg  of metoprolol and 0.8 mg of sl NTG was given. The 3D data set was reconstructed in 5% intervals of the 60-95 % of the R-R cycle. Diastolic phases were analyzed on a dedicated work station using MPR, MIP and VRT modes. The patient received 80 cc of contrast. FINDINGS: Aorta:  Normal size.  No calcifications.  No dissection. Aortic Valve:  Trileaflet. Mild calcifications. Coronary Arteries:  Normal coronary origin.  Right dominance. RCA is a dominant artery. There is calcified plaque proximally causing mild stenosis (25-49%). Left main gives rise to LAD and LCX arteries. LM has no disease. LAD has calcified plaque proximally causing moderate stenosis (50-69%). LCX is a non-dominant artery. There is calcified and non-calcified plaque proximally causing severe stenosis (>70%). Other findings: Normal pulmonary vein drainage into the left atrium. Normal left atrial appendage without a thrombus. Normal size of the pulmonary artery. IMPRESSION: 1. Coronary calcium score of 1334. This was 98th percentile for age and sex matched control. 2. Normal coronary origin with right dominance. 3. Severe  proximal LCx stenosis (>70%). 4. Moderate proximal LAD stenosis (50-69%). 5. CAD-RADS 4 Severe stenosis. (70-99% or > 50% left main). Cardiac catheterization is recommended. Consider symptom-guided anti-ischemic pharmacotherapy as well as risk factor modification per guideline directed care. 6. Additional analysis with CT FFR will be submitted and reported separately. Electronically Signed   By: Debbe Odea M.D.   On: 02/14/2023 13:28    Disposition   Pt is being discharged home today in good condition.  Follow-up Plans & Appointments     Discharge Instructions     AMB Referral to Advanced Lipid Disorders Clinic   Complete by: As directed    Reason for referral: Patients with statin intolerance (failed 2 statins, one of which must be a high potency statin) Comment - Kissing stents >> new plavix (dapt x63mo > plavix mono). Patient has tried Crestor and Lipitor in the past. Experienced muscle pain with both medications. Additionally, LFTs increased so provider recommended discontinuing. LDL 154 Mar 2024. CAC score 1334   Provider to see  patient: PharmD   Internal Lipid Clinic Referral Scheduling  Internal lipid clinic referrals are providers within [facility], who wish to refer established patients for routine management (help in starting PCSK9 inhibitor therapy) or advanced therapies.  Internal MD referral criteria:              1. All patients with LDL>190 mg/dL  2. All patients with Triglycerides >500 mg/dL  3. Patients with suspected or confirmed heterozygous familial hyperlipidemia (HeFH) or homozygous familial hyperlipidemia (HoFH)  4. Patients with family history of suspicious for genetic dyslipidemia desiring genetic testing  5. Patients refractory to standard guideline based therapy  6. Patients with statin intolerance (failed 2 statins, one of which must be a high potency statin)  7. Patients who the provider desires to be seen by MD   Internal PharmD referral criteria:   1.  Follow-up patients for medication management  2. Follow-up for compliance monitoring  3. Patients for drug education  4. Patients with statin intolerance  5. PCSK9 inhibitor education and prior authorization approvals  6. Patients with triglycerides <500 mg/dL  External Lipid Clinic Referral  External lipid clinic referrals are for providers outside of [facility], considered new clinic patients - automatically routed to MD schedule   Amb Referral to Cardiac Rehabilitation   Complete by: As directed    Diagnosis: Coronary Stents   After initial evaluation and assessments completed: Virtual Based Care may be provided alone or in conjunction with Phase 2 Cardiac Rehab based on patient barriers.: Yes   Intensive Cardiac Rehabilitation (ICR) [facility] location only OR Traditional Cardiac Rehabilitation (TCR) *If criteria for ICR are not met will enroll in TCR [facility] only): Yes        Discharge Medications   Allergies as of 02/17/2023       Reactions   Cefdinir Nausea And Vomiting   Ciprofloxacin Hives   Infliximab    "Went into shock"   Pyridium [phenazopyridine Hcl] Nausea And Vomiting   Statins Other (See Comments)   Patient has tried Crestor and Lipitor in the past. Experienced muscle pain with both medications. Additionally, LFTs increased so provider recommended discontinuing.    Tizanidine Other (See Comments)   Hypotension        Medication List     TAKE these medications    aspirin EC 81 MG tablet Take 81 mg by mouth daily. Swallow whole.   benazepril 10 MG tablet Commonly known as: LOTENSIN Take 10 mg by mouth daily.   clopidogrel 75 MG tablet Commonly known as: Plavix Take 1 tablet (75 mg total) by mouth daily.   COLLAGEN PO Take 1 Scoop by mouth daily.   cyanocobalamin 1000 MCG tablet Take 1,000 mcg by mouth daily.   folic acid 1 MG tablet Commonly known as: FOLVITE Take 1 mg by mouth daily.   levothyroxine 50 MCG tablet Commonly known as:  SYNTHROID Take 50 mcg by mouth daily before breakfast.   Magnesium 400 MG Tabs Take 400 mg by mouth daily.   metoprolol succinate 25 MG 24 hr tablet Commonly known as: Toprol XL Take 1 tablet (25 mg total) by mouth daily.   nitroGLYCERIN 0.4 MG SL tablet Commonly known as: NITROSTAT Place 1 tablet (0.4 mg total) under the tongue every 5 (five) minutes as needed.   oxyCODONE 5 MG immediate release tablet Commonly known as: Oxy IR/ROXICODONE Take 5 mg by mouth 3 (three) times daily as needed for severe pain (pain score 7-10).   pantoprazole 40 MG tablet Commonly  known as: PROTONIX Take 40 mg by mouth daily as needed (heartburn).   predniSONE 5 MG tablet Commonly known as: DELTASONE Take 20 mg by mouth daily with breakfast.   Vitamin D 50 MCG (2000 UT) tablet Take 2,000 Units by mouth daily.           Allergies Allergies  Allergen Reactions   Cefdinir Nausea And Vomiting   Ciprofloxacin Hives   Infliximab     "Went into shock"   Pyridium [Phenazopyridine Hcl] Nausea And Vomiting   Statins Other (See Comments)    Patient has tried Crestor and Lipitor in the past. Experienced muscle pain with both medications. Additionally, LFTs increased so provider recommended discontinuing.    Tizanidine Other (See Comments)    Hypotension     Outstanding Labs/Studies   N/a   Duration of Discharge Encounter   Greater than 30 minutes including physician time.  Signed, Laverda Page, NP 02/17/2023, 2:54 PM

## 2023-02-17 NOTE — Discharge Instructions (Signed)

## 2023-02-18 ENCOUNTER — Encounter (HOSPITAL_COMMUNITY): Payer: Self-pay | Admitting: Internal Medicine

## 2023-02-18 MED FILL — Nitroglycerin IV Soln 100 MCG/ML in D5W: INTRA_ARTERIAL | Qty: 10 | Status: AC

## 2023-02-22 ENCOUNTER — Telehealth (HOSPITAL_COMMUNITY): Payer: Self-pay

## 2023-02-22 NOTE — Telephone Encounter (Signed)
Called and spoke with pt in regards to CR, pt stated she is not interested at this time due to not driving and other health issues.   Closed referral

## 2023-03-02 ENCOUNTER — Telehealth: Payer: Self-pay | Admitting: Cardiovascular Disease

## 2023-03-02 ENCOUNTER — Telehealth: Payer: Self-pay | Admitting: Pharmacy Technician

## 2023-03-02 ENCOUNTER — Other Ambulatory Visit (HOSPITAL_COMMUNITY): Payer: Self-pay

## 2023-03-02 MED ORDER — PRASUGREL HCL 10 MG PO TABS: 10.0000 mg | ORAL_TABLET | Freq: Every day | ORAL | 3 refills | Status: DC

## 2023-03-02 NOTE — Telephone Encounter (Signed)
Art Buff, CPhT  03/02/23 10:31 AM Note Pharmacy Patient Advocate Encounter   Received notification from Patient Advice Request messages that prior authorization for effient - once daily is required/requested.   Insurance verification completed.   The patient is insured through [place] .    Per test claim: The current 03/02/23 day co-pay is, $22.14 one month.  No PA needed at this time. This test claim was processed through [facility]- copay amounts may vary at other pharmacies due to pharmacy/plan contracts, or as the patient moves through the different stages of their insurance plan.        Called and spoke with daughter and informed her that Effient has been called into pharmacy.

## 2023-03-02 NOTE — Telephone Encounter (Signed)
Pt c/o medication issue:  1. Name of Medication: clopidogrel (PLAVIX) 75 MG tablet   2. How are you currently taking this medication (dosage and times per day)?   3. Are you having a reaction (difficulty breathing--STAT)?   4. What is your medication issue?  Patient's daughter is requesting call back to get update for medications she may be switching to since she can not take this medication. States she has been without a blood thinner for a few days after having stents placed. Requesting call back with solution.

## 2023-03-02 NOTE — Telephone Encounter (Addendum)
Pharmacy Patient Advocate Encounter   Received notification from Patient Advice Request messages that prior authorization for effient - once daily is required/requested.   Insurance verification completed.   The patient is insured through [place] .    Per test claim: The current 03/02/23 day co-pay is, $22.14 one month.  No PA needed at this time. This test claim was processed through [facility]- copay amounts may vary at other pharmacies due to pharmacy/plan contracts, or as the patient moves through the different stages of their insurance plan.

## 2023-03-02 NOTE — Telephone Encounter (Addendum)
Pharmacy Patient Advocate Encounter   Received notification from Patient Advice Request messages that prior authorization for brilinta 90 MG -twice daily is required/requested.   Insurance verification completed.   The patient is insured through [place] .   Per test claim: The current 03/02/23 day co-pay is, $437.16- one month (DEDUCTIBLE).  No PA needed at this time. This test claim was processed through [facility]- copay amounts may vary at other pharmacies due to pharmacy/plan contracts, or as the patient moves through the different stages of their insurance plan.

## 2023-03-05 NOTE — Progress Notes (Unsigned)
 Cardiology Office Note:  .   Date:  03/08/2023  ID:  Brandy Haley, DOB 06/10/1955, MRN [id number] PCP: Brandy Haley  [facility] Providers Cardiologist:  Brandy Miss, Haley    Patient Profile: .      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 Antoine Haley for hyperlipidemia. She underwent ETT 10/04/2017 which did not reveal any evidence of ischemia.  She had elevated BP response and was encouraged to closely follow BP at home.  Referred back to cardiology and seen by Dr. Elease Haley 08/23/2022.  She was seen with her daughter Brandy Haley who is Brandy Haley at [employer].  Reported a very rapid decline in her functional status with loss of muscle mass and strength, feeling profoundly weak.  Weight loss of 35 lbs over 6 months, and significantly diminished oral intake. Scheduled to start IgG infusions soon. Tested positive for RA and psoriasis and took autoimmune medications. Her physician wanted to ensure no risk for CHF. 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 Had lots of post op complications. Daughter reports she aspirates regularly; she chokes while eating. Heart murmur was noted on exam. Subsequently,  echocardiogram 09/09/22 revealed normal LV function, mild MR, trivial TR, and mild aortic sclerosis with minimal AAS (mean gradient of 11.5 mmHg.  At follow-up visit 10/25/2022 she was feeling stronger and felt that IgG and prednisone were helping. BP was elevated at 140/80 and HR was 106 bpm.  She was advised to start Toprol-XL 25 mg daily and return in 3 months for follow-up.  Seen by me on 02/03/2023 for follow-up of heart murmur and hypertension. Continuing to have muscle fatigue, chest pain,  and difficulty swallowing. She was on a liquid and soft diet. Reported chest pain awakens her at times and is relieved by drinking fluids. Receiving IVIG infusions every 28 days for polymyositis and on metoprolol for heart rate control. Her coronary calcium score is high, indicating potential coronary artery disease. She has been unable to tolerate statins for elevated LDL cholesterol. Potassium is elevated and sodium is slightly low, possibly due to diet and fluid intake. She frequently drinks Propel and Boost.  Activity is limited by symptoms she believes are muscle fatigue secondary to polymyositis. She was recently browning meat for spaghetti and got very fatigued, diaphoretic and had to sit down to recover. She denied shortness of breath, orthopnea, PND, edema. Coronary CTA was ordered for evaluation of ischemia and revealed concern for significant stenosis by FFR and mid LCx.  She additionally had disease in LAD and RCA that was not flow-limiting based on FFR.    She underwent cardiac catheterization on 02/17/2023 which revealed high-grade obtuse marginal lesion treated with 2 overlapping drug-eluting stents requiring complex PCI with GuideLiner and multiple wires, high-grade proximal lesion of first diagonal to be treated medically, mild diffuse disease of dominant RCA, LVEDP 17 mmHg.  Plan for DAPT with Plavix and aspirin for 6 months and then Plavix monotherapy indefinitely.  She contacted our office 02/28/2023 to report rash on face, eyelids, arms, and thigh she felt was associated with Plavix.  She was switched to prasugrel 10 mg daily with a loading dose of 60 mg.        History  of Present Illness: .   Brandy Haley is a very pleasant 68 y.o. female who is here today with her daugher, Brandy Haley. She is feeling well today, better since stopping Plavix and starting Effient. She has a significant rash and swelling after starting Plavix including rash on face, arms, and legs, as well as swelling and joint  pain. She also had pain described as excruciating and all-encompassing, affecting her from head to toe. Her polymyositis is currently managed with IVIG infusions, but methotrexate was recently discontinued due to concerns about liver function. She is planning to have infusions every 3 weeks soon. She reports her HR has been erratic, with significant fluctuations noted on her smart watch from 40s at rest to > 120 bpm with minimal activity.  She is not able to exercise on a routine basis due to the polymyositis but remains active at home doing housework. No significant palpitations, presyncope or syncope. She denies chest pain, SOB, orthopnea, edema or PND. Reports she feels improvement since stent placement in that she sweats less with activity, such as preparing meals.    Discussed the use of AI scribe software for clinical note transcription with the patient, who gave verbal consent to proceed.   ROS: See HPI       Studies Reviewed: .         Risk Assessment/Calculations:             Physical Exam:   VS:  BP (!) 110/50   Pulse 60   Ht 5\' 2"  (1.575 m)   Wt 116 lb 12.8 oz (53 kg)   SpO2 96%   BMI 21.36 kg/m    Wt Readings from Last 3 Encounters:  03/08/23 116 lb 12.8 oz (53 kg)  02/17/23 117 lb (53.1 kg)  02/03/23 114 lb (51.7 kg)    GEN: Well nourished, well developed in no acute distress NECK: No JVD; No carotid bruits CARDIAC: RRR, 2/6 systolic murmur. No rubs, gallops RESPIRATORY:  Clear to auscultation without rales, wheezing or rhonchi  ABDOMEN: Soft, non-tender, non-distended EXTREMITIES:  No edema; No deformity     ASSESSMENT AND PLAN: .    CAD without angina: Abnormal coronary CTA which led to LHC that revealed 80% stenosis first marginal treated with 2 overlapping drug-eluting stents requiring complex PCI and high grade lesion of prox first diagonal to be treated medically. She denies chest pain, dyspnea, or other symptoms concerning for angina. No indication for  further ischemic evaluation at this time.  She unfortunately had a significant reaction to Plavix but is doing well on Effient.  No bleeding concerns. We will continue DAPT with asa and Effient.   Polymyositis: Treatment with IVIG managed by [facility]. Methotrexate was stopped due to elevated liver enzymes. She is overall feeling well with stable symptoms of fatigue and muscle pain. No acute concerns today.   Valve disease: Mild MR, mild aortic sclerosis with minimal aortic stenosis (mean gradient of 11.5 mmHg), and trivial TR with normal LVEF 60 to 65% on TTE 09/09/22. Systolic murmur noted on exam. She is asymptomatic. We will continue to monitor clinically for now.  Hypertension: BP is well controlled, soft at times. We will continue current antihypertensive therapy including benazepril and metoprolol. Renal function is stable on labs completed 02/16/23.   Hyperlipidemia LDL goal < 55: Lipid panel completed 05/27/2022 with total cholesterol 243, HDL 36, LDL 160, triglycerides 252. Intolerant of multiple statins. Need to consider novel lipid lowering options in the setting of polymyositis. She  does not want to start a new medication today due to recent severe reaction to Plavix. Will get updated lipid panel in the next few weeks. She has an appointment with Pharm D in March.   Sinus tachycardia: Occasional elevations in HR that are not sustained. She denies lightheadedness, presyncope, syncope associated with elevated HR.  We will continue metoprolol at 25 mg daily due to occasional episodes of bradycardia at rest. Advised her to notify us with sustained HR > 120 bpm.            Disposition: 6 months - she is requesting to see Dr. Excell Seltzer who is her husband's cardiologist (transfer from [place])  Signed, Eligha Bridegroom, NP-C

## 2023-03-08 ENCOUNTER — Encounter: Payer: Self-pay | Admitting: Nurse Practitioner

## 2023-03-08 ENCOUNTER — Ambulatory Visit: Payer: Medicare Other | Attending: Nurse Practitioner | Admitting: Nurse Practitioner

## 2023-03-08 ENCOUNTER — Other Ambulatory Visit: Payer: Self-pay | Admitting: *Deleted

## 2023-03-08 VITALS — BP 110/50 | HR 60 | Ht 62.0 in | Wt 116.8 lb

## 2023-03-08 DIAGNOSIS — I340 Nonrheumatic mitral (valve) insufficiency: Secondary | ICD-10-CM

## 2023-03-08 DIAGNOSIS — Z955 Presence of coronary angioplasty implant and graft: Secondary | ICD-10-CM

## 2023-03-08 DIAGNOSIS — I10 Essential (primary) hypertension: Secondary | ICD-10-CM | POA: Diagnosis present

## 2023-03-08 DIAGNOSIS — I358 Other nonrheumatic aortic valve disorders: Secondary | ICD-10-CM

## 2023-03-08 DIAGNOSIS — R000 Tachycardia, unspecified: Secondary | ICD-10-CM | POA: Diagnosis present

## 2023-03-08 DIAGNOSIS — E785 Hyperlipidemia, unspecified: Secondary | ICD-10-CM | POA: Diagnosis present

## 2023-03-08 DIAGNOSIS — I2510 Atherosclerotic heart disease of native coronary artery without angina pectoris: Secondary | ICD-10-CM

## 2023-03-08 DIAGNOSIS — M3320 Polymyositis, organ involvement unspecified: Secondary | ICD-10-CM

## 2023-03-08 NOTE — Patient Instructions (Signed)
 Medication Instructions:   Your physician recommends that you continue on your current medications as directed. Please refer to the Current Medication list given to you today.   *If you need a refill on your cardiac medications before your next appointment, please call your pharmacy*   Lab Work: Your physician recommends that you return for a FASTING lipid profile with your PCP fasting after midnight. Paperwork given to pt today.     If you have labs (blood work) drawn today and your tests are completely normal, you will receive your results only by: MyChart Message (if you have MyChart) OR A paper copy in the mail If you have any lab test that is abnormal or we need to change your treatment, we will call you to review the results.   Testing/Procedures:   None ordered.  Follow-Up: At [facility], you and your health needs are our priority.  As part of our continuing mission to provide you with exceptional heart care, we have created designated Provider Care Teams.  These Care Teams include your primary Cardiologist (physician) and Advanced Practice Providers (APPs -  Physician Assistants and Nurse Practitioners) who all work together to provide you with the care you need, when you need it.  We recommend signing up for the patient portal called "MyChart".  Sign up information is provided on this After Visit Summary.  MyChart is used to connect with patients for Virtual Visits (Telemedicine).  Patients are able to view lab/test results, encounter notes, upcoming appointments, etc.  Non-urgent messages can be sent to your provider as well.   To learn more about what you can do with MyChart, go to [email or web address].    Your next appointment:   6 month(s)  Provider:   Will be in touch.      Other Instructions    1st Floor: - Lobby - Registration  - Pharmacy  - Lab - Cafe  2nd Floor: - PV Lab - Diagnostic Testing (echo, CT, nuclear med)  3rd Floor: -  Vacant  4th Floor: - TCTS (cardiothoracic surgery) - AFib Clinic - Structural Heart Clinic - Vascular Surgery  - Vascular Ultrasound  5th Floor: - HeartCare Cardiology (general and EP) - Clinical Pharmacy for coumadin, hypertension, lipid, weight-loss medications, and med management appointments    Valet parking services will be available as well.

## 2023-04-07 ENCOUNTER — Ambulatory Visit: Payer: Medicare Other | Admitting: Nurse Practitioner

## 2023-04-15 ENCOUNTER — Ambulatory Visit: Payer: Medicare Other | Attending: Internal Medicine | Admitting: Pharmacist

## 2023-04-15 DIAGNOSIS — E785 Hyperlipidemia, unspecified: Secondary | ICD-10-CM | POA: Diagnosis present

## 2023-04-15 NOTE — Progress Notes (Signed)
 Patient ID: Brandy Haley                 DOB: 1955/11/17                    MRN: [id number]      HPI: Brandy Haley is a 68 y.o. female patient referred to lipid clinic by Brandy Bridegroom, NP. PMH is significant for CAD, polymyositis, mild MR, HTN, HLD, sinus tachycardia. Abnormal coronary CTA which led to LHC that revealed 80% stenosis first marginal treated with 2 overlapping drug-eluting stents requiring complex PCI and high grade lesion of prox first diagonal to be treated medically.   Seen by Brandy Haley in Feb for follow up. She has recently had a reaction to plavix and was switched to Effient. Newer cholesterol agents were discussed with her, but she declined due to her recently severe reaction with clopidogrel.   Patient presents to lipid clinic today.  Reports that her Polly myositis is not under control.  Her IVIG was recently increased and changed to every 3 weeks.  She has successfully decreased her prednisone from 20 mg daily to 15 mg daily.  She deals with chronic neck and back pain.  Her heart rate will increase upon minimal exertion.  Very easily fatigued which limits her exercise greatly.  Has issues with swallowing which limits her diet.  She is rightfully concerned about reaction to new medications given her history of medication reactions/intolerances.  Reviewed options for lowering LDL cholesterol, including ezetimibe, PCSK-9 inhibitors, bempedoic acid and inclisiran.  Discussed mechanisms of action, dosing, side effects and potential decreases in LDL cholesterol.  Also reviewed cost information and potential options for patient assistance.   Current Medications: none Intolerances: rosuvastatin, atorvastatin (muscle pains/increased LFT) Risk Factors: CAD, age, HTN LDL-C goal: <70 ApoB goal: <80  Diet:  Fish Liver pudding Green beans, butter beans Boost w/ pills Eggs, gritt  Exercise: very limited due to polymyositis  Family History:  Family History   Problem Relation Age of Onset   Cancer Mother        Throat and neck  died age 73   CAD Mother 51       Stents   Stroke Father        Died age 28   Breast cancer Neg Hx      Social History: never ETOH or tobacco  Labs: Lipid Panel  03/15/23 TC 277, TG 343, LDL-C 143 No results found for: "CHOL", "TRIG", "HDL", "CHOLHDL", "VLDL", "LDLCALC", "LDLDIRECT", "LABVLDL"  Past Medical History:  Diagnosis Date   Diverticulitis    Ruptured abscess   GERD (gastroesophageal reflux disease)    GI bleed    Hypercholesteremia    Hypertension     Current Outpatient Medications on File Prior to Visit  Medication Sig Dispense Refill   aspirin EC 81 MG tablet Take 81 mg by mouth daily. Swallow whole.     benazepril (LOTENSIN) 10 MG tablet Take 10 mg by mouth daily.     Cholecalciferol (VITAMIN D) 50 MCG (2000 UT) tablet Take 2,000 Units by mouth daily.     Collagen-Vitamin C-Biotin (COLLAGEN PO) Take 1 Scoop by mouth daily.     cyanocobalamin 1000 MCG tablet Take 1,000 mcg by mouth daily.     levothyroxine (SYNTHROID) 50 MCG tablet Take 50 mcg by mouth daily before breakfast.     Magnesium 400 MG TABS Take 400 mg by mouth daily.     metoprolol succinate (TOPROL XL)  25 MG 24 hr tablet Take 1 tablet (25 mg total) by mouth daily. 90 tablet 3   pantoprazole (PROTONIX) 40 MG tablet Take 40 mg by mouth daily as needed (heartburn).     prasugrel (EFFIENT) 10 MG TABS tablet Take 1 tablet (10 mg total) by mouth daily. 90 tablet 3   predniSONE (DELTASONE) 5 MG tablet Take 15 mg by mouth daily with breakfast.     nitroGLYCERIN (NITROSTAT) 0.4 MG SL tablet Place 1 tablet (0.4 mg total) under the tongue every 5 (five) minutes as needed. 25 tablet 2   oxyCODONE (OXY IR/ROXICODONE) 5 MG immediate release tablet Take 5 mg by mouth 3 (three) times daily as needed for severe pain (pain score 7-10). (Patient not taking: Reported on 04/15/2023)     No current facility-administered medications on file prior to  visit.    Allergies  Allergen Reactions   Clopidogrel Bisulfate Hives    Fevers, rash, chills-all resolved when medication stopped   Plavix [Clopidogrel] Rash   Cefdinir Nausea And Vomiting   Ciprofloxacin Hives   Infliximab     "Went into shock"   Pyridium [Phenazopyridine Hcl] Nausea And Vomiting   Statins Other (See Comments)    Patient has tried Crestor and Lipitor in the past. Experienced muscle pain with both medications. Additionally, LFTs increased so provider recommended discontinuing.   Tizanidine Other (See Comments)    Hypotension     Assessment/Plan:  1. Hyperlipidemia -  Hyperlipidemia Assessment: LDL-C above goal of less than 70 due to coronary artery disease Current LDL-C on no therapy 143 Patient with not very well-controlled polymyositis Her CK is much improved, has been up to over 2000 History of elevated LFTs and muscle pains with statins Statins are not a option for patient Zetia by itself would be ineffective We discussed PCSK9 versus Leqvio versus Nexletol In our clinical opinion Repatha would be the best option for patient given its ability to lower LDL-C and cardiovascular risk reduction data We did discuss low risk of muscle aches and back pain with Repatha We also discussed possibility of minor injection site reaction  Plan: Patient would like to think about her options and get back to [place] [employer] literature but appears PCSK9 to be safe in patients with polymyositis    Thank you,  Olene Floss, Pharm.D, BCACP, CPP [facility] [address] [place], [place] [address]  Phone: [phone]; Fax: [phone]

## 2023-04-15 NOTE — Assessment & Plan Note (Addendum)
 Assessment: LDL-C above goal of less than 70 due to coronary artery disease Current LDL-C on no therapy 143 Patient with not very well-controlled polymyositis Her CK is much improved, has been up to over 2000 History of elevated LFTs and muscle pains with statins Statins are not a option for patient Zetia by itself would be ineffective We discussed PCSK9 versus Leqvio versus Nexletol In our clinical opinion Repatha would be the best option for patient given its ability to lower LDL-C and cardiovascular risk reduction data We did discuss low risk of muscle aches and back pain with Repatha We also discussed possibility of minor injection site reaction  Plan: Patient would like to think about her options and get back to [place] [employer] literature but appears PCSK9 to be safe in patients with polymyositis

## 2023-05-09 LAB — SURGICAL PATHOLOGY

## 2023-05-20 ENCOUNTER — Telehealth (HOSPITAL_BASED_OUTPATIENT_CLINIC_OR_DEPARTMENT_OTHER): Payer: Self-pay | Admitting: *Deleted

## 2023-05-20 ENCOUNTER — Other Ambulatory Visit: Payer: Self-pay | Admitting: Internal Medicine

## 2023-05-20 DIAGNOSIS — S32010A Wedge compression fracture of first lumbar vertebra, initial encounter for closed fracture: Secondary | ICD-10-CM

## 2023-05-20 NOTE — Telephone Encounter (Signed)
 I s/w Vicky with [facility] and went over the recommendations from preop APP in regard to recent stents placed. Vicky aware procedure cannot be any sooner than 08/17/23. I did request new request when ready to reschedule. I will fax notes to [facility] for the chart there.

## 2023-05-20 NOTE — Telephone Encounter (Signed)
   Pre-operative Risk Assessment    Patient Name: Brandy Haley  DOB: 1955/04/09 MRN: [id number]   Date of last office visit: 03/08/2023 Date of next office visit: None  Request for Surgical Clearance    Procedure:  Kyphoplasty  Date of Surgery:  Clearance 05/24/23                                 Surgeon:  [facility] Surgeon's Group or Practice Name:  [facility] Phone number:  [phone] Fax number:  [phone]   Type of Clearance Requested:   - Medical  - Pharmacy:  Hold Aspirin  and Prasugrel  (Effient ) 7  days prior   Type of Anesthesia:  Not Indicated   Additional requests/questions:    Signed, Selina Palu   05/20/2023, 11:05 AM

## 2023-05-20 NOTE — Telephone Encounter (Signed)
 [facility] will tell pt to make sure to not stop her blood thinners at this time

## 2023-05-24 ENCOUNTER — Other Ambulatory Visit

## 2023-07-27 ENCOUNTER — Encounter (HOSPITAL_BASED_OUTPATIENT_CLINIC_OR_DEPARTMENT_OTHER): Payer: Self-pay

## 2023-11-02 ENCOUNTER — Telehealth: Payer: Self-pay | Admitting: Cardiovascular Disease

## 2023-11-02 MED ORDER — METOPROLOL SUCCINATE ER 25 MG PO TB24: 25.0000 mg | ORAL_TABLET | Freq: Every day | ORAL | 0 refills | Status: DC

## 2023-11-02 NOTE — Telephone Encounter (Signed)
*  STAT* If patient is at the pharmacy, call can be transferred to refill team.   1. Which medications need to be refilled? (please list name of each medication and dose if known) metoprolol  succinate (TOPROL  XL) 25 MG 24 hr tablet    2. Would you like to learn more about the convenience, safety, & potential cost savings by using the [facility]? No   3. Are you open to using the [facility] (Type [facility]. ) No   4. Which pharmacy/location (including street and city if local pharmacy) is medication to be sent to? [id number] - [place], [place] - [address]    5. Do they need a 30 day or 90 day supply? 90 day  Pt is out of medication

## 2023-11-02 NOTE — Telephone Encounter (Signed)
 RX sent in

## 2023-12-08 ENCOUNTER — Other Ambulatory Visit: Payer: Self-pay | Admitting: Internal Medicine

## 2023-12-08 DIAGNOSIS — Z1231 Encounter for screening mammogram for malignant neoplasm of breast: Secondary | ICD-10-CM

## 2023-12-12 ENCOUNTER — Ambulatory Visit: Attending: Cardiovascular Disease | Admitting: Cardiovascular Disease

## 2023-12-12 ENCOUNTER — Encounter: Payer: Self-pay | Admitting: Cardiovascular Disease

## 2023-12-12 VITALS — BP 110/82 | HR 91 | Ht 62.0 in | Wt 134.8 lb

## 2023-12-12 DIAGNOSIS — I25118 Atherosclerotic heart disease of native coronary artery with other forms of angina pectoris: Secondary | ICD-10-CM | POA: Insufficient documentation

## 2023-12-12 DIAGNOSIS — E782 Mixed hyperlipidemia: Secondary | ICD-10-CM | POA: Insufficient documentation

## 2023-12-12 DIAGNOSIS — I350 Nonrheumatic aortic (valve) stenosis: Secondary | ICD-10-CM | POA: Diagnosis not present

## 2023-12-12 DIAGNOSIS — I10 Essential (primary) hypertension: Secondary | ICD-10-CM | POA: Insufficient documentation

## 2023-12-12 MED ORDER — ISOSORBIDE MONONITRATE ER 30 MG PO TB24: 15.0000 mg | ORAL_TABLET | Freq: Every day | ORAL | 3 refills | Status: AC

## 2023-12-12 NOTE — Progress Notes (Signed)
 Cardiology Office Note:    Date:  12/12/2023   ID:  Brandy Haley, DOB 22-Jun-1955, MRN [id number]  PCP:  Sherial Bail, MD   [facility] Providers Cardiologist:  Aleene Passe, MD (Inactive)     Referring MD: Sherial Bail, MD   Chief Complaint  Patient presents with   Coronary Artery Disease    History of Present Illness:    Brandy Haley is a 68 y.o. female with a hx of:  Coronary artery disease CT calcium  score 06/13/2022 CAC score 1108 (98th percentile) LM 0, LAD 368, LCx 195, RCA 545 Coronary CTA 02/14/23 LHC 02/17/23 with severe OM stenosis, treated with overlapping drug-eluting stents Hypothyroidism Occlusion of left vertebral artery s/p neck surgery Polymyositis ILD Valve disease Mild MR  The patient has been followed by Dr. Passe.  I will be seeing her in Dr. Allena retirement. She is here alone today. She has a long history of psoriatic arthritis, then developed polymyositis. She has developed progressive decline with muscle weakness, weight loss. She underwent PCI 02/17/23 with recommendations for 6 months DAPT, but had an allergic reaction to clopidogrel  and has been maintained on DAPT with ASA and effient . She reports very easy bruising.  The patient admits to chest discomfort sometimes at rest and other times with activity.  She has mild shortness of breath but no significant limitation.  No edema, heart palpitations, orthopnea, or PND.  Current Medications: Current Meds  Medication Sig   alendronate (FOSAMAX) 70 MG tablet Take 70 mg by mouth once a week.   aspirin  EC 81 MG tablet Take 81 mg by mouth daily. Swallow whole.   benazepril  (LOTENSIN ) 10 MG tablet Take 10 mg by mouth daily.   Cholecalciferol (VITAMIN D ) 50 MCG (2000 UT) tablet Take 2,000 Units by mouth daily.   Collagen-Vitamin C-Biotin (COLLAGEN PO) Take 1 Scoop by mouth daily.   cyanocobalamin 1000 MCG tablet Take 1,000 mcg by mouth daily.   isosorbide  mononitrate (IMDUR )  30 MG 24 hr tablet Take 0.5 tablets (15 mg total) by mouth daily.   levothyroxine (SYNTHROID) 50 MCG tablet Take 50 mcg by mouth daily before breakfast.   Magnesium 400 MG TABS Take 400 mg by mouth daily.   metoprolol  succinate (TOPROL  XL) 25 MG 24 hr tablet Take 1 tablet (25 mg total) by mouth daily.   nitroGLYCERIN  (NITROSTAT ) 0.4 MG SL tablet Place 1 tablet (0.4 mg total) under the tongue every 5 (five) minutes as needed.   oxyCODONE  (OXY IR/ROXICODONE ) 5 MG immediate release tablet Take 5 mg by mouth 3 (three) times daily as needed for severe pain (pain score 7-10).   pantoprazole  (PROTONIX ) 40 MG tablet Take 40 mg by mouth daily as needed (heartburn).   predniSONE  (DELTASONE ) 5 MG tablet Take 15 mg by mouth daily with breakfast. (Patient taking differently: Take 30 mg by mouth daily with breakfast.)   [DISCONTINUED] prasugrel  (EFFIENT ) 10 MG TABS tablet Take 1 tablet (10 mg total) by mouth daily.     Allergies:   Clopidogrel  bisulfate, Plavix  [clopidogrel ], Cefdinir, Ciprofloxacin , Infliximab, Pyridium [phenazopyridine hcl], Statins, and Tizanidine   ROS:   Please see the history of present illness.    All other systems reviewed and are negative.  EKGs/Labs/Other Studies Reviewed:    The following studies were reviewed today: Cardiac Studies & Procedures   ______________________________________________________________________________________________ CARDIAC CATHETERIZATION  CARDIAC CATHETERIZATION 02/17/2023  Conclusion   1st Mrg lesion is 80% stenosed.   1st Diag lesion is 80% stenosed.   A  stent was successfully placed.   A stent was successfully placed.   in the main branch and side branch.   Angioplasty was performed in the main branch and side branch. .   Post intervention, there is a 0% residual stenosis.  1.  High-grade obtuse marginal lesion treated with 2 overlapping drug-eluting stents requiring complex PCI with GuideLiner and multiple wires. 2.  High-grade proximal  lesion of first diagonal; this should be treated medically. 3.  Mild diffuse disease of dominant right coronary artery. 4.  LVEDP of 17 mmHg.  Recommendation: Dual antiplatelet therapy for 6 months then Plavix  monotherapy indefinitely.  Findings Coronary Findings Diagnostic  Dominance: Right  Left Anterior Descending  First Diagonal Branch 1st Diag lesion is 80% stenosed.  Left Circumflex  First Obtuse Marginal Branch 1st Mrg lesion is 80% stenosed.  Right Coronary Artery There is mild diffuse disease throughout the vessel.  Intervention  1st Mrg lesion Provisional (Also extends to segments: Prox Cx to Mid Cx) Provisional stenting was successfully performed in the main branch from the Prox Cx to the Mid Cx and in the side branch at the 1st Mrg. Pre-stent angioplasty was performed. A stent was successfully placed. Post-stent kissing balloon dilatation was performed in the main branch and side branch. Post-Intervention Lesion Assessment The intervention was successful. Pre-interventional TIMI flow is 3. Post-intervention TIMI flow is 3. There is a 0% residual stenosis post intervention.   STRESS TESTS  EXERCISE TOLERANCE TEST (ETT) 10/04/2017  Interpretation Summary  Blood pressure demonstrated a hypertensive response to exercise.  There was no ST segment deviation noted during stress.  Exercise treadmill with normal functional capacity (9:00); no chest pain; hypertensive blood pressure response; no ST changes (upsloping ST depression in inferior lateral leads noted and felt to be nondiagnostic); negative adequate exercise tolerance test.   ECHOCARDIOGRAM  ECHOCARDIOGRAM COMPLETE 09/09/2022  Narrative ECHOCARDIOGRAM REPORT    Patient Name:   Brandy Haley Date of Exam: 09/09/2022 Medical Rec #:  [id number]      Height:       62.0 in Accession #:    [id number]     Weight:       97.2 lb Date of Birth:  August 07, 1955     BSA:          1.407 m Patient Age:    66 years        BP:           122/78 mmHg Patient Gender: F              HR:           94 bpm. Exam Location:  [address]  Procedure: 2D Echo, Cardiac Doppler and Color Doppler  Indications:    R01.1 Murmur  History:        Patient has no prior history of Echocardiogram examinations. Signs/Symptoms:Murmur; Risk Factors:Hypertension, Family History of Coronary Artery Disease and Dyslipidemia.  Sonographer:    Heather Hawks RDCS Referring Phys: LAVENIA BEAVER  IMPRESSIONS   1. Left ventricular ejection fraction, by estimation, is 60 to 65%. The left ventricle has normal function. The left ventricle has no regional wall motion abnormalities. Left ventricular diastolic parameters are indeterminate. 2. Right ventricular systolic function is normal. The right ventricular size is normal. 3. The mitral valve is normal in structure. Mild mitral valve regurgitation. No evidence of mitral stenosis. 4. The aortic valve is normal in structure. Aortic valve regurgitation is mild. Aortic valve sclerosis is present, with no evidence of  aortic valve stenosis. 5. The inferior vena cava is normal in size with greater than 50% respiratory variability, suggesting right atrial pressure of 3 mmHg.  FINDINGS Left Ventricle: Left ventricular ejection fraction, by estimation, is 60 to 65%. The left ventricle has normal function. The left ventricle has no regional wall motion abnormalities. The left ventricular internal cavity size was normal in size. There is no left ventricular hypertrophy. Left ventricular diastolic parameters are indeterminate.  Right Ventricle: The right ventricular size is normal. No increase in right ventricular wall thickness. Right ventricular systolic function is normal.  Left Atrium: Left atrial size was normal in size.  Right Atrium: Right atrial size was normal in size.  Pericardium: There is no evidence of pericardial effusion. Presence of epicardial fat layer.  Mitral Valve: The  mitral valve is normal in structure. Mild mitral valve regurgitation. No evidence of mitral valve stenosis.  Tricuspid Valve: The tricuspid valve is normal in structure. Tricuspid valve regurgitation is trivial. No evidence of tricuspid stenosis.  Aortic Valve: The aortic valve is normal in structure. Aortic valve regurgitation is mild. Aortic valve sclerosis is present, with no evidence of aortic valve stenosis. Aortic valve mean gradient measures 11.5 mmHg. Aortic valve peak gradient measures 21.1 mmHg. Aortic valve area, by VTI measures 2.35 cm.  Pulmonic Valve: The pulmonic valve was normal in structure. Pulmonic valve regurgitation is not visualized. No evidence of pulmonic stenosis.  Aorta: The aortic root is normal in size and structure.  Venous: The inferior vena cava is normal in size with greater than 50% respiratory variability, suggesting right atrial pressure of 3 mmHg.  IAS/Shunts: No atrial level shunt detected by color flow Doppler.   LEFT VENTRICLE PLAX 2D LVIDd:         2.90 cm   Diastology LVIDs:         1.75 cm   LV e' medial:    6.78 cm/s LV PW:         0.90 cm   LV E/e' medial:  9.0 LV IVS:        0.80 cm   LV e' lateral:   11.40 cm/s LVOT diam:     2.00 cm   LV E/e' lateral: 5.4 LV SV:         90 LV SV Index:   64 LVOT Area:     3.14 cm   RIGHT VENTRICLE             IVC RV Basal diam:  2.60 cm     IVC diam: 3.60 cm RV S prime:     21.70 cm/s TAPSE (M-mode): 1.7 cm RVSP:           15.0 mmHg  LEFT ATRIUM             Index        RIGHT ATRIUM           Index LA diam:        3.10 cm 2.20 cm/m   RA Pressure: 3.00 mmHg LA Vol (A2C):   36.6 ml 26.02 ml/m  RA Area:     12.60 cm LA Vol (A4C):   46.5 ml 33.05 ml/m  RA Volume:   28.40 ml  20.19 ml/m LA Biplane Vol: 41.8 ml 29.71 ml/m AORTIC VALVE AV Area (Vmax):    2.42 cm AV Area (Vmean):   2.45 cm AV Area (VTI):     2.35 cm AV Vmax:  229.50 cm/s AV Vmean:          156.000 cm/s AV VTI:             0.382 m AV Peak Grad:      21.1 mmHg AV Mean Grad:      11.5 mmHg LVOT Vmax:         177.00 cm/s LVOT Vmean:        121.867 cm/s LVOT VTI:          0.286 m LVOT/AV VTI ratio: 0.75  AORTA Ao Root diam: 2.90 cm Ao Asc diam:  2.80 cm  MITRAL VALVE                TRICUSPID VALVE MV Area (PHT): cm          TR Peak grad:   12.0 mmHg MV Decel Time: 305 msec     TR Vmax:        173.00 cm/s MV E velocity: 61.30 cm/s   Estimated RAP:  3.00 mmHg MV A velocity: 116.00 cm/s  RVSP:           15.0 mmHg MV E/A ratio:  0.53 SHUNTS Systemic VTI:  0.29 m Systemic Diam: 2.00 cm  Kardie Tobb DO Electronically signed by Dub Huntsman DO Signature Date/Time: 09/09/2022/8:11:57 PM    Final      CT SCANS  CT CORONARY MORPH W/CTA COR W/SCORE 02/14/2023  Addendum 02/21/2023 10:31 PM ADDENDUM REPORT: 02/21/2023 22:29  EXAM: OVER-READ INTERPRETATION  CT CHEST  The following report is an over-read performed by radiologist Dr. Suzen Dials of [facility] on 02/21/2023. This over-read does not include interpretation of cardiac or coronary anatomy or pathology. The coronary calcium  score/coronary CTA interpretation by the cardiologist is attached.  COMPARISON:  Jun 08, 2022  FINDINGS: Cardiovascular: There are no significant extracardiac vascular findings.  Mediastinum/Nodes: There are no enlarged lymph nodes within the visualized mediastinum.  Lungs/Pleura: There is no pleural effusion. Mild to moderate severity atelectatic changes are seen within the posterior aspect of the bilateral lower lobes.  Upper abdomen: No significant findings in the visualized upper abdomen.  Musculoskeletal/Chest wall: No chest wall mass or suspicious osseous findings within the visualized chest.  IMPRESSION: No significant extracardiac findings within the visualized chest.   Electronically Signed By: Suzen Dials M.D. On: 02/21/2023 22:29  Narrative CLINICAL DATA:   Coronary calcium  score  EXAM: Cardiac/Coronary  CTA  TECHNIQUE: The patient was scanned on a Siemens Somatom go.Top scanner.  : A retrospective scan was triggered in the ascending thoracic aorta. Axial non-contrast 3 mm slices were carried out through the heart. The data set was analyzed on a dedicated work station and scored using the Agatson method. Gantry rotation speed was 330 msecs and collimation was .6 mm. 50mg  of metoprolol  and 0.8 mg of sl NTG was given. The 3D data set was reconstructed in 5% intervals of the 60-95 % of the R-R cycle. Diastolic phases were analyzed on a dedicated work station using MPR, MIP and VRT modes. The patient received 80 cc of contrast.  FINDINGS: Aorta:  Normal size.  No calcifications.  No dissection.  Aortic Valve:  Trileaflet. Mild calcifications.  Coronary Arteries:  Normal coronary origin.  Right dominance.  RCA is a dominant artery. There is calcified plaque proximally causing mild stenosis (25-49%).  Left main gives rise to LAD and LCX arteries. LM has no disease.  LAD has calcified plaque proximally causing moderate stenosis (50-69%).  LCX is a  non-dominant artery. There is calcified and non-calcified plaque proximally causing severe stenosis (>70%).  Other findings:  Normal pulmonary vein drainage into the left atrium.  Normal left atrial appendage without a thrombus.  Normal size of the pulmonary artery.  IMPRESSION: 1. Coronary calcium  score of 1334. This was 98th percentile for age and sex matched control.  2. Normal coronary origin with right dominance.  3. Severe proximal LCx stenosis (>70%).  4. Moderate proximal LAD stenosis (50-69%).  5. CAD-RADS 4 Severe stenosis. (70-99% or > 50% left main). Cardiac catheterization is recommended. Consider symptom-guided anti-ischemic pharmacotherapy as well as risk factor modification per guideline directed care.  6. Additional analysis with CT FFR will be submitted  and reported separately.  Electronically Signed: By: Redell Cave M.D. On: 02/14/2023 13:28   CT SCANS  CT CARDIAC SCORING (SELF PAY ONLY) 06/08/2022  Addendum 06/13/2022 11:15 PM ADDENDUM REPORT: 06/13/2022 23:13  EXAM: OVER-READ INTERPRETATION  CT CHEST  The following report is an over-read performed by radiologist Dr. Andrea Gasman of [facility] on 06/13/2022. This over-read does not include interpretation of cardiac or coronary anatomy or pathology. The coronary calcium  score interpretation by the cardiologist is attached.  COMPARISON:  Chest CT 01/08/2019  FINDINGS: Vascular: No aortic atherosclerosis. The included aorta is normal in caliber.  Mediastinum/nodes: The prominent subcarinal node on prior exam is difficult to delineate from the esophagus currently. Mild wall thickening of the distal esophagus, chronic.  Lungs: Chronic interstitial thickening and ground-glass in the lung bases, with slight progression from 2020 exam. Unchanged 3 mm left upper lobe nodule series 10, image 34, stability consistent with benign process, noting no further imaging follow-up. No pleural fluid. The included airways are patent.  Upper abdomen: No acute or unexpected findings.  Musculoskeletal: There are no acute or suspicious osseous abnormalities.  IMPRESSION: 1. Chronic lung base findings since 2020 that may represent scarring or interstitial lung disease. 2. Benign left upper lobe pulmonary nodule, needing no further imaging follow-up per Fleischner society guidelines.   Electronically Signed By: Andrea Gasman M.D. On: 06/13/2022 23:13  Narrative CLINICAL DATA:  Risk stratification  EXAM: Coronary Calcium  Score  TECHNIQUE: The patient was scanned on a Siemens Somatom scanner. Axial non-contrast 3 mm slices were carried out through the heart. The data set was analyzed on a dedicated work station and scored using the Agatson  method.  FINDINGS: Non-cardiac: See separate report from [facility].  Ascending Aorta: Normal size  Pericardium: Normal  Coronary arteries: Normal origin of left and right coronary arteries. Distribution of arterial calcifications if present, as noted below;  LM 0  LAD 368  LCx 195  RCA 545  Total 1108  IMPRESSION AND RECOMMENDATION: 1. Coronary calcium  score of 1108. This was 98th percentile for age and sex matched control.  2. CAC >300 in LAD, LCx, RCA. CAC-DRS A3/N3.  3. Recommend aspirin  and statin if no contraindication.  4. Recommend cardiology consultation.  5. Continue heart healthy lifestyle and risk factor modification.  Electronically Signed: By: Redell Cave M.D. On: 06/09/2022 12:05     ______________________________________________________________________________________________      EKG:        Recent Labs: 02/16/2023: BUN 19; Creatinine, Ser 0.37; Potassium 4.5; Sodium 132  Recent Lipid Panel No results found for: CHOL, TRIG, HDL, CHOLHDL, VLDL, LDLCALC, LDLDIRECT   Risk Assessment/Calculations:                Physical Exam:    VS:  BP 110/82 (BP  Location: Left Arm, Patient Position: Sitting, Cuff Size: Normal)   Pulse 91   Ht 5' 2 (1.575 m)   Wt 134 lb 12.8 oz (61.1 kg)   SpO2 96%   BMI 24.66 kg/m     Wt Readings from Last 3 Encounters:  12/12/23 134 lb 12.8 oz (61.1 kg)  03/08/23 116 lb 12.8 oz (53 kg)  02/17/23 117 lb (53.1 kg)     GEN:  Well nourished, well developed in no acute distress HEENT: Normal NECK: No JVD; No carotid bruits LYMPHATICS: No lymphadenopathy CARDIAC: RRR, 3/6 crescendo decrescendo murmur at the right upper sternal border RESPIRATORY:  Clear to auscultation without rales, wheezing or rhonchi  ABDOMEN: Soft, non-tender, non-distended MUSCULOSKELETAL:  No edema; No deformity  SKIN: Warm and dry NEUROLOGIC:  Alert and oriented x 3 PSYCHIATRIC:  Normal affect    Assessment & Plan Coronary artery disease of native artery of native heart with stable angina pectoris Now > 6 months out from elective PCI. Stop prasugrel  per guidelines. Continue ASA 81 mg daily. Add isosorbide  15 mg daily.  Nonrheumatic aortic valve stenosis Past history of aortic sclerosis noted.  Echo from [facility] system reviewed and described moderate aortic stenosis with peak transaortic velocity of 3.4 m/s.  Recommend follow-up echo in 6 months with an office visit to review results. Primary hypertension Well-controlled on benazepril  and metoprolol  succinate. Sometimes she has low BP per her report but denies associated symptoms. Continue current Rx.  Mixed hyperlipidemia Statin intolerant. Not on any lipid-lowering therapies. Reviewed PharmD notes from our lipid clinic. With her polymyositis and degree of disability, will defer treatment for now. LDL 143.      Medication Adjustments/Labs and Tests Ordered: Current medicines are reviewed at length with the patient today.  Concerns regarding medicines are outlined above.  Orders Placed This Encounter  Procedures   ECHOCARDIOGRAM COMPLETE   Meds ordered this encounter  Medications   isosorbide  mononitrate (IMDUR ) 30 MG 24 hr tablet    Sig: Take 0.5 tablets (15 mg total) by mouth daily.    Dispense:  30 tablet    Refill:  3    Patient Instructions  Medication Instructions:  STOP Prasugrel  (Effient )   START Isosorbide  Mononitrate (Imdur ) 15 mg once daily   *If you need a refill on your cardiac medications before your next appointment, please call your pharmacy*  Lab Work: None ordered today. If you have labs (blood work) drawn today and your tests are completely normal, you will receive your results only by: MyChart Message (if you have MyChart) OR A paper copy in the mail If you have any lab test that is abnormal or we need to change your treatment, we will call you to review the  results.  Testing/Procedures: Your physician has requested that you have an echocardiogram prior to 6 month follow-up with Dr. Wonda. Echocardiography is a painless test that uses sound waves to create images of your heart. It provides your doctor with information about the size and shape of your heart and how well your heart's chambers and valves are working. This procedure takes approximately one hour. There are no restrictions for this procedure. Please do NOT wear cologne, perfume, aftershave, or lotions (deodorant is allowed). Please arrive 15 minutes prior to your appointment time.  Please note: We ask at that you not bring children with you during ultrasound (echo/ vascular) testing. Due to room size and safety concerns, children are not allowed in the ultrasound rooms during exams. Our front  office staff cannot provide observation of children in our lobby area while testing is being conducted. An adult accompanying a patient to their appointment will only be allowed in the ultrasound room at the discretion of the ultrasound technician under special circumstances. We apologize for any inconvenience.   Follow-Up: At [facility], you and your health needs are our priority.  As part of our continuing mission to provide you with exceptional heart care, our providers are all part of one team.  This team includes your primary Cardiologist (physician) and Advanced Practice Providers or APPs (Physician Assistants and Nurse Practitioners) who all work together to provide you with the care you need, when you need it.  Your next appointment:   6 month(s)  Provider:   Ozell Fell, MD      Signed, Ozell Fell, MD  12/12/2023 12:04 PM    [facility]

## 2023-12-12 NOTE — Assessment & Plan Note (Signed)
 Statin intolerant. Not on any lipid-lowering therapies. Reviewed PharmD notes from our lipid clinic. With her polymyositis and degree of disability, will defer treatment for now. LDL 143.

## 2023-12-12 NOTE — Patient Instructions (Addendum)
 Medication Instructions:  STOP Prasugrel  (Effient )   START Isosorbide  Mononitrate (Imdur ) 15 mg once daily   *If you need a refill on your cardiac medications before your next appointment, please call your pharmacy*  Lab Work: None ordered today. If you have labs (blood work) drawn today and your tests are completely normal, you will receive your results only by: MyChart Message (if you have MyChart) OR A paper copy in the mail If you have any lab test that is abnormal or we need to change your treatment, we will call you to review the results.  Testing/Procedures: Your physician has requested that you have an echocardiogram prior to 6 month follow-up with Dr. Wonda. Echocardiography is a painless test that uses sound waves to create images of your heart. It provides your doctor with information about the size and shape of your heart and how well your heart's chambers and valves are working. This procedure takes approximately one hour. There are no restrictions for this procedure. Please do NOT wear cologne, perfume, aftershave, or lotions (deodorant is allowed). Please arrive 15 minutes prior to your appointment time.  Please note: We ask at that you not bring children with you during ultrasound (echo/ vascular) testing. Due to room size and safety concerns, children are not allowed in the ultrasound rooms during exams. Our front office staff cannot provide observation of children in our lobby area while testing is being conducted. An adult accompanying a patient to their appointment will only be allowed in the ultrasound room at the discretion of the ultrasound technician under special circumstances. We apologize for any inconvenience.   Follow-Up: At [facility], you and your health needs are our priority.  As part of our continuing mission to provide you with exceptional heart care, our providers are all part of one team.  This team includes your primary Cardiologist (physician)  and Advanced Practice Providers or APPs (Physician Assistants and Nurse Practitioners) who all work together to provide you with the care you need, when you need it.  Your next appointment:   6 month(s)  Provider:   Ozell Wonda, MD

## 2023-12-12 NOTE — Assessment & Plan Note (Addendum)
 Now > 6 months out from elective PCI. Stop prasugrel  per guidelines. Continue ASA 81 mg daily. Add isosorbide  15 mg daily.

## 2024-01-25 ENCOUNTER — Other Ambulatory Visit: Payer: Self-pay | Admitting: Nurse Practitioner

## 2024-04-23 ENCOUNTER — Ambulatory Visit (HOSPITAL_COMMUNITY)
